# Patient Record
Sex: Female | Born: 1970 | Race: White | Hispanic: No | Marital: Married | Smoking: Never smoker
Health system: Southern US, Community
[De-identification: ages and names within clinical notes are randomized; demographics above are authoritative.]

## PROBLEM LIST (undated history)

## (undated) DIAGNOSIS — K219 Gastro-esophageal reflux disease without esophagitis: Secondary | ICD-10-CM

## (undated) DIAGNOSIS — K5900 Constipation, unspecified: Secondary | ICD-10-CM

## (undated) DIAGNOSIS — J302 Other seasonal allergic rhinitis: Secondary | ICD-10-CM

## (undated) DIAGNOSIS — Z9889 Other specified postprocedural states: Secondary | ICD-10-CM

## (undated) DIAGNOSIS — F419 Anxiety disorder, unspecified: Secondary | ICD-10-CM

## (undated) DIAGNOSIS — R112 Nausea with vomiting, unspecified: Secondary | ICD-10-CM

## (undated) DIAGNOSIS — K589 Irritable bowel syndrome without diarrhea: Secondary | ICD-10-CM

## (undated) DIAGNOSIS — T7840XA Allergy, unspecified, initial encounter: Secondary | ICD-10-CM

## (undated) HISTORY — PX: ABDOMINAL HYSTERECTOMY: SHX81

## (undated) HISTORY — DX: Other specified postprocedural states: R11.2

## (undated) HISTORY — DX: Other specified postprocedural states: Z98.890

## (undated) HISTORY — PX: COLONOSCOPY: SHX174

## (undated) HISTORY — PX: WISDOM TOOTH EXTRACTION: SHX21

## (undated) HISTORY — DX: Constipation, unspecified: K59.00

## (undated) HISTORY — DX: Allergy, unspecified, initial encounter: T78.40XA

## (undated) HISTORY — DX: Irritable bowel syndrome, unspecified: K58.9

## (undated) HISTORY — DX: Gastro-esophageal reflux disease without esophagitis: K21.9

## (undated) HISTORY — PX: TOTAL ABDOMINAL HYSTERECTOMY: SHX209

---

## 2001-07-13 ENCOUNTER — Emergency Department (HOSPITAL_COMMUNITY): Admission: EM | Admit: 2001-07-13 | Discharge: 2001-07-14 | Payer: Self-pay | Admitting: Emergency Medicine

## 2001-07-14 ENCOUNTER — Encounter: Payer: Self-pay | Admitting: Emergency Medicine

## 2002-06-01 ENCOUNTER — Emergency Department (HOSPITAL_COMMUNITY): Admission: EM | Admit: 2002-06-01 | Discharge: 2002-06-01 | Payer: Self-pay | Admitting: Emergency Medicine

## 2002-06-01 ENCOUNTER — Encounter: Payer: Self-pay | Admitting: Emergency Medicine

## 2015-02-28 ENCOUNTER — Encounter: Payer: Self-pay | Admitting: Physician Assistant

## 2015-02-28 ENCOUNTER — Ambulatory Visit (INDEPENDENT_AMBULATORY_CARE_PROVIDER_SITE_OTHER): Payer: Managed Care, Other (non HMO) | Admitting: Physician Assistant

## 2015-02-28 VITALS — BP 122/68 | HR 67 | Ht 65.5 in | Wt 175.0 lb

## 2015-02-28 DIAGNOSIS — Z131 Encounter for screening for diabetes mellitus: Secondary | ICD-10-CM | POA: Diagnosis not present

## 2015-02-28 DIAGNOSIS — Z23 Encounter for immunization: Secondary | ICD-10-CM | POA: Diagnosis not present

## 2015-02-28 DIAGNOSIS — Z0000 Encounter for general adult medical examination without abnormal findings: Secondary | ICD-10-CM | POA: Diagnosis not present

## 2015-02-28 DIAGNOSIS — Z13220 Encounter for screening for lipoid disorders: Secondary | ICD-10-CM | POA: Diagnosis not present

## 2015-02-28 DIAGNOSIS — R252 Cramp and spasm: Secondary | ICD-10-CM

## 2015-02-28 DIAGNOSIS — R10813 Right lower quadrant abdominal tenderness: Secondary | ICD-10-CM

## 2015-02-28 DIAGNOSIS — Z1239 Encounter for other screening for malignant neoplasm of breast: Secondary | ICD-10-CM

## 2015-02-28 NOTE — Progress Notes (Signed)
  Subjective:     Anna Robinson is a 44 y.o. female and is here for a comprehensive physical exam. The patient reports problems - pt has some occasional muscle cramps of lower extremities. not tried anything. drinks at least 1 gallon of water a day. she does exercise a lot. [name].  History   Social History  . Marital Status: Legally Separated    Spouse Name: N/A  . Number of Children: N/A  . Years of Education: N/A   Occupational History  . Not on file.   Social History Main Topics  . Smoking status: Never Smoker   . Smokeless tobacco: Not on file  . Alcohol Use: Not on file  . Drug Use: No  . Sexual Activity:    Partners: Female   Other Topics Concern  . Not on file   Social History Narrative  . No narrative on file   Health Maintenance  Topic Date Due  . HIV Screening  11/06/1984  . MAMMOGRAM  11/07/1987  . PAP SMEAR  11/07/1987  . INFLUENZA VACCINE  08/31/2015 (Originally 07/15/2014)  . TETANUS/TDAP  02/27/2025    The following portions of the patient's history were reviewed and updated as appropriate: allergies, current medications, past family history, past medical history, past social history, past surgical history and problem list.  Review of Systems A comprehensive review of systems was negative.   Objective:    BP 122/68 mmHg  Pulse 67  Ht 5' 5.5" (1.664 m)  Wt 175 lb (79.379 kg)  BMI 28.67 kg/m2 General appearance: alert, cooperative and appears stated age Head: Normocephalic, without obvious abnormality, atraumatic Eyes: conjunctivae/corneas clear. PERRL, EOM's intact. Fundi benign. Ears: normal TM's and external ear canals both ears Nose: Nares normal. Septum midline. Mucosa normal. No drainage or sinus tenderness. Throat: lips, mucosa, and tongue normal; teeth and gums normal Neck: no adenopathy, no carotid bruit, no JVD, supple, symmetrical, trachea midline and thyroid not enlarged, symmetric, no tenderness/mass/nodules Back: symmetric, no curvature. ROM  normal. No CVA tenderness. Lungs: clear to auscultation bilaterally Heart: regular rate and rhythm, S1, S2 normal, no murmur, click, rub or gallop Abdomen: soft, non-tender; bowel sounds normal; no masses,  no organomegaly tenderness over RLQ. No rebound or guarding.  Extremities: extremities normal, atraumatic, no cyanosis or edema Pulses: 2+ and symmetric Skin: Skin color, texture, turgor normal. No rashes or lesions Lymph nodes: Cervical, supraclavicular, and axillary nodes normal. Neurologic: Grossly normal    Assessment:    Healthy female exam.      Plan:    CPE- mammogram ordered. Tdap given today. Declined flu shot. Other vaccines up to date. Fasting labs ordered. Encouraged vitamin D800 units and Calcium 1200mg . Encouraged exercise.   RLQ tenderness- per pt off and on for 1 month. Nothing tried. Still has ovaries. S/P hysterectomy. Will get pelvic ultrasound for evaluation.  Muscle cramps-  Magnesium 500mg  daily.  Alka seltzer acute cramps epson salt soalks.  Will get labs as well.   See After Visit Summary for Counseling Recommendations

## 2015-02-28 NOTE — Patient Instructions (Addendum)
Will get mammogram.  Keeping You Healthy  Get These Tests 1. Blood Pressure- Have your blood pressure checked once a year by your health care provider.  Normal blood pressure is 120/80. 2. Weight- Have your body mass index (BMI) calculated to screen for obesity.  BMI is measure of body fat based on height and weight.  You can also calculate your own BMI at https://www.west-esparza.com/www.nhlbisupport.com/bmi/. 3. Cholesterol- Have your cholesterol checked every 5 years starting at age 420 then yearly starting at age 46. 4. Chlamydia, HIV, and other sexually transmitted diseases- Get screened every year until age 46, then within three months of each new sexual provider. 5. Pap Smear- Every 1-3 years; discuss with your health care provider. 6. Mammogram- Every year starting at age 46  Take these medicines  Calcium with Vitamin D-Your body needs 1200 mg of Calcium each day and [id number] IU of Vitamin D daily.  Your body can only absorb 500 mg of Calcium at a time so Calcium must be taken in 2 or 3 divided doses throughout the day.  Multivitamin with folic acid- Once daily if it is possible for you to become pregnant.  Get these Immunizations  Gardasil-Series of three doses; prevents HPV related illness such as genital warts and cervical cancer.  Menactra-Single dose; prevents meningitis.  Tetanus shot- Every 10 years.  Flu shot-Every year.  Take these steps 1. Do not smoke-Your healthcare provider can help you quit.  For tips on how to quit go to www.smokefree.gov or call 1-800 QUITNOW. 2. Be physically active- Exercise 5 days a week for at least 30 minutes.  If you are not already physically active, start slow and gradually work up to 30 minutes of moderate physical activity.  Examples of moderate activity include walking briskly, dancing, swimming, bicycling, etc. 3. Breast Cancer- A self breast exam every month is important for early detection of breast cancer.  For more information and instruction on self breast  exams, ask your healthcare provider or [place].eswww.womenshealth.gov/faq/breast-self-exam.cfm. 4. Eat a healthy diet- Eat a variety of healthy foods such as fruits, vegetables, whole grains, low fat milk, low fat cheeses, yogurt, lean meats, poultry and fish, beans, nuts, tofu, etc.  For more information go to www. Thenutritionsource.org 5. Drink alcohol in moderation- Limit alcohol intake to one drink or less per day. Never drink and drive. 6. Depression- Your emotional health is as important as your physical health.  If you're feeling down or losing interest in things you normally enjoy please talk to your healthcare provider about being screened for depression. 7. Dental visit- Brush and floss your teeth twice daily; visit your dentist twice a year. 8. Eye doctor- Get an eye exam at least every 2 years. 9. Helmet use- Always wear a helmet when riding a bicycle, motorcycle, rollerblading or skateboarding. 10. Safe sex- If you may be exposed to sexually transmitted infections, use a condom. 11. Seat belts- Seat belts can save your live; always wear one. 12. Smoke/Carbon Monoxide detectors- These detectors need to be installed on the appropriate level of your home. Replace batteries at least once a year. 13. Skin cancer- When out in the sun please cover up and use sunscreen 15 SPF or higher. 14. Violence- If anyone is threatening or hurting you, please tell your healthcare provider.      Magnesium 500mg  daily.  Alka seltzer acute cramps epson salt.

## 2015-03-01 LAB — CBC WITH DIFFERENTIAL/PLATELET
Basophils Absolute: 0.1 10*3/uL (ref 0.0–0.1)
Basophils Relative: 1 % (ref 0–1)
Eosinophils Absolute: 0.1 10*3/uL (ref 0.0–0.7)
Eosinophils Relative: 1 % (ref 0–5)
HCT: 43.5 % (ref 36.0–46.0)
Hemoglobin: 14.8 g/dL (ref 12.0–15.0)
Lymphocytes Relative: 40 % (ref 12–46)
Lymphs Abs: 3.2 10*3/uL (ref 0.7–4.0)
MCH: 29.8 pg (ref 26.0–34.0)
MCHC: 34 g/dL (ref 30.0–36.0)
MCV: 87.5 fL (ref 78.0–100.0)
MPV: 9.9 fL (ref 8.6–12.4)
Monocytes Absolute: 0.6 10*3/uL (ref 0.1–1.0)
Monocytes Relative: 7 % (ref 3–12)
Neutro Abs: 4.1 10*3/uL (ref 1.7–7.7)
Neutrophils Relative %: 51 % (ref 43–77)
Platelets: 256 10*3/uL (ref 150–400)
RBC: 4.97 MIL/uL (ref 3.87–5.11)
RDW: 13.9 % (ref 11.5–15.5)
WBC: 8 10*3/uL (ref 4.0–10.5)

## 2015-03-01 LAB — COMPLETE METABOLIC PANEL WITH GFR
ALT: 18 U/L (ref 0–35)
AST: 16 U/L (ref 0–37)
Albumin: 4.7 g/dL (ref 3.5–5.2)
Alkaline Phosphatase: 59 U/L (ref 39–117)
BUN: 10 mg/dL (ref 6–23)
CO2: 22 mEq/L (ref 19–32)
Calcium: 9 mg/dL (ref 8.4–10.5)
Chloride: 105 mEq/L (ref 96–112)
Creat: 0.78 mg/dL (ref 0.50–1.10)
GFR, Est African American: >89 mL/min
GFR, Est Non African American: >89 mL/min
Glucose, Bld: 95 mg/dL (ref 70–99)
Potassium: 3.7 mEq/L (ref 3.5–5.3)
Sodium: 137 mEq/L (ref 135–145)
Total Bilirubin: 1.7 mg/dL — ABNORMAL HIGH (ref 0.2–1.2)
Total Protein: 7.2 g/dL (ref 6.0–8.3)

## 2015-03-01 LAB — LIPID PANEL
Cholesterol: 172 mg/dL (ref 0–200)
HDL: 33 mg/dL — ABNORMAL LOW (ref >=46)
LDL Cholesterol: 116 mg/dL — ABNORMAL HIGH (ref 0–99)
Total CHOL/HDL Ratio: 5.2 Ratio
Triglycerides: 115 mg/dL (ref <150)
VLDL: 23 mg/dL (ref 0–40)

## 2015-03-01 LAB — TSH: TSH: 1.434 u[IU]/mL (ref 0.350–4.500)

## 2015-03-02 ENCOUNTER — Telehealth: Payer: Self-pay | Admitting: *Deleted

## 2015-03-02 DIAGNOSIS — R10813 Right lower quadrant abdominal tenderness: Secondary | ICD-10-CM | POA: Insufficient documentation

## 2015-03-02 DIAGNOSIS — R252 Cramp and spasm: Secondary | ICD-10-CM | POA: Insufficient documentation

## 2015-03-02 NOTE — Telephone Encounter (Signed)
-----   Message from Jomarie LongsJade L Breeback, [place]-C sent at 03/01/2015  8:11 PM EDT ----- Call pt: thyroid normal. Kidney, glucose, liver normal. HDL on low side increase with exercise. LDL goal under 100 but no risk factors look great. No anemia.

## 2015-03-02 NOTE — Telephone Encounter (Signed)
Called patient & gave lab results .[name].Direce

## 2015-03-20 ENCOUNTER — Other Ambulatory Visit: Payer: Self-pay | Admitting: Physician Assistant

## 2015-03-20 DIAGNOSIS — R102 Pelvic and perineal pain: Secondary | ICD-10-CM

## 2015-03-22 ENCOUNTER — Ambulatory Visit (INDEPENDENT_AMBULATORY_CARE_PROVIDER_SITE_OTHER): Payer: Managed Care, Other (non HMO)

## 2015-03-22 DIAGNOSIS — R102 Pelvic and perineal pain: Secondary | ICD-10-CM

## 2015-03-22 DIAGNOSIS — R10813 Right lower quadrant abdominal tenderness: Secondary | ICD-10-CM | POA: Diagnosis not present

## 2015-03-22 DIAGNOSIS — Z1231 Encounter for screening mammogram for malignant neoplasm of breast: Secondary | ICD-10-CM | POA: Diagnosis not present

## 2015-03-22 DIAGNOSIS — Z90710 Acquired absence of both cervix and uterus: Secondary | ICD-10-CM | POA: Diagnosis not present

## 2015-03-26 ENCOUNTER — Telehealth: Payer: Self-pay | Admitting: *Deleted

## 2015-03-26 NOTE — Telephone Encounter (Signed)
4/11 Anna Robinson called and left msg to return call. Note is under ulstrasound transvaginal.

## 2015-03-26 NOTE — Telephone Encounter (Signed)
Patient called looking for results on Radiology results & Pap.  Amber  & I didn't see any notes available to report.

## 2015-03-27 ENCOUNTER — Telehealth: Payer: Self-pay | Admitting: *Deleted

## 2015-03-27 NOTE — Telephone Encounter (Signed)
Spoke with  pt: ovaries look great. Nothing suspicious on pelvic ultrasound.

## 2015-07-09 ENCOUNTER — Encounter: Payer: Self-pay | Admitting: Emergency Medicine

## 2015-07-09 ENCOUNTER — Emergency Department (INDEPENDENT_AMBULATORY_CARE_PROVIDER_SITE_OTHER)
Admission: EM | Admit: 2015-07-09 | Discharge: 2015-07-09 | Disposition: A | Discharge status: Home / Self Care | Payer: Managed Care, Other (non HMO) | Source: Home / Self Care | Attending: Family Medicine | Admitting: Family Medicine

## 2015-07-09 DIAGNOSIS — R51 Headache: Secondary | ICD-10-CM

## 2015-07-09 DIAGNOSIS — R42 Dizziness and giddiness: Secondary | ICD-10-CM | POA: Diagnosis not present

## 2015-07-09 DIAGNOSIS — R519 Headache, unspecified: Secondary | ICD-10-CM

## 2015-07-09 DIAGNOSIS — F419 Anxiety disorder, unspecified: Secondary | ICD-10-CM | POA: Diagnosis not present

## 2015-07-09 HISTORY — DX: Other seasonal allergic rhinitis: J30.2

## 2015-07-09 HISTORY — DX: Anxiety disorder, unspecified: F41.9

## 2015-07-09 LAB — POCT FASTING CBG KUC MANUAL ENTRY: POCT Glucose (KUC): 124 mg/dL — AB (ref 70–99)

## 2015-07-09 MED ORDER — DEXAMETHASONE SODIUM PHOSPHATE 4 MG/ML IJ SOLN
8.0000 mg | Freq: Once | INTRAMUSCULAR | Status: AC
  Administered 2015-07-09: 8 mg via INTRAMUSCULAR

## 2015-07-09 MED ORDER — METOCLOPRAMIDE HCL 5 MG/ML IJ SOLN
10.0000 mg | Freq: Once | INTRAMUSCULAR | Status: AC
  Administered 2015-07-09: 10 mg via INTRAMUSCULAR

## 2015-07-09 NOTE — ED Provider Notes (Signed)
CSN: [id number]     Arrival date & time 07/09/15  1236 History   First MD Initiated Contact with Patient 07/09/15 1318     Chief Complaint  Patient presents with  . Headache  . Dizziness   (Consider location/radiation/quality/duration/timing/severity/associated sxs/prior Treatment) HPI Patient is a 44 year old female with history of seasonal allergies and anxiety presenting to urgent care with complaint of headache that started this morning when she woke up.  Patient states headache is diffuse and achy, moderate in severity with associated nausea, photophobia, and mild dizziness.  Headache feels similar to prior headaches, however is more persistent.  Patient has tried 2 tablets of Excedrin as well as 25 mg of Benadryl. Patient states she knows she has history of panic attacks and has become more anxious since going to her work where people were asking how she was doing.  Patient does report a fall 2 weeks ago due to leg cramps, however denies hitting her head.  States she was evaluated at that time started on Alka-Seltzer which did help with her leg cramps and headache had resolved at that time.  Patient states she does get frequent, almost daily headaches, but has not seen a neurologist for these "migraines."  She is not on any preventative migraine medication.  Denies weakness or numbness in arms or legs.  Denies difficulty walking.  Neck pain or stiffness.  Denies rashes.  Past Medical History  Diagnosis Date  . Seasonal allergies   . Anxiety    Past Surgical History  Procedure Laterality Date  . Abdominal hysterectomy     Family History  Problem Relation Age of Onset  . Heart disease Mother   . Diabetes Mother   . Hyperlipidemia Mother   . Hypertension Mother   . Stroke Mother   . Cancer Father   . Depression Maternal Aunt   . Heart disease Maternal Grandmother    History  Substance Use Topics  . Smoking status: Never Smoker   . Smokeless tobacco: Not on file  . Alcohol Use:  Not on file   OB History    No data available     Review of Systems  Constitutional: Negative for fever and chills.  HENT: Positive for congestion, ear pain ("congested" ), rhinorrhea, sinus pressure and sneezing. Negative for sore throat, trouble swallowing and voice change.   Respiratory: Positive for chest tightness. Negative for cough and shortness of breath.   Cardiovascular: Negative for chest pain and palpitations.  Gastrointestinal: Positive for nausea. Negative for vomiting, abdominal pain and diarrhea.  Genitourinary: Negative for dysuria, urgency, hematuria and flank pain.  Musculoskeletal: Positive for myalgias and arthralgias. Negative for back pain, joint swelling, gait problem, neck pain and neck stiffness.       Body aches  Neurological: Positive for dizziness and headaches. Negative for tremors, seizures, syncope, facial asymmetry, speech difficulty, weakness, light-headedness and numbness.  All other systems reviewed and are negative.   Allergies  Penicillins  Home Medications   Prior to Admission medications   Medication Sig Start Date End Date Taking? Authorizing Provider  diphenhydrAMINE (BENADRYL) 25 mg capsule Take 25 mg by mouth every 6 (six) hours as needed.   Yes Historical Provider, MD   BP 108/71 mmHg  Pulse 82  Temp(Src) 98.3 F (36.8 C) (Oral)  Resp 18  Ht  (1.676 m)  Wt 172 lb (78.019 kg)  BMI 27.77 kg/m2  SpO2 98% Physical Exam  Constitutional: She is oriented to person, place, and time. She appears  well-developed and well-nourished. No distress.  Mildly anxious appearing female in exam room, NAD  HENT:  Head: Normocephalic and atraumatic.  Eyes: Conjunctivae and EOM are normal. Pupils are equal, round, and reactive to light. No scleral icterus.  Neck: Normal range of motion. Neck supple.  No nuchal rigidity or meningeal signs.  Cardiovascular: Normal rate, regular rhythm and normal heart sounds.   Pulmonary/Chest: Effort normal and  breath sounds normal. No respiratory distress. She has no wheezes. She has no rales. She exhibits no tenderness.  Abdominal: Soft. Bowel sounds are normal. She exhibits no distension and no mass. There is no tenderness. There is no rebound and no guarding.  Musculoskeletal: Normal range of motion. She exhibits no edema or tenderness.  Neurological: She is alert and oriented to person, place, and time. She has normal strength. No cranial nerve deficit or sensory deficit. Gait normal. GCS eye subscore is 4. GCS verbal subscore is 5. GCS motor subscore is 6.  CN II-XII in tact. Speech is clear, alert to person, place and time. Able to follow 2 step commands. Normal finger to nose coordination. Normal gait.  Skin: Skin is warm and dry. No rash noted. She is not diaphoretic. No erythema.  Psychiatric: Her mood appears anxious.  Nursing note and vitals reviewed.   ED Course  Procedures (including critical care time) Labs Review Labs Reviewed  POCT FASTING CBG [facility] MANUAL ENTRY - Abnormal; Notable for the following:    POCT Glucose ([facility]) 124 (*)    All other components within normal limits    Imaging Review No results found.   MDM   1. Acute nonintractable headache, unspecified headache type   2. Dizziness   3. Anxiety   4. Recurrent headache    Patient is a 44 year old female presenting to urgent care with complaint of persistent headache that started this morning.  Patient has history of recurrent headaches.  Headache feels similar to prior.  No focal neuro deficits.  Patient did have fall 2 weeks ago but was evaluated since then and denies hitting head.  Patient is anxious, has history of panic attacks.  Doubt emergent process taking place at this time including SAH, meningitis, CVA, or other intracranial bleed or mass. No imaging indicated at this time. Tx in [facility]: Reglan 10mg  IM and Decadron 8mg  IM as pt had 2 tabs of Excedrin and 25mg  benadryl PTA.  2:20 PM Pt states her headache has  improved moderately. States it is still there but much improved.  Pt states dizziness as resolved. States she feels comfortable being discharged home. Advised to f/u with PCP next week if congestion and headaches do not improve. Advised she may need neurology consult if headaches become more frequent and severe.   Discussed symptoms that warrant emergent care in the ED. Patient verbalized understanding and agreement with treatment plan.    Junius Finner, [name] 07/09/15 1517

## 2015-07-09 NOTE — ED Notes (Signed)
Reports awakening this morning with headache: took excedrine migraine 2 tabs and benadryl 25 mg; now feels dizzy also. Very anxious.

## 2015-07-11 ENCOUNTER — Telehealth: Payer: Self-pay | Admitting: *Deleted

## 2015-07-13 ENCOUNTER — Telehealth: Payer: Self-pay | Admitting: Emergency Medicine

## 2015-09-20 ENCOUNTER — Emergency Department (INDEPENDENT_AMBULATORY_CARE_PROVIDER_SITE_OTHER)
Admission: EM | Admit: 2015-09-20 | Discharge: 2015-09-20 | Disposition: A | Discharge status: Home / Self Care | Payer: Managed Care, Other (non HMO) | Source: Home / Self Care | Attending: Emergency Medicine | Admitting: Emergency Medicine

## 2015-09-20 ENCOUNTER — Encounter: Payer: Self-pay | Admitting: Emergency Medicine

## 2015-09-20 DIAGNOSIS — F410 Panic disorder [episodic paroxysmal anxiety] without agoraphobia: Secondary | ICD-10-CM

## 2015-09-20 DIAGNOSIS — R5383 Other fatigue: Secondary | ICD-10-CM | POA: Diagnosis not present

## 2015-09-20 MED ORDER — FLUTICASONE PROPIONATE 50 MCG/ACT NA SUSP: NASAL | Status: DC

## 2015-09-20 NOTE — Discharge Instructions (Signed)
Panic Attacks °Panic attacks are sudden, short-lived surges of severe anxiety, fear, or discomfort. They may occur for no reason when you are relaxed, when you are anxious, or when you are sleeping. Panic attacks may occur for a number of reasons:  °· Healthy people occasionally have panic attacks in extreme, life-threatening situations, such as war or natural disasters. Normal anxiety is a protective mechanism of the body that helps us react to danger (fight or flight response). °· Panic attacks are often seen with anxiety disorders, such as panic disorder, social anxiety disorder, generalized anxiety disorder, and phobias. Anxiety disorders cause excessive or uncontrollable anxiety. They may interfere with your relationships or other life activities. °· Panic attacks are sometimes seen with other mental illnesses, such as depression and posttraumatic stress disorder. °· Certain medical conditions, prescription medicines, and drugs of abuse can cause panic attacks. °SYMPTOMS  °Panic attacks start suddenly, peak within 20 minutes, and are accompanied by four or more of the following symptoms: °· Pounding heart or fast heart rate (palpitations). °· Sweating. °· Trembling or shaking. °· Shortness of breath or feeling smothered. °· Feeling choked. °· Chest pain or discomfort. °· Nausea or strange feeling in your stomach. °· Dizziness, light-headedness, or feeling like you will faint. °· Chills or hot flushes. °· Numbness or tingling in your lips or hands and feet. °· Feeling that things are not real or feeling that you are not yourself. °· Fear of losing control or going crazy. °· Fear of dying. °Some of these symptoms can mimic serious medical conditions. For example, you may think you are having a heart attack. Although panic attacks can be very scary, they are not life threatening. °DIAGNOSIS  °Panic attacks are diagnosed through an assessment by your health care provider. Your health care provider will ask  questions about your symptoms, such as where and when they occurred. Your health care provider will also ask about your medical history and use of alcohol and drugs, including prescription medicines. Your health care provider may order blood tests or other studies to rule out a serious medical condition. Your health care provider may refer you to a mental health professional for further evaluation. °TREATMENT  °· Most healthy people who have one or two panic attacks in an extreme, life-threatening situation will not require treatment. °· The treatment for panic attacks associated with anxiety disorders or other mental illness typically involves counseling with a mental health professional, medicine, or a combination of both. Your health care provider will help determine what treatment is best for you. °· Panic attacks due to physical illness usually go away with treatment of the illness. If prescription medicine is causing panic attacks, talk with your health care provider about stopping the medicine, decreasing the dose, or substituting another medicine. °· Panic attacks due to alcohol or drug abuse go away with abstinence. Some adults need professional help in order to stop drinking or using drugs. °HOME CARE INSTRUCTIONS  °· Take all medicines as directed by your health care provider.   °· Schedule and attend follow-up visits as directed by your health care provider. It is important to keep all your appointments. °SEEK MEDICAL CARE IF: °· You are not able to take your medicines as prescribed. °· Your symptoms do not improve or get worse. °SEEK IMMEDIATE MEDICAL CARE IF:  °· You experience panic attack symptoms that are different than your usual symptoms. °· You have serious thoughts about hurting yourself or others. °· You are taking medicine for panic attacks and   have a serious side effect. MAKE SURE YOU:  Understand these instructions.  Will watch your condition.  Will get help right away if you are not  doing well or get worse.  For sinuses, try to wean off Afrin, instead use Flonase as prescribed

## 2015-09-20 NOTE — ED Notes (Signed)
Chest tightness after eating a sucker today about one hour ago. Said she went white as a ghost, felt slightly dizzy, SOB and chest felt tight. Used Afrin felt a little better.

## 2015-09-20 NOTE — ED Provider Notes (Addendum)
CSN: [id number]     Arrival date & time 09/20/15  1334 History   First MD Initiated Contact with Patient 09/20/15 1344     Chief Complaint  Patient presents with  . Chest Pain    HPI At work today at about 1 PM, she was eating a lollipop and felt acutely shortness of breath, dizzy, vague moderate anterior chest tightness without radiation or loss of consciousness. She felt anxious and she felt like it was a panic attack. She looked at herself in the mirror and she was "white as a ghost". After a few minutes, her color improved. No loss of consciousness. Denies vertigo. No blood loss symptoms. Has some associated shortness of breath but that resolved. Denies chance of pregnancy. Status post hysterectomy in the past. Denies nausea or vomiting or fever or chills. Has chronic nasal congestion and she admits to using Afrin chronically and may be using it more in the past week or so with sinus allergy flareup the past week. Denies discolored rhinorrhea or nasal bleeding. Currently denies any chest pain. Denies any palpitations. No arm pain. Past Medical History  Diagnosis Date  . Seasonal allergies   . Anxiety    Past Surgical History  Procedure Laterality Date  . Abdominal hysterectomy     Family History  Problem Relation Age of Onset  . Heart disease Mother   . Diabetes Mother   . Hyperlipidemia Mother   . Hypertension Mother   . Stroke Mother   . Cancer Father   . Depression Maternal Aunt   . Heart disease Maternal Grandmother    Social History  Substance Use Topics  . Smoking status: Never Smoker   . Smokeless tobacco: None  . Alcohol Use: None   OB History    No data available     Review of Systems  All other systems reviewed and are negative.   Allergies  Penicillins  Home Medications   Prior to Admission medications   Medication Sig Start Date End Date Taking? Authorizing Provider  oxymetazoline (AFRIN) 0.05 % nasal spray Place 1 spray into both nostrils 2  (two) times daily.   Yes Historical Provider, MD  potassium & sodium phosphates (PHOS-NAK) 280-160-250 MG PACK Take 1 packet by mouth 4 (four) times daily -  with meals and at bedtime.   Yes Historical Provider, MD  ranitidine (ZANTAC) 150 MG capsule Take 150 mg by mouth 2 (two) times daily.   Yes Historical Provider, MD  diphenhydrAMINE (BENADRYL) 25 mg capsule Take 25 mg by mouth every 6 (six) hours as needed.    Historical Provider, MD  fluticasone Aleda Grana) 50 MCG/ACT nasal spray 1 or 2 sprays each nostril twice a day 09/20/15   Lajean Manes, MD   Meds Ordered and Administered this Visit  Medications - No data to display  BP 117/83 mmHg  Pulse 77  Temp(Src) 97.9 F (36.6 C) (Oral)  Ht  (1.676 m)  Wt 179 lb (81.194 kg)  BMI 28.91 kg/m2  SpO2 100% No data found.   Physical Exam  Constitutional: She is oriented to person, place, and time. She appears well-developed and well-nourished. No distress.  HENT:  Head: Normocephalic and atraumatic.  Mouth/Throat: Oropharynx is clear and moist.  Nose: Mildly congested, pale boggy turbinates.  Eyes: Conjunctivae and EOM are normal. Pupils are equal, round, and reactive to light. No scleral icterus.  Neck: Normal range of motion. No JVD present. No thyromegaly present.  Cardiovascular: Normal rate and normal heart sounds.  Exam reveals no gallop and no friction rub.   No murmur heard. Pulmonary/Chest: Effort normal and breath sounds normal. No respiratory distress. She has no wheezes. She has no rales. She exhibits no tenderness.  Abdominal: She exhibits no distension. There is no tenderness.  Musculoskeletal: Normal range of motion.  Lymphadenopathy:    She has no cervical adenopathy.  Neurological: She is alert and oriented to person, place, and time. No cranial nerve deficit. She displays a negative Romberg sign. Gait normal.  Motor and sensory bilateral intact  Skin: Skin is warm. No rash noted.  Psychiatric: She has a normal mood  and affect.  Mildly anxious  Nursing note and vitals reviewed.   ED Course  Procedures (including critical care time)  Labs Review Labs Reviewed  TSH  COMPLETE METABOLIC PANEL WITH GFR  CBC    MDM   1. Other fatigue   2. Panic attack     Over 25 minutes spent, greater than 50% of the time spent for counseling and coordination of care.  Explained to her that physical exam within normal limits except for sinus congestion but no evidence of infection. Cardiorespiratory exam normal. She declined any EKG or chest x-ray. She requested labs, so we drew CMP and CBC and TSH advised her to make an appointment with her PCP within 3-4 days to review those labs and discuss possible treatment for panic attack symptoms. Advised her to wean off frequent Afrin usage which could be a factor in her symptoms. Advised her to instead use Flonase as prescribed which could help sinus allergy symptoms with much less chance of side effects. Red flags discussed. See detailed Instructions in AVS, which were given to patient. Verbal instructions also given. Risks, benefits, and alternatives of treatment options discussed. Questions invited and answered. Patient voiced understanding and agreement with plans.   Lajean Manes, MD 09/20/15 1430  Lajean Manes, MD 09/20/15 [phone]

## 2015-09-21 ENCOUNTER — Telehealth: Payer: Self-pay

## 2015-09-21 LAB — COMPLETE METABOLIC PANEL WITH GFR
ALT: 35 U/L — ABNORMAL HIGH (ref 6–29)
AST: 24 U/L (ref 10–35)
Albumin: 4.7 g/dL (ref 3.6–5.1)
Alkaline Phosphatase: 75 U/L (ref 33–115)
BUN: 14 mg/dL (ref 7–25)
CO2: 27 mmol/L (ref 20–31)
Calcium: 9.9 mg/dL (ref 8.6–10.2)
Chloride: 102 mmol/L (ref 98–110)
Creat: 0.85 mg/dL (ref 0.50–1.10)
GFR, Est African American: >89 mL/min (ref >=60)
GFR, Est Non African American: 83 mL/min (ref >=60)
Glucose, Bld: 90 mg/dL (ref 65–99)
Potassium: 3.9 mmol/L (ref 3.5–5.3)
Sodium: 138 mmol/L (ref 135–146)
Total Bilirubin: 1.1 mg/dL (ref 0.2–1.2)
Total Protein: 7.3 g/dL (ref 6.1–8.1)

## 2015-09-21 LAB — CBC
HCT: 42.5 % (ref 36.0–46.0)
Hemoglobin: 14.7 g/dL (ref 12.0–15.0)
MCH: 29.9 pg (ref 26.0–34.0)
MCHC: 34.6 g/dL (ref 30.0–36.0)
MCV: 86.6 fL (ref 78.0–100.0)
MPV: 10 fL (ref 8.6–12.4)
Platelets: 242 10*3/uL (ref 150–400)
RBC: 4.91 MIL/uL (ref 3.87–5.11)
RDW: 13.5 % (ref 11.5–15.5)
WBC: 6.7 10*3/uL (ref 4.0–10.5)

## 2015-09-21 LAB — TSH: TSH: 2.127 u[IU]/mL (ref 0.350–4.500)

## 2016-12-05 ENCOUNTER — Ambulatory Visit: Payer: Managed Care, Other (non HMO) | Admitting: Physician Assistant

## 2016-12-23 ENCOUNTER — Encounter: Payer: Managed Care, Other (non HMO) | Admitting: Physician Assistant

## 2017-01-19 ENCOUNTER — Encounter: Payer: Self-pay | Admitting: Physician Assistant

## 2017-01-19 ENCOUNTER — Ambulatory Visit (INDEPENDENT_AMBULATORY_CARE_PROVIDER_SITE_OTHER): Payer: Managed Care, Other (non HMO) | Admitting: Physician Assistant

## 2017-01-19 VITALS — BP 104/68 | HR 56 | Ht 66.0 in | Wt 168.0 lb

## 2017-01-19 DIAGNOSIS — Z0000 Encounter for general adult medical examination without abnormal findings: Secondary | ICD-10-CM

## 2017-01-19 DIAGNOSIS — R1012 Left upper quadrant pain: Secondary | ICD-10-CM | POA: Diagnosis not present

## 2017-01-19 DIAGNOSIS — Z13220 Encounter for screening for lipoid disorders: Secondary | ICD-10-CM

## 2017-01-19 DIAGNOSIS — R140 Abdominal distension (gaseous): Secondary | ICD-10-CM

## 2017-01-19 DIAGNOSIS — Z131 Encounter for screening for diabetes mellitus: Secondary | ICD-10-CM | POA: Diagnosis not present

## 2017-01-19 MED ORDER — MELOXICAM 15 MG PO TABS: 15.0000 mg | ORAL_TABLET | Freq: Every day | ORAL | 1 refills | Status: DC

## 2017-01-19 MED ORDER — CYCLOBENZAPRINE HCL 10 MG PO TABS: 10.0000 mg | ORAL_TABLET | Freq: Three times a day (TID) | ORAL | 0 refills | Status: DC | PRN

## 2017-01-19 MED ORDER — LINACLOTIDE 290 MCG PO CAPS: 290.0000 ug | ORAL_CAPSULE | Freq: Every day | ORAL | 0 refills | Status: DC

## 2017-01-19 NOTE — Patient Instructions (Signed)

## 2017-01-20 LAB — COMPLETE METABOLIC PANEL WITH GFR
ALT: 49 U/L — AB (ref 6–29)
AST: 59 U/L — ABNORMAL HIGH (ref 10–35)
Albumin: 4.3 g/dL (ref 3.6–5.1)
Alkaline Phosphatase: 66 U/L (ref 33–115)
BUN: 9 mg/dL (ref 7–25)
CHLORIDE: 105 mmol/L (ref 98–110)
CO2: 25 mmol/L (ref 20–31)
CREATININE: 0.85 mg/dL (ref 0.50–1.10)
Calcium: 8.9 mg/dL (ref 8.6–10.2)
GFR, EST NON AFRICAN AMERICAN: 82 mL/min (ref >=60)
GLUCOSE: 88 mg/dL (ref 65–99)
Potassium: 3.9 mmol/L (ref 3.5–5.3)
SODIUM: 140 mmol/L (ref 135–146)
Total Bilirubin: 0.8 mg/dL (ref 0.2–1.2)
Total Protein: 6.5 g/dL (ref 6.1–8.1)

## 2017-01-20 LAB — LIPID PANEL
Cholesterol: 152 mg/dL (ref <200)
HDL: 39 mg/dL — ABNORMAL LOW (ref >50)
LDL CALC: 94 mg/dL (ref <100)
Total CHOL/HDL Ratio: 3.9 Ratio (ref <5.0)
Triglycerides: 97 mg/dL (ref <150)
VLDL: 19 mg/dL (ref <30)

## 2017-01-20 LAB — H. PYLORI BREATH TEST: H. pylori Breath Test: NOT DETECTED

## 2017-01-20 LAB — VITAMIN B12: VITAMIN B 12: 658 pg/mL (ref 200–1100)

## 2017-01-20 LAB — TSH: TSH: 1.83 mIU/L

## 2017-01-20 NOTE — Progress Notes (Signed)
Call pt: h.pylori is negative.

## 2017-01-21 LAB — VITAMIN D 25 HYDROXY (VIT D DEFICIENCY, FRACTURES): VIT D 25 HYDROXY: 65 ng/mL (ref 30–100)

## 2017-01-21 NOTE — Progress Notes (Signed)
Call pt: vitamin D is great. B12 wonderful.  Cholesterol great and improved some.  Kidney and glucose look great.  Liver enzymes up a little. Consider holding any tylenol products and alcohol for 2 weeks and recheck.

## 2017-01-22 ENCOUNTER — Encounter: Payer: Self-pay | Admitting: Physician Assistant

## 2017-01-22 NOTE — Progress Notes (Signed)
Subjective:     Anna Robinson is a 46 y.o. female and is here for a comprehensive physical exam. The patient reports problems - pt has hx of GERD. she has some persisent LUQ discomfort and bloating. she is concerned about h.pylori. her friend tested positive with the same symptoms. .  Social History   Social History  . Marital status: Single    Spouse name: N/A  . Number of children: N/A  . Years of education: N/A   Occupational History  . Not on file.   Social History Main Topics  . Smoking status: Never Smoker  . Smokeless tobacco: Never Used  . Alcohol use Not on file  . Drug use: No  . Sexual activity: Yes    Partners: Female   Other Topics Concern  . Not on file   Social History Narrative  . No narrative on file   Health Maintenance  Topic Date Due  . HIV Screening  11/06/1984  . MAMMOGRAM  03/21/2016  . PAP SMEAR  03/01/2020 (Originally 11/06/1990)  . INFLUENZA VACCINE  01/19/2022 (Originally 07/15/2016)  . TETANUS/TDAP  02/27/2025    The following portions of the patient's history were reviewed and updated as appropriate: allergies, current medications, past family history, past medical history, past social history, past surgical history and problem list.  Review of Systems Pertinent items noted in HPI and remainder of comprehensive ROS otherwise negative.   Objective:    BP 104/68   Pulse (!) 56   Ht 5\' 6"  (1.676 m)   Wt 168 lb (76.2 kg)   BMI 27.12 kg/m  General appearance: alert, cooperative and appears stated age Head: Normocephalic, without obvious abnormality, atraumatic Eyes: conjunctivae/corneas clear. PERRL, EOM's intact. Fundi benign. Ears: normal TM's and external ear canals both ears Nose: Nares normal. Septum midline. Mucosa normal. No drainage or sinus tenderness. Throat: lips, mucosa, and tongue normal; teeth and gums normal Neck: no adenopathy, no carotid bruit, no JVD, supple, symmetrical, trachea midline and thyroid not enlarged,  symmetric, no tenderness/mass/nodules Back: symmetric, no curvature. ROM normal. No CVA tenderness. Lungs: clear to auscultation bilaterally Heart: regular rate and rhythm, S1, S2 normal, no murmur, click, rub or gallop Abdomen: LUQ tenderness. no guarding or rebound.  Extremities: extremities normal, atraumatic, no cyanosis or edema Pulses: 2+ and symmetric Skin: Skin color, texture, turgor normal. No rashes or lesions Lymph nodes: Cervical, supraclavicular, and axillary nodes normal. Neurologic: Alert and oriented X 3, normal strength and tone. Normal symmetric reflexes. Normal coordination and gait    Assessment:    Healthy female exam.      Plan:    Marland KitchenMarland KitchenApril was seen today for annual exam.  Diagnoses and all orders for this visit:  Routine physical examination -     Lipid panel -     COMPLETE METABOLIC PANEL WITH GFR -     TSH -     VITAMIN D 25 Hydroxy (Vit-D Deficiency, Fractures) -     B12  Screening for lipid disorders -     Lipid panel  Screening for diabetes mellitus -     COMPLETE METABOLIC PANEL WITH GFR  LUQ discomfort -     H. pylori breath test  Bloating -     H. pylori breath test  Other orders -     meloxicam (MOBIC) 15 MG tablet; Take 1 tablet (15 mg total) by mouth daily. -     cyclobenzaprine (FLEXERIL) 10 MG tablet; Take 1 tablet (10 mg total) by mouth  3 (three) times daily as needed for muscle spasms. -     linaclotide (LINZESS) 290 MCG CAPS capsule; Take 1 capsule (290 mcg total) by mouth daily before breakfast.   Discussed constipation can cause bloating. linzess sent.  Continue PPI.  Discussed mobic can make GI symptoms worse. Only use as needed.  Follow up separate visit to discuss See After Visit Summary for Counseling Recommendations

## 2017-08-06 ENCOUNTER — Emergency Department
Admission: EM | Admit: 2017-08-06 | Discharge: 2017-08-06 | Disposition: A | Discharge status: Home / Self Care | Payer: Managed Care, Other (non HMO) | Source: Home / Self Care | Attending: Family Medicine | Admitting: Family Medicine

## 2017-08-06 ENCOUNTER — Encounter: Payer: Self-pay | Admitting: *Deleted

## 2017-08-06 DIAGNOSIS — J069 Acute upper respiratory infection, unspecified: Secondary | ICD-10-CM

## 2017-08-06 DIAGNOSIS — B9789 Other viral agents as the cause of diseases classified elsewhere: Secondary | ICD-10-CM | POA: Diagnosis not present

## 2017-08-06 MED ORDER — BENZONATATE 200 MG PO CAPS: ORAL_CAPSULE | ORAL | 0 refills | Status: DC

## 2017-08-06 MED ORDER — AZITHROMYCIN 250 MG PO TABS: ORAL_TABLET | ORAL | 0 refills | Status: DC

## 2017-08-06 MED ORDER — GUAIFENESIN-CODEINE 100-10 MG/5ML PO SOLN: ORAL | 0 refills | Status: DC

## 2017-08-06 NOTE — ED Triage Notes (Signed)
Patient c/o productive cough x 1 week. Sputums is now yellow and green. Sore throat x yesterday. Afebrile. Used Mucinex and Aleve itc. Son and grandchild with sinus and ear infections.

## 2017-08-06 NOTE — ED Provider Notes (Signed)
[name]    CSN: [id number] Arrival date & time: 08/06/17  1721     History   Chief Complaint Chief Complaint  Patient presents with  . Cough  . Sore Throat    HPI Anna Robinson is a 46 y.o. female.   Patient complains of five day history of typical cold-like symptoms developing over several days, including sore throat, sinus congestion, headache, fatigue, and cough.  She has had chills but no fever.  Her cough is worse at night and awakens her.   The history is provided by the patient.    Past Medical History:  Diagnosis Date  . Anxiety   . Seasonal allergies     Patient Active Problem List   Diagnosis Date Noted  . Muscle cramps 03/02/2015  . Right lower quadrant abdominal tenderness 03/02/2015    Past Surgical History:  Procedure Laterality Date  . ABDOMINAL HYSTERECTOMY      OB History    No data available       Home Medications    Prior to Admission medications   Medication Sig Start Date End Date Taking? Authorizing Provider  azithromycin (ZITHROMAX Z-PAK) 250 MG tablet Take 2 tabs today; then begin one tab once daily for 4 more days. (Rx void after 08/14/17) 08/06/17   Lattie Haw, MD  benzonatate (TESSALON) 200 MG capsule Take one cap by mouth each morning as needed for cough. 08/06/17   Lattie Haw, MD  cyclobenzaprine (FLEXERIL) 10 MG tablet Take 1 tablet (10 mg total) by mouth 3 (three) times daily as needed for muscle spasms. 01/19/17   Jomarie Longs, [name]  diphenhydrAMINE (BENADRYL) 25 mg capsule Take 25 mg by mouth every 6 (six) hours as needed.    [provider]  fluticasone Aleda Grana) 50 MCG/ACT nasal spray 1 or 2 sprays each nostril twice a day 09/20/15   Lajean Manes, MD  guaiFENesin-codeine 100-10 MG/5ML syrup Take 10mL by mouth at bedtime as needed for cough 08/06/17   Lattie Haw, MD  linaclotide [facility]) 290 MCG CAPS capsule Take 1 capsule (290 mcg total) by mouth daily before breakfast. 01/19/17    Tandy Gaw L, [name]  meloxicam (MOBIC) 15 MG tablet Take 1 tablet (15 mg total) by mouth daily. 01/19/17   Tandy Gaw L, [name]  potassium & sodium phosphates (PHOS-NAK) 280-160-250 MG PACK Take 1 packet by mouth 4 (four) times daily -  with meals and at bedtime.    [provider]  ranitidine (ZANTAC) 150 MG capsule Take 150 mg by mouth 2 (two) times daily.    [provider]    Family History Family History  Problem Relation Age of Onset  . Heart disease Mother   . Diabetes Mother   . Hyperlipidemia Mother   . Hypertension Mother   . Stroke Mother   . Cancer Father   . Depression Maternal Aunt   . Heart disease Maternal Grandmother     Social History Social History  Substance Use Topics  . Smoking status: Never Smoker  . Smokeless tobacco: Never Used  . Alcohol use Not on file     Allergies   Penicillins   Review of Systems Review of Systems + sore throat + cough No pleuritic pain, but feels tightness in anterior chest. No wheezing + nasal congestion + post-nasal drainage No sinus pain/pressure No itchy/red eyes No earache No hemoptysis No SOB No fever, + chills No nausea No vomiting No abdominal pain No diarrhea No  urinary symptoms No skin rash + fatigue + myalgias + headache Used OTC meds without relief   Physical Exam Triage Vital Signs ED Triage Vitals [08/06/17 1745]  Enc Vitals Group     BP 122/76     Pulse Rate (!) 52     Resp      Temp 98 F (36.7 C)     Temp Source Oral     SpO2 98 %     Weight 179 lb (81.2 kg)     Height      Head Circumference      Peak Flow      Pain Score 8     Pain Loc      Pain Edu?      Excl. in GC?    No data found.   Updated Vital Signs BP 122/76 (BP Location: Left Arm)   Pulse (!) 52   Temp 98 F (36.7 C) (Oral)   Wt 179 lb (81.2 kg)   SpO2 98%   BMI 28.89 kg/m   Visual Acuity Right Eye Distance:   Left Eye Distance:   Bilateral Distance:    Right Eye Near:   Left  Eye Near:    Bilateral Near:     Physical Exam Nursing notes and Vital Signs reviewed. Appearance:  Patient appears stated age, and in no acute distress Eyes:  Pupils are equal, round, and reactive to light and accomodation.  Extraocular movement is intact.  Conjunctivae are not inflamed  Ears:  Canals normal.  Tympanic membranes normal.  Nose:  Mildly congested turbinates.  No sinus tenderness.   Pharynx:  Normal Neck:  Supple.  Enlarged posterior/lateral nodes are palpated bilaterally, tender to palpation on the left.   Lungs:  Clear to auscultation.  Breath sounds are equal.  Moving air well. Heart:  Regular rate and rhythm without murmurs, rubs, or gallops.  Abdomen:  Nontender without masses or hepatosplenomegaly.  Bowel sounds are present.  No CVA or flank tenderness.  Extremities:  No edema.  Skin:  No rash present.    UC Treatments / Results  Labs (all labs ordered are listed, but only abnormal results are displayed) Labs Reviewed - No data to display  EKG  EKG Interpretation None       Radiology No results found.  Procedures Procedures (including critical care time)  Medications Ordered in UC Medications - No data to display   Initial Impression / Assessment and Plan / UC Course  I have reviewed the triage vital signs and the nursing notes.  Pertinent labs & imaging results that were available during my care of the patient were reviewed by me and considered in my medical decision making (see chart for details).    There is no evidence of bacterial infection today.   Rx for Robitussin AC for night time cough.  Controlled Substance Prescriptions I have consulted the [place] Controlled Substances Registry for this patient, and feel the risk/benefit ratio today is favorable for proceeding with this prescription for a controlled substance.  Rx for Tessalon for occasional morning use. Take plain guaifenesin (1200mg  extended release tabs such as Mucinex) twice daily,  with plenty of water, for cough and congestion.  May add Pseudoephedrine (30mg , one or two every 4 to 6 hours) for sinus congestion.  Get adequate rest.   May use Afrin nasal spray (or generic oxymetazoline) each morning for about 5 days and then discontinue.  Also recommend using saline nasal spray several times daily and saline  nasal irrigation (AYR is a common brand).   Try warm salt water gargles for sore throat.  Stop all antihistamines for now, and other non-prescription cough/cold preparations. May take Ibuprofen 200mg , 4 tabs every 8 hours with food for sore throat and chest/sternum discomfort. Begin Azithromycin if not improving about one week or if persistent fever develops (Given a prescription to hold, with an expiration date)  Follow-up with family doctor if not improving about10 days.     Final Clinical Impressions(s) / UC Diagnoses   Final diagnoses:  Viral URI with cough    New Prescriptions New Prescriptions   AZITHROMYCIN (ZITHROMAX Z-PAK) 250 MG TABLET    Take 2 tabs today; then begin one tab once daily for 4 more days. (Rx void after 08/14/17)   BENZONATATE (TESSALON) 200 MG CAPSULE    Take one cap by mouth each morning as needed for cough.   GUAIFENESIN-CODEINE 100-10 MG/5ML SYRUP    Take 10mL by mouth at bedtime as needed for cough        Lattie Haw, MD 08/06/17 [phone]

## 2017-08-06 NOTE — Discharge Instructions (Signed)
Take plain guaifenesin (1200mg  extended release tabs such as Mucinex) twice daily, with plenty of water, for cough and congestion.  May add Pseudoephedrine (30mg , one or two every 4 to 6 hours) for sinus congestion.  Get adequate rest.   May use Afrin nasal spray (or generic oxymetazoline) each morning for about 5 days and then discontinue.  Also recommend using saline nasal spray several times daily and saline nasal irrigation (AYR is a common brand).   Try warm salt water gargles for sore throat.  Stop all antihistamines for now, and other non-prescription cough/cold preparations. May take Ibuprofen 200mg , 4 tabs every 8 hours with food for sore throat and chest/sternum discomfort. Begin Azithromycin if not improving about one week or if persistent fever develops   Follow-up with family doctor if not improving about10 days.

## 2018-01-12 ENCOUNTER — Encounter: Payer: Self-pay | Admitting: Osteopathic Medicine

## 2018-01-12 ENCOUNTER — Ambulatory Visit: Payer: Managed Care, Other (non HMO) | Admitting: Osteopathic Medicine

## 2018-01-12 VITALS — BP 116/71 | HR 66 | Temp 97.9°F | Wt 186.0 lb

## 2018-01-12 DIAGNOSIS — J018 Other acute sinusitis: Secondary | ICD-10-CM | POA: Diagnosis not present

## 2018-01-12 DIAGNOSIS — H6981 Other specified disorders of Eustachian tube, right ear: Secondary | ICD-10-CM

## 2018-01-12 MED ORDER — CEFDINIR 300 MG PO CAPS: 300.0000 mg | ORAL_CAPSULE | Freq: Two times a day (BID) | ORAL | 0 refills | Status: DC

## 2018-01-12 MED ORDER — IPRATROPIUM BROMIDE 0.06 % NA SOLN: 2.0000 | Freq: Four times a day (QID) | NASAL | 1 refills | Status: DC

## 2018-01-12 MED ORDER — IBUPROFEN 800 MG PO TABS
800.0000 mg | ORAL_TABLET | Freq: Four times a day (QID) | ORAL | 1 refills | Status: DC | PRN
Start: 2018-01-12 — End: 2019-06-02

## 2018-01-12 NOTE — Patient Instructions (Signed)
Over-the-Counter Medications & Home Remedies for Upper Respiratory Illness  Note: the following list assumes no pregnancy, normal liver & kidney function and no other drug interactions. Dr. Niamya Vittitow has highlighted medications which are safe for you to use, but these may not be appropriate for everyone. Always ask a pharmacist or qualified medical provider if you have any questions!   Aches/Pains, Fever, Headache Acetaminophen (Tylenol) 500 mg tablets - take max 2 tablets (1000 mg) every 6 hours (4 times per day)  Ibuprofen (Motrin) 200 mg tablets - take max 4 tablets (800 mg) every 6 hours*  Sinus Congestion Prescription Atrovent as directed Nasal Saline if desired Phenylephrine (Sudafed) 10 mg tablets every 4 hours (or the 12-hour formulation)* Diphenhydramine (Benadryl) 25 mg tablets - take max 2 tablets every 4 hours  Cough & Sore Throat Dextromethorphan (Robitussin, others) - cough suppressant Guaifenesin (Robitussin, Mucinex, others) - expectorant (helps cough up mucus) (Dextromethorphan and Guaifenesin also come in a combination tablet) Lozenges w/ Benzocaine + Menthol (Cepacol) Honey - as much as you want! Teas which "coat the throat" - look for ingredients Elm Bark, Licorice Root, Marshmallow Root  Other Antibiotics if these are prescribed - take ALL, even if you're feeling better  Zinc Lozenges within 24 hours of symptoms onset - mixed evidence this shortens the duration of the common cold Don't waste your money on Vitamin C or Echinacea  *Caution in patients with high blood pressure    

## 2018-01-12 NOTE — Progress Notes (Signed)
HPI: Anna Robinson is a 47 y.o. female who  has a past medical history of Anxiety and Seasonal allergies.  she presents to [facility] today, 01/12/18,  for chief complaint of: Ear Pain  Throbbing R ear pain <24 hours, started last night. Nasal congestion for amost a week. Sudafed, Alka-Seltzer helping a little bit. Assoc w/ some dizziness, hx vertigo    Past medical, surgical, social and family history reviewed:  Patient Active Problem List   Diagnosis Date Noted  . Muscle cramps 03/02/2015  . Right lower quadrant abdominal tenderness 03/02/2015    Past Surgical History:  Procedure Laterality Date  . ABDOMINAL HYSTERECTOMY      Social History   Tobacco Use  . Smoking status: Never Smoker  . Smokeless tobacco: Never Used  Substance Use Topics  . Alcohol use: Not on file    Family History  Problem Relation Age of Onset  . Heart disease Mother   . Diabetes Mother   . Hyperlipidemia Mother   . Hypertension Mother   . Stroke Mother   . Cancer Father   . Depression Maternal Aunt   . Heart disease Maternal Grandmother      Current medication list and allergy/intolerance information reviewed:    Current Outpatient Medications  Medication Sig Dispense Refill  . diphenhydrAMINE (BENADRYL) 25 mg capsule Take 25 mg by mouth every 6 (six) hours as needed.    . fluticasone (FLONASE) 50 MCG/ACT nasal spray 1 or 2 sprays each nostril twice a day 16 g 0  . azithromycin (ZITHROMAX Z-PAK) 250 MG tablet Take 2 tabs today; then begin one tab once daily for 4 more days. (Rx void after 08/14/17) (Patient not taking: Reported on 01/12/2018) 6 tablet 0  . benzonatate (TESSALON) 200 MG capsule Take one cap by mouth each morning as needed for cough. (Patient not taking: Reported on 01/12/2018) 12 capsule 0  . cyclobenzaprine (FLEXERIL) 10 MG tablet Take 1 tablet (10 mg total) by mouth 3 (three) times daily as needed for muscle spasms. (Patient not taking:  Reported on 01/12/2018) 30 tablet 0  . guaiFENesin-codeine 100-10 MG/5ML syrup Take 10mL by mouth at bedtime as needed for cough (Patient not taking: Reported on 01/12/2018) 120 mL 0  . linaclotide (LINZESS) 290 MCG CAPS capsule Take 1 capsule (290 mcg total) by mouth daily before breakfast. (Patient not taking: Reported on 01/12/2018) 90 capsule 0  . meloxicam (MOBIC) 15 MG tablet Take 1 tablet (15 mg total) by mouth daily. (Patient not taking: Reported on 01/12/2018) 30 tablet 1  . potassium & sodium phosphates (PHOS-NAK) 280-160-250 MG PACK Take 1 packet by mouth 4 (four) times daily -  with meals and at bedtime.    . ranitidine (ZANTAC) 150 MG capsule Take 150 mg by mouth 2 (two) times daily.     No current facility-administered medications for this visit.     Allergies  Allergen Reactions  . Penicillins Other (See Comments) and Rash    Chest tightness      Review of Systems:  Constitutional:  No  fever, no chills, +recent illness, No unintentional weight changes. No significant fatigue.   HEENT: No  headache, no vision change, +ear pain as per HPI, no hearing change, No sore throat, +sinus pressure  Cardiac: No  chest pain, No  pressure, No palpitations, No  Orthopnea  Respiratory:  No  shortness of breath. +Cough  Gastrointestinal: No  abdominal pain, No  nausea  Musculoskeletal: No new myalgia/arthralgia  Skin: No  Rash  Neurologic: No  weakness, No  Dizziness at thist ime  Psychiatric: No  concerns with depression, No  concerns with anxiety, No sleep problems, No mood problems  Exam:  BP 116/71   Pulse 66   Temp 97.9 F (36.6 C) (Oral)   Wt 186 lb (84.4 kg)   BMI 30.02 kg/m   Constitutional: VS see above. General Appearance: alert, well-developed, well-nourished, NAD  Eyes: Normal lids and conjunctive, non-icteric sclera  Ears, Nose, Mouth, Throat: MMM, Normal external inspection ears/nares/mouth/lips/gums. TM normal bilaterally. Pharynx/tonsils no erythema, no  exudate. Nasal mucosa normal.   Neck: No masses, trachea midline. No thyroid enlargement. No tenderness/mass appreciated. No lymphadenopathy  Respiratory: Normal respiratory effort. no wheeze, no rhonchi, no rales  Cardiovascular: S1/S2 normal, no murmur, no rub/gallop auscultated. RRR. No lower extremity edema. Pedal pulse II/IV bilaterally DP and PT. No carotid bruit or JVD. No abdominal aortic bruit.  Gastrointestinal: Nontender, no masses. No hepatomegaly, no splenomegaly. No hernia appreciated. Bowel sounds normal. Rectal exam deferred.   Musculoskeletal: Gait normal. No clubbing/cyanosis of digits.   Neurological: Normal balance/coordination. No tremor. No cranial nerve deficit on limited exam. Motor and sensation intact and symmetric. Cerebellar reflexes intact.   Skin: warm, dry, intact. No rash/ulcer. No concerning nevi or subq nodules on limited exam.    Psychiatric: Normal judgment/insight. Normal mood and affect. Oriented x3.      ASSESSMENT/PLAN:   Acute non-recurrent sinusitis of other sinus - sinusitis w/ eustachian tube dysfunction, meds as below. Confirmed PCN reaction rash on chest only, no widespread hives or anaphylaxis   Eustachian tube dysfunction, right   Meds ordered this encounter  Medications  . cefdinir (OMNICEF) 300 MG capsule    Sig: Take 1 capsule (300 mg total) by mouth 2 (two) times daily. For one week    Dispense:  14 capsule    Refill:  0  . ipratropium (ATROVENT) 0.06 % nasal spray    Sig: Place 2 sprays into both nostrils 4 (four) times daily.    Dispense:  15 mL    Refill:  1  . ibuprofen (ADVIL,MOTRIN) 800 MG tablet    Sig: Take 1 tablet (800 mg total) by mouth every 6 (six) hours as needed for moderate pain.    Dispense:  30 tablet    Refill:  1     Patient Instructions  Over-the-Counter Medications & Home Remedies for Upper Respiratory Illness  Note: the following list assumes no pregnancy, normal liver & kidney function and no  other drug interactions. Dr. Lyn Hollingshead has highlighted medications which are safe for you to use, but these may not be appropriate for everyone. Always ask a pharmacist or qualified medical provider if you have any questions!   Aches/Pains, Fever, Headache Acetaminophen (Tylenol) 500 mg tablets - take max 2 tablets (1000 mg) every 6 hours (4 times per day)  Ibuprofen (Motrin) 200 mg tablets - take max 4 tablets (800 mg) every 6 hours*  Sinus Congestion Prescription Atrovent as directed Nasal Saline if desired Phenylephrine (Sudafed) 10 mg tablets every 4 hours (or the 12-hour formulation)* Diphenhydramine (Benadryl) 25 mg tablets - take max 2 tablets every 4 hours  Cough & Sore Throat Dextromethorphan (Robitussin, others) - cough suppressant Guaifenesin (Robitussin, Mucinex, others) - expectorant (helps cough up mucus) (Dextromethorphan and Guaifenesin also come in a combination tablet) Lozenges w/ Benzocaine + Menthol (Cepacol) Honey - as much as you want! Teas which "coat the throat" - look for ingredients [facility]  Bark, Licorice Root, Marshmallow Root  Other Antibiotics if these are prescribed - take ALL, even if you're feeling better  Zinc Lozenges within 24 hours of symptoms onset - mixed evidence this shortens the duration of the common cold Don't waste your money on Vitamin C or Echinacea  *Caution in patients with high blood pressure       Visit summary with medication list and pertinent instructions was printed for patient to review. All questions at time of visit were answered - patient instructed to contact office with any additional concerns. ER/RTC precautions were reviewed with the patient.   Follow-up plan: Return if symptoms worsen or fail to improve.  Note: Total time spent 25 minutes, greater than 50% of the visit was spent face-to-face counseling and coordinating care for the following: The primary encounter diagnosis was Acute non-recurrent sinusitis of other sinus.  A diagnosis of Eustachian tube dysfunction, right was also pertinent to this visit.[name].  Please note: voice recognition software was used to produce this document, and typos may escape review. Please contact Dr. Lyn HollingsheadAlexander for any needed clarifications.

## 2018-01-15 ENCOUNTER — Encounter: Payer: Self-pay | Admitting: Physician Assistant

## 2018-01-15 ENCOUNTER — Ambulatory Visit (INDEPENDENT_AMBULATORY_CARE_PROVIDER_SITE_OTHER): Payer: Managed Care, Other (non HMO) | Admitting: Physician Assistant

## 2018-01-15 ENCOUNTER — Ambulatory Visit (INDEPENDENT_AMBULATORY_CARE_PROVIDER_SITE_OTHER): Payer: Managed Care, Other (non HMO)

## 2018-01-15 VITALS — BP 116/40 | HR 55 | Ht 66.0 in | Wt 185.0 lb

## 2018-01-15 DIAGNOSIS — Z1231 Encounter for screening mammogram for malignant neoplasm of breast: Secondary | ICD-10-CM | POA: Diagnosis not present

## 2018-01-15 DIAGNOSIS — Z0000 Encounter for general adult medical examination without abnormal findings: Secondary | ICD-10-CM

## 2018-01-15 DIAGNOSIS — K219 Gastro-esophageal reflux disease without esophagitis: Secondary | ICD-10-CM | POA: Diagnosis not present

## 2018-01-15 DIAGNOSIS — Z13220 Encounter for screening for lipoid disorders: Secondary | ICD-10-CM | POA: Diagnosis not present

## 2018-01-15 DIAGNOSIS — Z131 Encounter for screening for diabetes mellitus: Secondary | ICD-10-CM

## 2018-01-15 DIAGNOSIS — R5383 Other fatigue: Secondary | ICD-10-CM | POA: Diagnosis not present

## 2018-01-15 DIAGNOSIS — R252 Cramp and spasm: Secondary | ICD-10-CM | POA: Diagnosis not present

## 2018-01-15 MED ORDER — RANITIDINE HCL 150 MG PO CAPS: 150.0000 mg | ORAL_CAPSULE | Freq: Two times a day (BID) | ORAL | 11 refills | Status: DC

## 2018-01-15 NOTE — Patient Instructions (Signed)

## 2018-01-15 NOTE — Progress Notes (Signed)
Subjective:     Anna Robinson is a 47 y.o. female and is here for a comprehensive physical exam. The patient reports problems - her vision has changed. she is getting glasses this week. she would like something for acid reflux. she is exercising regularly but continues to struggle with low energy. she would like to make sure nothing is low in blood. she is sleeping great.     Social History   Socioeconomic History  . Marital status: Single    Spouse name: Not on file  . Number of children: Not on file  . Years of education: Not on file  . Highest education level: Not on file  Social Needs  . Financial resource strain: Not on file  . Food insecurity - worry: Not on file  . Food insecurity - inability: Not on file  . Transportation needs - medical: Not on file  . Transportation needs - non-medical: Not on file  Occupational History  . Not on file  Tobacco Use  . Smoking status: Never Smoker  . Smokeless tobacco: Never Used  Substance and Sexual Activity  . Alcohol use: Not on file  . Drug use: No  . Sexual activity: Yes    Partners: Female  Other Topics Concern  . Not on file  Social History Narrative  . Not on file   Health Maintenance  Topic Date Due  . HIV Screening  11/06/1984  . PAP SMEAR  03/01/2020 (Originally 11/06/1990)  . MAMMOGRAM  01/15/2019  . TETANUS/TDAP  02/27/2025  . INFLUENZA VACCINE  Completed    The following portions of the patient's history were reviewed and updated as appropriate: allergies, current medications, past family history, past medical history, past social history, past surgical history and problem list.  Review of Systems Pertinent items noted in HPI and remainder of comprehensive ROS otherwise negative.   Objective:    BP (!) 116/40   Pulse (!) 55   Ht 5\' 6"  (1.676 m)   Wt 185 lb (83.9 kg)   SpO2 99%   BMI 29.86 kg/m  General appearance: alert, cooperative and appears stated age Head: Normocephalic, without obvious abnormality,  atraumatic Eyes: conjunctivae/corneas clear. PERRL, EOM's intact. Fundi benign. Ears: normal TM's and external ear canals both ears Nose: Nares normal. Septum midline. Mucosa normal. No drainage or sinus tenderness. Throat: lips, mucosa, and tongue normal; teeth and gums normal Neck: no adenopathy, no carotid bruit, no JVD, supple, symmetrical, trachea midline and thyroid not enlarged, symmetric, no tenderness/mass/nodules Back: symmetric, no curvature. ROM normal. No CVA tenderness. Lungs: clear to auscultation bilaterally Heart: regular rate and rhythm, S1, S2 normal, no murmur, click, rub or gallop Abdomen: soft, non-tender; bowel sounds normal; no masses,  no organomegaly Extremities: extremities normal, atraumatic, no cyanosis or edema Pulses: 2+ and symmetric Skin: Skin color, texture, turgor normal. No rashes or lesions Lymph nodes: Cervical, supraclavicular, and axillary nodes normal. Neurologic: Alert and oriented X 3, normal strength and tone. Normal symmetric reflexes. Normal coordination and gait    Assessment:    Healthy female exam.      Plan:    [name].[name].[name] was seen today for annual exam.  Diagnoses and all orders for this visit:  Routine physical examination -     Lipid Panel w/reflex Direct LDL -     COMPLETE METABOLIC PANEL WITH GFR -     TSH -     B12 -     Vitamin D 1,25 dihydroxy  Muscle cramps  Visit for  screening mammogram -     MM SCREENING BREAST TOMO BILATERAL  No energy -     TSH -     B12 -     Vitamin D 1,25 dihydroxy  Screening for lipid disorders -     Lipid Panel w/reflex Direct LDL  Screening for diabetes mellitus -     COMPLETE METABOLIC PANEL WITH GFR  Gastroesophageal reflux disease without esophagitis -     ranitidine (ZANTAC) 150 MG capsule; Take 1 capsule (150 mg total) by mouth 2 (two) times daily.   .. Depression screen [facility] 2/9 01/15/2018  Decreased Interest 0  Down, Depressed, Hopeless 0  PHQ - 2 Score 0   .. Discussed  150 minutes of exercise a week.  Encouraged vitamin D 1000 units and Calcium 1300mg  or 4 servings of dairy a day.  PAP up to date.  Pt scheduled for mammogram.   Discussed PPI vs H2 blocker for acid reflux. Will try H2 blocker first.   See After Visit Summary for Counseling Recommendations

## 2018-01-19 ENCOUNTER — Telehealth: Payer: Self-pay

## 2018-01-19 ENCOUNTER — Other Ambulatory Visit: Payer: Self-pay | Admitting: Physician Assistant

## 2018-01-19 LAB — COMPLETE METABOLIC PANEL WITH GFR
AG Ratio: 2.1 (calc) (ref 1.0–2.5)
ALKALINE PHOSPHATASE (APISO): 76 U/L (ref 33–115)
ALT: 27 U/L (ref 6–29)
AST: 19 U/L (ref 10–35)
Albumin: 4.6 g/dL (ref 3.6–5.1)
BILIRUBIN TOTAL: 1 mg/dL (ref 0.2–1.2)
BUN: 9 mg/dL (ref 7–25)
CO2: 27 mmol/L (ref 20–32)
Calcium: 9.3 mg/dL (ref 8.6–10.2)
Chloride: 107 mmol/L (ref 98–110)
Creat: 0.94 mg/dL (ref 0.50–1.10)
GFR, Est African American: 83 mL/min/{1.73_m2} (ref >=60)
GFR, Est Non African American: 72 mL/min/{1.73_m2} (ref >=60)
Globulin: 2.2 g/dL (calc) (ref 1.9–3.7)
Glucose, Bld: 96 mg/dL (ref 65–99)
Potassium: 4.1 mmol/L (ref 3.5–5.3)
Sodium: 140 mmol/L (ref 135–146)
Total Protein: 6.8 g/dL (ref 6.1–8.1)

## 2018-01-19 LAB — VITAMIN D 1,25 DIHYDROXY
VITAMIN D 1, 25 (OH) TOTAL: 41 pg/mL (ref 18–72)
Vitamin D2 1, 25 (OH)2: <8 pg/mL
Vitamin D3 1, 25 (OH)2: 41 pg/mL

## 2018-01-19 LAB — LIPID PANEL W/REFLEX DIRECT LDL
Cholesterol: 166 mg/dL (ref <200)
HDL: 33 mg/dL — ABNORMAL LOW (ref >50)
LDL CHOLESTEROL (CALC): 113 mg/dL — AB
NON-HDL CHOLESTEROL (CALC): 133 mg/dL — AB (ref <130)
TRIGLYCERIDES: 101 mg/dL (ref <150)
Total CHOL/HDL Ratio: 5 (calc) — ABNORMAL HIGH (ref <5.0)

## 2018-01-19 LAB — TSH: TSH: 1.99 m[IU]/L

## 2018-01-19 LAB — VITAMIN B12: Vitamin B-12: 293 pg/mL (ref 200–1100)

## 2018-01-19 MED ORDER — FLUCONAZOLE 150 MG PO TABS: 150.0000 mg | ORAL_TABLET | Freq: Once | ORAL | 0 refills | Status: AC

## 2018-01-19 NOTE — Progress Notes (Signed)
Pt had recent abx and reports yeast infection symptoms.

## 2018-01-19 NOTE — Telephone Encounter (Signed)
Patient advised.

## 2018-01-19 NOTE — Telephone Encounter (Signed)
Anna Robinson has vaginal itching and would like diflucan. Please advise. No fever, chills, sweats or pelvic pain.

## 2018-01-19 NOTE — Telephone Encounter (Signed)
Ok I sent to pharmacy.  

## 2018-02-15 ENCOUNTER — Ambulatory Visit: Payer: Managed Care, Other (non HMO) | Admitting: Physician Assistant

## 2018-02-15 ENCOUNTER — Encounter: Payer: Self-pay | Admitting: Physician Assistant

## 2018-02-15 VITALS — BP 106/62 | HR 63 | Temp 97.8°F | Ht 66.0 in | Wt 184.0 lb

## 2018-02-15 DIAGNOSIS — R1013 Epigastric pain: Secondary | ICD-10-CM | POA: Insufficient documentation

## 2018-02-15 DIAGNOSIS — R197 Diarrhea, unspecified: Secondary | ICD-10-CM

## 2018-02-15 DIAGNOSIS — E860 Dehydration: Secondary | ICD-10-CM | POA: Insufficient documentation

## 2018-02-15 LAB — COMPLETE METABOLIC PANEL WITH GFR
AG RATIO: 1.8 (calc) (ref 1.0–2.5)
ALT: 35 U/L — ABNORMAL HIGH (ref 6–29)
AST: 23 U/L (ref 10–35)
Albumin: 4.2 g/dL (ref 3.6–5.1)
Alkaline phosphatase (APISO): 72 U/L (ref 33–115)
BUN: 9 mg/dL (ref 7–25)
CALCIUM: 8.4 mg/dL — AB (ref 8.6–10.2)
CO2: 26 mmol/L (ref 20–32)
Chloride: 107 mmol/L (ref 98–110)
Creat: 0.82 mg/dL (ref 0.50–1.10)
GFR, EST AFRICAN AMERICAN: 98 mL/min/{1.73_m2} (ref >=60)
GFR, EST NON AFRICAN AMERICAN: 85 mL/min/{1.73_m2} (ref >=60)
GLOBULIN: 2.3 g/dL (ref 1.9–3.7)
Glucose, Bld: 87 mg/dL (ref 65–99)
POTASSIUM: 3.7 mmol/L (ref 3.5–5.3)
SODIUM: 139 mmol/L (ref 135–146)
TOTAL PROTEIN: 6.5 g/dL (ref 6.1–8.1)
Total Bilirubin: 1.3 mg/dL — ABNORMAL HIGH (ref 0.2–1.2)

## 2018-02-15 LAB — CBC WITH DIFFERENTIAL/PLATELET
BASOS PCT: 0.2 %
Basophils Absolute: 11 cells/uL (ref 0–200)
Eosinophils Absolute: 90 cells/uL (ref 15–500)
Eosinophils Relative: 1.6 %
HEMATOCRIT: 39.4 % (ref 35.0–45.0)
Hemoglobin: 13.6 g/dL (ref 11.7–15.5)
LYMPHS ABS: 2335 {cells}/uL (ref 850–3900)
MCH: 29.8 pg (ref 27.0–33.0)
MCHC: 34.5 g/dL (ref 32.0–36.0)
MCV: 86.4 fL (ref 80.0–100.0)
MPV: 9.4 fL (ref 7.5–12.5)
Monocytes Relative: 9 %
Neutro Abs: 2660 cells/uL (ref 1500–7800)
Neutrophils Relative %: 47.5 %
Platelets: 186 10*3/uL (ref 140–400)
RBC: 4.56 10*6/uL (ref 3.80–5.10)
RDW: 14.1 % (ref 11.0–15.0)
Total Lymphocyte: 41.7 %
WBC: 5.6 10*3/uL (ref 3.8–10.8)
WBCMIX: 504 {cells}/uL (ref 200–950)

## 2018-02-15 MED ORDER — OMEPRAZOLE 40 MG PO CPDR: 40.0000 mg | DELAYED_RELEASE_CAPSULE | Freq: Every day | ORAL | 3 refills | Status: DC

## 2018-02-15 MED ORDER — DIPHENOXYLATE-ATROPINE 2.5-0.025 MG PO TABS: ORAL_TABLET | ORAL | 0 refills | Status: DC

## 2018-02-15 NOTE — Progress Notes (Signed)
Glucose normal. Calcium a little low. Kidney function normal.

## 2018-02-15 NOTE — Patient Instructions (Signed)

## 2018-02-15 NOTE — Progress Notes (Addendum)
Subjective:     Patient ID: Anna Robinson, female   DOB: February 18, 1971, 47 y.o.   MRN: [id number]  HPI   Ms. Notz is a 47 year old female with hx of GERD presented to the office with chief complain of abdominal cramping and runny diarrhea. Her symptoms stated after she ate some burritos and nacho. She felt sick and nauseated after her dinner. On Friday, she walk up with severe Left upper abdominal cramping. At work, she started having constant watery diarrhea and had to leave from work. She is having a bowel movement 1-2 times an hour. She tried to place heating pad, and took lost of ibuprofen to relieve cramping without any relief. Her symptoms improved some and then She tried to eat some food over the weekend including cheese and chicken. Eating any type of food makes her cramping and diarrhea worse. Patient states feeling dizzy, exhausted and dry mouth. Patient felt nauseated and burping a lot with acidic/metalic taste but denies vomiting. Patient describe her stool watery brown and denies noticing any blood in stool. Patient was seen in the beginning of last month with sinus infection and treated with antibiotic. Patient states having night sweats, mild headaches but denies fever or chills. Denies SOB, cough, hemoptysis. Patient mentioned having history of acid reflux and when it flares up, she gets constipated but patient never experienced these symptoms before. .. Active Ambulatory Problems    Diagnosis Date Noted  . Muscle cramps 03/02/2015  . No energy 01/15/2018  . Epigastric pain 02/15/2018  . Diarrhea 02/15/2018  . Dehydration, mild 02/15/2018   Resolved Ambulatory Problems    Diagnosis Date Noted  . Right lower quadrant abdominal tenderness 03/02/2015   Past Medical History:  Diagnosis Date  . Anxiety   . Seasonal allergies    Review of Systems  As mentioned in HPI    Objective:   Physical Exam  Constitutional: She is oriented to person, place, and time. She appears  well-developed and well-nourished. No distress.  HENT:  Head: Normocephalic and atraumatic.  Nose: Nose normal.  Eyes: Conjunctivae are normal. Pupils are equal, round, and reactive to light. Right eye exhibits no discharge. Left eye exhibits no discharge.  Neck: Neck supple. No tracheal deviation present. No thyromegaly present.  Cardiovascular: Normal rate, regular rhythm, normal heart sounds and intact distal pulses. Exam reveals no gallop and no friction rub.  No murmur heard. Pulmonary/Chest: Effort normal and breath sounds normal. No respiratory distress. She has no wheezes. She has no rales.  Abdominal: Soft. Bowel sounds are normal.  Diffused tenderness to deep palpation at each quadrant. Tenderness at McBurney's point. Negative Psoas sign.  Neurological: She is alert and oriented to person, place, and time.  Skin: No rash noted.  Psychiatric: She has a normal mood and affect. Her behavior is normal. Judgment and thought content normal.     Assessment:     Marland KitchenMarland KitchenApril was seen today for abdominal pain and diarrhea.  Diagnoses and all orders for this visit:  Diarrhea, unspecified type -     Stool culture -     CBC with Differential/Platelet -     Clostridium difficile culture-fecal -     COMPLETE METABOLIC PANEL WITH GFR -     diphenoxylate-atropine (LOMOTIL) 2.5-0.025 MG tablet; One to 2 tablets by mouth 4 times a day as needed for diarrhea.  Epigastric pain -     omeprazole (PRILOSEC) 40 MG capsule; Take 1 capsule (40 mg total) by mouth daily. -  COMPLETE METABOLIC PANEL WITH GFR  Dehydration, mild -     COMPLETE METABOLIC PANEL WITH GFR      Plan:     Patient's symptoms started after eating at [employer] on Thursday evening. Most likely etiology of her symptoms is food related. However, diffused abdominal tenderness noted during deep palpation. Tenderness noted at McBurney's point. Negative Psoas sign. Diverticulitis or acute appendicitis are less likely at this time  and CT of the abdomen is not indicated. Will further evaluate if elevated WBC on CBC. She does have hx of recent abx will test for c.diff.   Patient reports constantly using bathroom. Patient feels light headed and weak. Restore optimal fluid balance is vital at this time. An IV will be placed and and 0.9% NaCl will be administered. See note from Gena Fray, [name]. IV access and 2L of fluids started at 9:25am and IV assess was stopped at 10:32.   Patient was given GI cocktail for immediate symptom relief. Patient is to go to lab for stool sample collection for culture.   Will add Omeprazole for symptom relief or her GI reflux as patient states not relief in her symptoms with ranitidine.   Recommend patient BRAT diet until relief in her symptoms and slowly reintroduce nutrient rich food. Lomotil given to start pending no concern in labs.   Encourage patient to use probiotics.   Follow up as needed or in a few days if diarrhea does not improve.   [name].Spent 40 minutes with patient and greater than 50 percent of visit spent counseling patient regarding treatment plan.

## 2018-02-15 NOTE — Progress Notes (Signed)
WBC is normal. Ok to start lomotil for diarrhea. It has already been sent.

## 2018-02-15 NOTE — Progress Notes (Signed)
Consulted by Tandy GawJade Breeback [name] for IV access.  Inserted #20 gauge, 1-inch BD Insyte angiocatheter in the left cephalic vein on 1 attempt. Good blood return. Flushed with normal saline. IV fluid resuscitation per the primary care team.  Gena Frayharley Cummings [name]

## 2018-05-04 ENCOUNTER — Encounter: Payer: Self-pay | Admitting: Physician Assistant

## 2018-05-04 ENCOUNTER — Ambulatory Visit: Payer: Managed Care, Other (non HMO) | Admitting: Physician Assistant

## 2018-05-04 VITALS — BP 116/75 | HR 63 | Ht 66.0 in | Wt 179.0 lb

## 2018-05-04 DIAGNOSIS — L853 Xerosis cutis: Secondary | ICD-10-CM | POA: Diagnosis not present

## 2018-05-04 DIAGNOSIS — J0140 Acute pansinusitis, unspecified: Secondary | ICD-10-CM | POA: Diagnosis not present

## 2018-05-04 MED ORDER — KETOROLAC TROMETHAMINE 60 MG/2ML IM SOLN
60.0000 mg | Freq: Once | INTRAMUSCULAR | Status: AC
  Administered 2018-05-04: 60 mg via INTRAMUSCULAR

## 2018-05-04 MED ORDER — AZITHROMYCIN 250 MG PO TABS: ORAL_TABLET | ORAL | 0 refills | Status: DC

## 2018-05-04 MED ORDER — PREDNISONE 50 MG PO TABS: ORAL_TABLET | ORAL | 0 refills | Status: DC

## 2018-05-04 NOTE — Patient Instructions (Signed)

## 2018-05-04 NOTE — Progress Notes (Signed)
Subjective:    Patient ID: Anna Robinson, female    DOB: 10/25/1971, 47 y.o.   MRN: [id number]  HPI  47 y/o female patient presents for evaluation of sinus congestion and allergy symptoms that have been going on for 2 weeks now. She has been experiencing nasal congestion, ear pressure, itchy, watery eyes, cough, headache, and post-nasal drip. The she will occasionally cough up mucus. She has been taking Zyrtec-D twice a day, Benadryl three times per day, Afrin 2-3 times per day, and Rohto eye drops. This makes her feel somewhat better, however she says that the symptoms still persist. She has not seen an allergist or have allergy testing before. She denies fever.   She also complains about several small rough spots on her arms bilaterally that have been present for several weeks. She says that she has had one before that resolved after she put lotion on it for awhile. They have not gotten worse or larger in size.  .. Active Ambulatory Problems    Diagnosis Date Noted  . Muscle cramps 03/02/2015  . No energy 01/15/2018  . Epigastric pain 02/15/2018  . Diarrhea 02/15/2018  . Dehydration, mild 02/15/2018   Resolved Ambulatory Problems    Diagnosis Date Noted  . Right lower quadrant abdominal tenderness 03/02/2015   Past Medical History:  Diagnosis Date  . Anxiety   . Seasonal allergies      Review of Systems  All other systems reviewed and are negative.      Objective:   Physical Exam  Constitutional: She is oriented to person, place, and time. She appears well-developed and well-nourished.  HENT:  Head: Normocephalic and atraumatic.  Right Ear: External ear normal.  Left Ear: External ear normal.  Mouth/Throat: Oropharynx is clear and moist.  Eyes: Pupils are equal, round, and reactive to light. Conjunctivae and EOM are normal.  Cardiovascular: Normal rate, regular rhythm and normal heart sounds.  Pulmonary/Chest: Effort normal and breath sounds normal.  Neurological:  She is alert and oriented to person, place, and time.  Skin: Skin is warm and dry.  Slightly raised rough papules on bilateral upper arms.   Psychiatric: She has a normal mood and affect. Her behavior is normal. Thought content normal.  Vitals reviewed.     Assessment & Plan:  Marland KitchenMarland KitchenDiagnoses and all orders for this visit:  Acute non-recurrent pansinusitis -     predniSONE (DELTASONE) 50 MG tablet; Take one tablet daily for 5 days. -     azithromycin (ZITHROMAX) 250 MG tablet; Take 2 tablets now and then one tablet for 4 days. -     ketorolac (TORADOL) injection 60 mg  Dry skin dermatitis  Advised patient to continue taking an antihistamine but stop while on prednisone and abx and ibuprofen as needed for headache. Toradol given for headache relief today. Pt aware not to take any more NSAIDs today. Certainly could consider allergy testing in future if allergies worsening.   Appears more like dry skin dermitis. Could be the early start of SK or AK. Will continue to monitor. For now keep moisturized.

## 2018-08-10 ENCOUNTER — Ambulatory Visit (INDEPENDENT_AMBULATORY_CARE_PROVIDER_SITE_OTHER): Payer: Managed Care, Other (non HMO) | Admitting: Osteopathic Medicine

## 2018-08-10 ENCOUNTER — Encounter: Payer: Self-pay | Admitting: Osteopathic Medicine

## 2018-08-10 VITALS — BP 115/65 | HR 64 | Temp 97.7°F | Wt 179.5 lb

## 2018-08-10 DIAGNOSIS — J0100 Acute maxillary sinusitis, unspecified: Secondary | ICD-10-CM | POA: Diagnosis not present

## 2018-08-10 DIAGNOSIS — R232 Flushing: Secondary | ICD-10-CM

## 2018-08-10 DIAGNOSIS — H00012 Hordeolum externum right lower eyelid: Secondary | ICD-10-CM

## 2018-08-10 MED ORDER — FLUCONAZOLE 150 MG PO TABS: 150.0000 mg | ORAL_TABLET | Freq: Once | ORAL | 1 refills | Status: AC

## 2018-08-10 MED ORDER — AZELASTINE HCL 0.05 % OP SOLN: 1.0000 [drp] | Freq: Two times a day (BID) | OPHTHALMIC | 1 refills | Status: DC

## 2018-08-10 MED ORDER — IPRATROPIUM BROMIDE 0.06 % NA SOLN: 2.0000 | Freq: Four times a day (QID) | NASAL | 1 refills | Status: DC | PRN

## 2018-08-10 MED ORDER — CEFDINIR 300 MG PO CAPS: 300.0000 mg | ORAL_CAPSULE | Freq: Two times a day (BID) | ORAL | 0 refills | Status: DC

## 2018-08-10 NOTE — Patient Instructions (Addendum)
Plan:  Let's treat for sinus infection and allergy issues (antibiotics, nasal spray)   Will add eye drops for allergic eye symptoms   Will plan to follow up with eye doctor if no better (call us, or can make [place] with Lesly RubensteinJade)   Continue warm compresses  DO NOT wear eye makeup on lids/lashes until the eye issue resolves

## 2018-08-10 NOTE — Progress Notes (Signed)
HPI: Anna Robinson is a 47 y.o. female who  has a past medical history of Anxiety and Seasonal allergies.  [facility], 08/10/18,  for chief complaint of:  Illness  Few weeks, R eye infection, stye, tobramycin drops to hep with this. This hasn't completely resolved. (Urgent care notes reviewed form 07/08/18 about a month ago: stye x1 day, advised tobramycin, warm compresses).   Reports persistent redness but no pain in inner lower R eyelid. She's been doing the warm compresses but is wearing eye liner and mascara on lower lids.   She is worried about head cold, headaches, hot flashes.   Headaches feel like sinus pressure, alternating between allegra-D, claritin-D, zyrtec-D. Reports headaches have been going on since the eye infection.   Eyes are itching and occasionally crusty in AM.       Past medical history, surgical history, and family history reviewed.  Current medication list and allergy/intolerance information reviewed.   (See remainder of HPI, ROS, Phys Exam below)     ASSESSMENT/PLAN:   Subacute maxillary sinusitis - trial abx and nasal spray see if this treates headache   Hordeolum externum of right lower eyelid - healing but not resolved, see pt instructions   Hot flashes - likely unrelated perimenopause, no fevers    Meds ordered this encounter  Medications  . azelastine (OPTIVAR) 0.05 % ophthalmic solution    Sig: Place 1 drop into both eyes 2 (two) times daily.    Dispense:  6 mL    Refill:  1  . cefdinir (OMNICEF) 300 MG capsule    Sig: Take 1 capsule (300 mg total) by mouth 2 (two) times daily. For sinus infection    Dispense:  10 capsule    Refill:  0  . fluconazole (DIFLUCAN) 150 MG tablet    Sig: Take 1 tablet (150 mg total) by mouth once for 1 dose. As needed for yeast infection. Repeat dose in 48 hours if symptoms persist    Dispense:  2 tablet    Refill:  1  . ipratropium (ATROVENT) 0.06 %  nasal spray    Sig: Place 2 sprays into both nostrils 4 (four) times daily as needed for rhinitis.    Dispense:  15 mL    Refill:  1    Patient Instructions  Plan:  Let's treat for sinus infection and allergy issues (antibiotics, nasal spray)   Will add eye drops for allergic eye symptoms   Will plan to follow up with eye doctor if no better (call us, or can make appointment with Lesly RubensteinJade)   Continue warm compresses  DO NOT wear eye makeup on lids/lashes until the eye issue resolves    Follow-up plan: Return in about 2 weeks (around 08/24/2018) for recheck with Jade if symptoms aren't better, see us sooner if needed .                            ############################################ ############################################ ############################################ ############################################    Outpatient Encounter Medications as of 08/10/2018  Medication Sig  . diphenhydrAMINE (BENADRYL) 25 mg capsule Take 25 mg by mouth every 6 (six) hours as needed.  [name]. ibuprofen (ADVIL,MOTRIN) 800 MG tablet Take 1 tablet (800 mg total) by mouth every 6 (six) hours as needed for moderate pain.  . ranitidine (ZANTAC) 150 MG capsule Take 1 capsule (150 mg total) by mouth 2 (two) times daily.  [name]. tobramycin (TOBREX) 0.3 %  ophthalmic solution Place two drops into the right eye every 4 (four) hours for 7 days.  [name] azithromycin (ZITHROMAX) 250 MG tablet Take 2 tablets now and then one tablet for 4 days. (Patient not taking: Reported on 08/10/2018)  . ipratropium (ATROVENT) 0.06 % nasal spray Place 2 sprays into both nostrils 4 (four) times daily. (Patient not taking: Reported on 08/10/2018)  . omeprazole (PRILOSEC) 40 MG capsule Take 1 capsule (40 mg total) by mouth daily. (Patient not taking: Reported on 08/10/2018)  . predniSONE (DELTASONE) 50 MG tablet Take one tablet daily for 5 days. (Patient not taking: Reported on 08/10/2018)   No  facility-administered encounter medications on file as of 08/10/2018.    Allergies  Allergen Reactions  . Penicillins Other (See Comments) and Rash    Chest tightness      Review of Systems:  Constitutional: No recent illness  HEENT: +headache, no vision change, see HPI  Cardiac: No  chest pain, No  pressure, No palpitations  Respiratory:  No  shortness of breath. No  Cough  Musculoskeletal: No new myalgia/arthralgia  Skin: No  Rash  Neurologic: No  weakness, No  Dizziness   Exam:  BP 115/65 (BP Location: Left Arm, Patient Position: Sitting, Cuff Size: Normal)   Pulse 64   Temp 97.7 F (36.5 C) (Oral)   Wt 179 lb 8 oz (81.4 kg)   BMI 28.97 kg/m   Constitutional: VS see above. General Appearance: alert, well-developed, well-nourished, NAD  Eyes: Normal lids and conjunctive except small area of redness R lower lid but no active infection or drainage, non-icteric sclera, EOMI, PERRL.   Ears, Nose, Mouth, Throat: MMM, Normal external inspection ears/nares/mouth/lips/gums. TM WNL bilaterally w/ scant clear effusion behind R one. Normal pharynx.   Neck: No masses, trachea midline. No lymphadenopathy.   Respiratory: Normal respiratory effort. no wheeze, no rhonchi, no rales  Cardiovascular: S1/S2 normal, no murmur, no rub/gallop auscultated. RRR.   Musculoskeletal: Gait normal. Symmetric and independent movement of all extremities  Neurological: Normal balance/coordination. No tremor.  Skin: warm, dry, intact.   Psychiatric: Normal judgment/insight. Normal mood and affect. Oriented x3.   Visit summary with medication list and pertinent instructions was printed for patient to review, advised to alert [place] if any changes needed. All questions at time of visit were answered - patient instructed to contact office with any additional concerns. ER/RTC precautions were reviewed with the patient and understanding verbalized.   Follow-up plan: Return in about 2 weeks (around  08/24/2018) for recheck with Jade if symptoms aren't better, see [place] sooner if needed .    Please note: voice recognition software was used to produce this document, and typos may escape review. Please contact Dr. Lyn Hollingshead for any needed clarifications.

## 2019-01-24 ENCOUNTER — Ambulatory Visit: Payer: Managed Care, Other (non HMO) | Admitting: Physician Assistant

## 2019-01-24 ENCOUNTER — Encounter: Payer: Self-pay | Admitting: Physician Assistant

## 2019-01-24 VITALS — BP 116/65 | HR 64 | Temp 98.4°F | Ht 66.0 in | Wt 188.0 lb

## 2019-01-24 DIAGNOSIS — J302 Other seasonal allergic rhinitis: Secondary | ICD-10-CM

## 2019-01-24 DIAGNOSIS — Z131 Encounter for screening for diabetes mellitus: Secondary | ICD-10-CM | POA: Diagnosis not present

## 2019-01-24 DIAGNOSIS — H6983 Other specified disorders of Eustachian tube, bilateral: Secondary | ICD-10-CM

## 2019-01-24 DIAGNOSIS — R7301 Impaired fasting glucose: Secondary | ICD-10-CM

## 2019-01-24 DIAGNOSIS — R5383 Other fatigue: Secondary | ICD-10-CM

## 2019-01-24 DIAGNOSIS — E559 Vitamin D deficiency, unspecified: Secondary | ICD-10-CM

## 2019-01-24 DIAGNOSIS — Z13220 Encounter for screening for lipoid disorders: Secondary | ICD-10-CM

## 2019-01-24 DIAGNOSIS — Z0000 Encounter for general adult medical examination without abnormal findings: Secondary | ICD-10-CM

## 2019-01-24 DIAGNOSIS — Z1231 Encounter for screening mammogram for malignant neoplasm of breast: Secondary | ICD-10-CM | POA: Diagnosis not present

## 2019-01-24 DIAGNOSIS — E785 Hyperlipidemia, unspecified: Secondary | ICD-10-CM

## 2019-01-24 MED ORDER — MONTELUKAST SODIUM 10 MG PO TABS: 10.0000 mg | ORAL_TABLET | Freq: Every day | ORAL | 3 refills | Status: DC

## 2019-01-24 MED ORDER — LEVOCETIRIZINE DIHYDROCHLORIDE 5 MG PO TABS: 5.0000 mg | ORAL_TABLET | Freq: Every evening | ORAL | 3 refills | Status: DC

## 2019-01-24 MED ORDER — METHYLPREDNISOLONE 4 MG PO TBPK: ORAL_TABLET | ORAL | 0 refills | Status: DC

## 2019-01-24 MED ORDER — FLUTICASONE PROPIONATE 50 MCG/ACT NA SUSP: 2.0000 | Freq: Every day | NASAL | 6 refills | Status: DC

## 2019-01-24 NOTE — Patient Instructions (Addendum)

## 2019-01-26 ENCOUNTER — Encounter: Payer: Self-pay | Admitting: Physician Assistant

## 2019-01-26 ENCOUNTER — Other Ambulatory Visit: Payer: Self-pay | Admitting: Physician Assistant

## 2019-01-26 DIAGNOSIS — R7301 Impaired fasting glucose: Secondary | ICD-10-CM | POA: Insufficient documentation

## 2019-01-26 DIAGNOSIS — E559 Vitamin D deficiency, unspecified: Secondary | ICD-10-CM | POA: Insufficient documentation

## 2019-01-26 DIAGNOSIS — E785 Hyperlipidemia, unspecified: Secondary | ICD-10-CM | POA: Insufficient documentation

## 2019-01-26 DIAGNOSIS — D72829 Elevated white blood cell count, unspecified: Secondary | ICD-10-CM

## 2019-01-26 DIAGNOSIS — R748 Abnormal levels of other serum enzymes: Secondary | ICD-10-CM

## 2019-01-26 NOTE — Progress Notes (Signed)
Per labs:  "WBC and liver enzymes up some. Could be a virus/illness. Would like to repeat in 2 weeks. "  CBC and CMP ordered for 2 weeks. Faxed order to lab

## 2019-01-26 NOTE — Progress Notes (Signed)
Call pt: confirm she if she was fasting? Your glucose was elevated. If fasting needs A!C.  WBC and liver enzymes up some. Could be a virus/illness. Would like to repeat in 2 weeks.  Thyroid is fine.  b12 is too elevated.  Vitamin D just barely low. What is current dose?  Cholesterol is elevated. Start watching fried/fatty foods. Will continue to monitor.

## 2019-01-28 LAB — COMPREHENSIVE METABOLIC PANEL
AG Ratio: 1.8 (calc) (ref 1.0–2.5)
ALKALINE PHOSPHATASE (APISO): 151 U/L — AB (ref 31–125)
ALT: 85 U/L — ABNORMAL HIGH (ref 6–29)
AST: 33 U/L (ref 10–35)
Albumin: 4.8 g/dL (ref 3.6–5.1)
BILIRUBIN TOTAL: 1 mg/dL (ref 0.2–1.2)
BUN: 15 mg/dL (ref 7–25)
CALCIUM: 9.8 mg/dL (ref 8.6–10.2)
CO2: 24 mmol/L (ref 20–32)
Chloride: 105 mmol/L (ref 98–110)
Creat: 0.83 mg/dL (ref 0.50–1.10)
Globulin: 2.7 g/dL (calc) (ref 1.9–3.7)
Glucose, Bld: 131 mg/dL — ABNORMAL HIGH (ref 65–99)
POTASSIUM: 4.6 mmol/L (ref 3.5–5.3)
Sodium: 138 mmol/L (ref 135–146)
Total Protein: 7.5 g/dL (ref 6.1–8.1)

## 2019-01-28 LAB — CBC
HCT: 44.2 % (ref 35.0–45.0)
Hemoglobin: 15.2 g/dL (ref 11.7–15.5)
MCH: 29.3 pg (ref 27.0–33.0)
MCHC: 34.4 g/dL (ref 32.0–36.0)
MCV: 85.2 fL (ref 80.0–100.0)
MPV: 9.9 fL (ref 7.5–12.5)
PLATELETS: 296 10*3/uL (ref 140–400)
RBC: 5.19 10*6/uL — ABNORMAL HIGH (ref 3.80–5.10)
RDW: 13.1 % (ref 11.0–15.0)
WBC: 13.3 10*3/uL — AB (ref 3.8–10.8)

## 2019-01-28 LAB — LIPID PANEL W/REFLEX DIRECT LDL
CHOLESTEROL: 194 mg/dL (ref <200)
HDL: 39 mg/dL — AB (ref >=50)
LDL CHOLESTEROL (CALC): 139 mg/dL — AB
NON-HDL CHOLESTEROL (CALC): 155 mg/dL — AB (ref <130)
TRIGLYCERIDES: 69 mg/dL (ref <150)
Total CHOL/HDL Ratio: 5 (calc) — ABNORMAL HIGH (ref <5.0)

## 2019-01-28 LAB — SEDIMENTATION RATE: Sed Rate: 9 mm/h (ref 0–20)

## 2019-01-28 LAB — C-REACTIVE PROTEIN: CRP: 1.6 mg/L (ref <8.0)

## 2019-01-28 LAB — HEMOGLOBIN A1C
EAG (MMOL/L): 6.2 (calc)
Hgb A1c MFr Bld: 5.5 % of total Hgb (ref <5.7)
Mean Plasma Glucose: 111 (calc)

## 2019-01-28 LAB — TEST AUTHORIZATION

## 2019-01-28 LAB — VITAMIN B12: Vitamin B-12: >2000 pg/mL — ABNORMAL HIGH (ref 200–1100)

## 2019-01-28 LAB — FERRITIN: Ferritin: 117 ng/mL (ref 16–232)

## 2019-01-28 LAB — VITAMIN D 25 HYDROXY (VIT D DEFICIENCY, FRACTURES): VIT D 25 HYDROXY: 28 ng/mL — AB (ref 30–100)

## 2019-01-28 LAB — TSH: TSH: 0.6 mIU/L

## 2019-02-01 ENCOUNTER — Encounter: Payer: Self-pay | Admitting: Physician Assistant

## 2019-02-01 NOTE — Progress Notes (Signed)
Subjective:     Anna Robinson is a 48 y.o. female and is here for a comprehensive physical exam. The patient reports problems - pt is struggling with fatigue. she just has no energy and wonders if she is anemic.    She is also having some bilateral ear pain and popping. Hx of allergies. Treated with flonase, xyzal, singulair. No fever, chills, sinus pressure. She does feel congested.   Social History   Socioeconomic History  . Marital status: Single    Spouse name: Not on file  . Number of children: Not on file  . Years of education: Not on file  . Highest education level: Not on file  Occupational History  . Not on file  Social Needs  . Financial resource strain: Not on file  . Food insecurity:    Worry: Not on file    Inability: Not on file  . Transportation needs:    Medical: Not on file    Non-medical: Not on file  Tobacco Use  . Smoking status: Never Smoker  . Smokeless tobacco: Never Used  Substance and Sexual Activity  . Alcohol use: Not on file  . Drug use: No  . Sexual activity: Yes    Partners: Female  Lifestyle  . Physical activity:    Days per week: Not on file    Minutes per session: Not on file  . Stress: Not on file  Relationships  . Social connections:    Talks on phone: Not on file    Gets together: Not on file    Attends religious service: Not on file    Active member of club or organization: Not on file    Attends meetings of clubs or organizations: Not on file    Relationship status: Not on file  . Intimate partner violence:    Fear of current or ex partner: Not on file    Emotionally abused: Not on file    Physically abused: Not on file    Forced sexual activity: Not on file  Other Topics Concern  . Not on file  Social History Narrative  . Not on file   Health Maintenance  Topic Date Due  . MAMMOGRAM  01/15/2019  . HIV Screening  01/25/2020 (Originally 11/06/1986)  . TETANUS/TDAP  02/27/2025  . INFLUENZA VACCINE  Completed    The  following portions of the patient's history were reviewed and updated as appropriate: allergies, current medications, past family history, past medical history, past social history, past surgical history and problem list.  Review of Systems Pertinent items noted in HPI and remainder of comprehensive ROS otherwise negative.   Objective:    BP 116/65   Pulse 64   Temp 98.4 F (36.9 C) (Oral)   Ht 5\' 6"  (1.676 m)   Wt 188 lb (85.3 kg)   BMI 30.34 kg/m  General appearance: alert, cooperative and appears stated age Head: Normocephalic, without obvious abnormality, atraumatic Eyes: conjunctivae/corneas clear. PERRL, EOM's intact. Fundi benign. Ears: normal TM's and external ear canals both ears Nose: Nares normal. Septum midline. Mucosa normal. No drainage or sinus tenderness. Throat: lips, mucosa, and tongue normal; teeth and gums normal Neck: no adenopathy, no carotid bruit, no JVD, supple, symmetrical, trachea midline and thyroid not enlarged, symmetric, no tenderness/mass/nodules Back: symmetric, no curvature. ROM normal. No CVA tenderness. Lungs: clear to auscultation bilaterally Heart: regular rate and rhythm, S1, S2 normal, no murmur, click, rub or gallop Abdomen: soft, non-tender; bowel sounds normal; no masses,  no  organomegaly Extremities: extremities normal, atraumatic, no cyanosis or edema Pulses: 2+ and symmetric Skin: Skin color, texture, turgor normal. No rashes or lesions Lymph nodes: Cervical, supraclavicular, and axillary nodes normal. Neurologic: Alert and oriented X 3, normal strength and tone. Normal symmetric reflexes. Normal coordination and gait    Assessment:    Healthy female exam.      Plan:  Marland KitchenMarland KitchenApril was seen today for annual exam.  Diagnoses and all orders for this visit:  Routine physical examination -     MM 3D SCREEN BREAST BILATERAL -     Lipid Panel w/reflex Direct LDL -     CBC -     Comprehensive metabolic panel -     C-reactive protein -      Ferritin -     Sedimentation rate -     TSH -     Vitamin B12 -     Vit D  25 hydroxy (rtn osteoporosis monitoring) -     fluticasone (FLONASE) 50 MCG/ACT nasal spray; Place 2 sprays into both nostrils daily. -     levocetirizine (XYZAL ALLERGY 24HR) 5 MG tablet; Take 1 tablet (5 mg total) by mouth every evening. -     methylPREDNISolone (MEDROL DOSEPAK) 4 MG TBPK tablet; Take as directed by package insert -     montelukast (SINGULAIR) 10 MG tablet; Take 1 tablet (10 mg total) by mouth at bedtime.  Visit for screening mammogram -     MM 3D SCREEN BREAST BILATERAL  Screening for diabetes mellitus -     MM 3D SCREEN BREAST BILATERAL -     Comprehensive metabolic panel  Screening for lipid disorders -     Lipid Panel w/reflex Direct LDL  No energy -     CBC -     Comprehensive metabolic panel -     C-reactive protein -     Ferritin -     Sedimentation rate -     TSH -     Vitamin B12 -     Vit D  25 hydroxy (rtn osteoporosis monitoring)  Dyslipidemia -     Lipid Panel w/reflex Direct LDL  Vitamin D insufficiency  Seasonal allergies -     fluticasone (FLONASE) 50 MCG/ACT nasal spray; Place 2 sprays into both nostrils daily. -     levocetirizine (XYZAL ALLERGY 24HR) 5 MG tablet; Take 1 tablet (5 mg total) by mouth every evening. -     montelukast (SINGULAIR) 10 MG tablet; Take 1 tablet (10 mg total) by mouth at bedtime.  ETD (Eustachian tube dysfunction), bilateral -     methylPREDNISolone (MEDROL DOSEPAK) 4 MG TBPK tablet; Take as directed by package insert  Other orders -     Discontinue: fluticasone (FLONASE) 50 MCG/ACT nasal spray; Place 2 sprays into both nostrils daily. -     Discontinue: montelukast (SINGULAIR) 10 MG tablet; Take 1 tablet (10 mg total) by mouth at bedtime. -     Discontinue: levocetirizine (XYZAL ALLERGY 24HR) 5 MG tablet; Take 1 tablet (5 mg total) by mouth every evening. -     Discontinue: methylPREDNISolone (MEDROL DOSEPAK) 4 MG TBPK tablet; Take  as directed by package insert -     Hemoglobin A1c -     TEST AUTHORIZATION   .[name] Depression screen [facility] 2/9 01/24/2019 08/10/2018 01/15/2018  Decreased Interest 2 0 0  Down, Depressed, Hopeless 1 0 0  PHQ - 2 Score 3 0 0  Altered sleeping 2 3 -  Tired, decreased energy 3 0 -  Change in appetite 1 1 -  Feeling bad or failure about yourself  0 0 -  Trouble concentrating 0 0 -  Moving slowly or fidgety/restless 0 0 -  Suicidal thoughts 0 0 -  PHQ-9 Score 9 4 -  Difficult doing work/chores Somewhat difficult Not difficult at all -   .. Discussed 150 minutes of exercise a week.  Encouraged vitamin D 1000 units and Calcium 1300mg  or 4 servings of dairy a day.  Mammogram ordered.  Pap up to date. Declined STI screening.  Labs ordered for screening and to evaluate for fatigue.   No signs of infection. Medrol dose pak given for allergies and ETD.    See After Visit Summary for Counseling Recommendations

## 2019-02-09 ENCOUNTER — Ambulatory Visit (INDEPENDENT_AMBULATORY_CARE_PROVIDER_SITE_OTHER): Payer: Managed Care, Other (non HMO)

## 2019-02-09 DIAGNOSIS — Z1231 Encounter for screening mammogram for malignant neoplasm of breast: Secondary | ICD-10-CM

## 2019-02-09 LAB — CBC
HCT: 40.5 % (ref 35.0–45.0)
Hemoglobin: 14 g/dL (ref 11.7–15.5)
MCH: 29.7 pg (ref 27.0–33.0)
MCHC: 34.6 g/dL (ref 32.0–36.0)
MCV: 86 fL (ref 80.0–100.0)
MPV: 10.1 fL (ref 7.5–12.5)
Platelets: 195 10*3/uL (ref 140–400)
RBC: 4.71 10*6/uL (ref 3.80–5.10)
RDW: 13.1 % (ref 11.0–15.0)
WBC: 6.3 10*3/uL (ref 3.8–10.8)

## 2019-02-09 LAB — COMPLETE METABOLIC PANEL WITH GFR
AG Ratio: 1.8 (calc) (ref 1.0–2.5)
ALKALINE PHOSPHATASE (APISO): 124 U/L (ref 31–125)
ALT: 140 U/L — ABNORMAL HIGH (ref 6–29)
AST: 49 U/L — AB (ref 10–35)
Albumin: 4.2 g/dL (ref 3.6–5.1)
BUN: 10 mg/dL (ref 7–25)
CO2: 27 mmol/L (ref 20–32)
Calcium: 9.5 mg/dL (ref 8.6–10.2)
Chloride: 105 mmol/L (ref 98–110)
Creat: 0.95 mg/dL (ref 0.50–1.10)
GFR, Est African American: 82 mL/min/{1.73_m2} (ref >=60)
GFR, Est Non African American: 70 mL/min/{1.73_m2} (ref >=60)
Globulin: 2.4 g/dL (calc) (ref 1.9–3.7)
Glucose, Bld: 95 mg/dL (ref 65–99)
Potassium: 4.1 mmol/L (ref 3.5–5.3)
Sodium: 141 mmol/L (ref 135–146)
Total Bilirubin: 1 mg/dL (ref 0.2–1.2)
Total Protein: 6.6 g/dL (ref 6.1–8.1)

## 2019-02-11 NOTE — Progress Notes (Signed)
Call pt: normal mammogram in 1 year.

## 2019-02-14 ENCOUNTER — Encounter: Payer: Self-pay | Admitting: Physician Assistant

## 2019-02-14 DIAGNOSIS — R748 Abnormal levels of other serum enzymes: Secondary | ICD-10-CM | POA: Insufficient documentation

## 2019-02-14 NOTE — Progress Notes (Signed)
Call pt: kidney function looks great. Liver enzymes are elevated. You have had elevated in the past....have you had this completely worked up before liver u/s, labs etc?   Are you drinking more alcohol? Taking more tylenol? Recheck in 2 weeks to make sure not trending up.   CBC looks great.

## 2019-03-16 ENCOUNTER — Telehealth: Payer: Self-pay | Admitting: Physician Assistant

## 2019-03-16 DIAGNOSIS — R748 Abnormal levels of other serum enzymes: Secondary | ICD-10-CM

## 2019-03-16 NOTE — Telephone Encounter (Signed)
Order placed

## 2019-03-17 LAB — HEPATIC FUNCTION PANEL
AG Ratio: 2.4 (calc) (ref 1.0–2.5)
ALT: 47 U/L — ABNORMAL HIGH (ref 6–29)
AST: 27 U/L (ref 10–35)
Albumin: 4.5 g/dL (ref 3.6–5.1)
Alkaline phosphatase (APISO): 76 U/L (ref 31–125)
Bilirubin, Direct: 0.2 mg/dL (ref 0.0–0.2)
Globulin: 1.9 g/dL (calc) (ref 1.9–3.7)
Indirect Bilirubin: 0.5 mg/dL (calc) (ref 0.2–1.2)
Total Bilirubin: 0.7 mg/dL (ref 0.2–1.2)
Total Protein: 6.4 g/dL (ref 6.1–8.1)

## 2019-03-17 NOTE — Telephone Encounter (Signed)
Call pt: liver enzymes are much much better. Just a hair of elevation of ALT better than your hx. No concerns.

## 2019-06-02 ENCOUNTER — Encounter: Payer: Self-pay | Admitting: Physician Assistant

## 2019-06-02 ENCOUNTER — Ambulatory Visit: Payer: Managed Care, Other (non HMO) | Admitting: Physician Assistant

## 2019-06-02 ENCOUNTER — Telehealth: Payer: Self-pay | Admitting: Physician Assistant

## 2019-06-02 ENCOUNTER — Ambulatory Visit (HOSPITAL_BASED_OUTPATIENT_CLINIC_OR_DEPARTMENT_OTHER)
Admission: RE | Admit: 2019-06-02 | Discharge: 2019-06-02 | Disposition: A | Discharge status: Home / Self Care | Payer: Managed Care, Other (non HMO) | Source: Ambulatory Visit | Attending: Physician Assistant | Admitting: Physician Assistant

## 2019-06-02 ENCOUNTER — Other Ambulatory Visit: Payer: Self-pay

## 2019-06-02 ENCOUNTER — Encounter (HOSPITAL_BASED_OUTPATIENT_CLINIC_OR_DEPARTMENT_OTHER): Payer: Self-pay

## 2019-06-02 VITALS — BP 125/74 | HR 64 | Temp 98.6°F | Ht 66.0 in | Wt 185.0 lb

## 2019-06-02 DIAGNOSIS — N83201 Unspecified ovarian cyst, right side: Secondary | ICD-10-CM

## 2019-06-02 DIAGNOSIS — R1084 Generalized abdominal pain: Secondary | ICD-10-CM

## 2019-06-02 DIAGNOSIS — R110 Nausea: Secondary | ICD-10-CM

## 2019-06-02 DIAGNOSIS — R1013 Epigastric pain: Secondary | ICD-10-CM

## 2019-06-02 DIAGNOSIS — R198 Other specified symptoms and signs involving the digestive system and abdomen: Secondary | ICD-10-CM

## 2019-06-02 MED ORDER — OMEPRAZOLE 40 MG PO CPDR: 40.0000 mg | DELAYED_RELEASE_CAPSULE | Freq: Every day | ORAL | 3 refills | Status: DC

## 2019-06-02 MED ORDER — SUCRALFATE 1 G PO TABS: 1.0000 g | ORAL_TABLET | Freq: Three times a day (TID) | ORAL | 0 refills | Status: DC

## 2019-06-02 MED ORDER — IOHEXOL 300 MG/ML  SOLN
100.0000 mL | Freq: Once | INTRAMUSCULAR | Status: AC | PRN
  Administered 2019-06-02: 100 mL via INTRAVENOUS

## 2019-06-02 MED ORDER — HYOSCYAMINE SULFATE 0.125 MG SL SUBL
0.2500 mg | SUBLINGUAL_TABLET | Freq: Once | SUBLINGUAL | Status: AC
  Administered 2019-06-02: 0.25 mg via SUBLINGUAL

## 2019-06-02 MED ORDER — LIDOCAINE VISCOUS HCL 2 % MT SOLN
15.0000 mL | Freq: Once | OROMUCOSAL | Status: AC
  Administered 2019-06-02: 17:00:00 15 mL via ORAL

## 2019-06-02 MED ORDER — TRAMADOL HCL 50 MG PO TABS: 50.0000 mg | ORAL_TABLET | Freq: Four times a day (QID) | ORAL | 0 refills | Status: AC | PRN

## 2019-06-02 MED ORDER — IOHEXOL 300 MG/ML  SOLN: 100.0000 mL | Freq: Once | INTRAMUSCULAR | Status: DC | PRN

## 2019-06-02 MED ORDER — ALUM & MAG HYDROXIDE-SIMETH 200-200-20 MG/5ML PO SUSP
30.0000 mL | Freq: Once | ORAL | Status: AC
  Administered 2019-06-02: 17:00:00 30 mL via ORAL

## 2019-06-02 NOTE — Progress Notes (Signed)
l °

## 2019-06-02 NOTE — Addendum Note (Signed)
Addended by: Donella Stade on: 06/02/2019 09:11 PM   Modules accepted: Orders

## 2019-06-02 NOTE — Patient Instructions (Signed)
Gastritis, Adult    Gastritis is swelling (inflammation) of the stomach. Gastritis can develop quickly (acute). It can also develop slowly over time (chronic). It is important to get help for this condition. If you do not get help, your stomach can bleed, and you can get sores (ulcers) in your stomach.  What are the causes?  This condition may be caused by:   Germs that get to your stomach.   Drinking too much alcohol.   Medicines you are taking.   Too much acid in the stomach.   A disease of the intestines or stomach.   Stress.   An allergic reaction.   Crohn's disease.   Some cancer treatments (radiation).  Sometimes the cause of this condition is not known.  What are the signs or symptoms?  Symptoms of this condition include:   Pain in your stomach.   A burning feeling in your stomach.   Feeling sick to your stomach (nauseous).   Throwing up (vomiting).   Feeling too full after you eat.   Weight loss.   Bad breath.   Throwing up blood.   Blood in your poop (stool).  How is this diagnosed?  This condition may be diagnosed with:   Your medical history and symptoms.   A physical exam.   Tests. These can include:  ? Blood tests.  ? Stool tests.  ? A procedure to look inside your stomach (upper endoscopy).  ? A test in which a sample of tissue is taken for testing (biopsy).  How is this treated?  Treatment for this condition depends on what caused it. You may be given:   Antibiotic medicine, if your condition was caused by germs.   H2 blockers and similar medicines, if your condition was caused by too much acid.  Follow these instructions at home:  Medicines   Take over-the-counter and prescription medicines only as told by your doctor.   If you were prescribed an antibiotic medicine, take it as told by your doctor. Do not stop taking it even if you start to feel better.  Eating and drinking     Eat small meals often, instead of large meals.   Avoid foods and drinks that make your symptoms  worse.   Drink enough fluid to keep your pee (urine) pale yellow.  Alcohol use   Do not drink alcohol if:  ? Your doctor tells you not to drink.  ? You are pregnant, may be pregnant, or are planning to become pregnant.   If you drink alcohol:  ? Limit your use to:   0-1 drink a day for women.   0-2 drinks a day for men.  ? Be aware of how much alcohol is in your drink. In the [place] one drink equals one 12 oz bottle of beer (355 mL), one 5 oz glass of wine (148 mL), or one 1 oz glass of hard liquor (44 mL).  General instructions   Talk with your doctor about ways to manage stress. You can exercise or do deep breathing, meditation, or yoga.   Do not smoke or use products that have nicotine or tobacco. If you need help quitting, ask your doctor.   Keep all follow-up visits as told by your doctor. This is important.  Contact a doctor if:   Your symptoms get worse.   Your symptoms go away and then come back.  Get help right away if:   You throw up blood or something that looks like coffee   grounds.   You have black or dark red poop.   You throw up any time you try to drink fluids.   Your stomach pain gets worse.   You have a fever.   You do not feel better after one week.  Summary   Gastritis is swelling (inflammation) of the stomach.   You must get help for this condition. If you do not get help, your stomach can bleed, and you can get sores (ulcers).   This condition is diagnosed with medical history, physical exam, or tests.   You can be treated with medicines for germs or medicines to block too much acid in your stomach.  This information is not intended to replace advice given to you by your health care provider. Make sure you discuss any questions you have with your health care provider.  Document Released: 05/19/2008 Document Revised: 04/20/2018 Document Reviewed: 04/20/2018  Elsevier Interactive Patient Education  2019 Elsevier Inc.

## 2019-06-02 NOTE — Progress Notes (Signed)
Subjective:    Patient ID: Anna Robinson, female    DOB: [birth year], 48 y.o.   MRN: [id number]  HPI Pt is a 48 yo female with hx of elevated liver enzymes who presents to the clinic with abdominal pain and cramping for 2 weeks. Seems to keep getting worse. She is nauseated but not vomiting. Pain seems to be worse in epigastric area and then wraps around right upper quadrant and then radiates into right lower quadrant. She denies any diarrhea. She has hx of constipation. No fever, chills, sOB, cough. She admits to more GERD symptoms. She has been taking tums but not taken today. Food does not seem to effect it. She has been eating. No alcohol use. No NsAID use.   .. Active Ambulatory Problems    Diagnosis Date Noted  . Muscle cramps 03/02/2015  . No energy 01/15/2018  . Epigastric pain 02/15/2018  . Vitamin D insufficiency 01/26/2019  . Elevated fasting glucose 01/26/2019  . Dyslipidemia 01/26/2019  . Elevated liver enzymes 02/14/2019   Resolved Ambulatory Problems    Diagnosis Date Noted  . Right lower quadrant abdominal tenderness 03/02/2015  . Diarrhea 02/15/2018  . Dehydration, mild 02/15/2018   Past Medical History:  Diagnosis Date  . Anxiety   . Seasonal allergies      Review of Systems See HPI.     Objective:   Physical Exam Vitals signs reviewed.  Constitutional:      Appearance: She is well-developed.  HENT:     Head: Normocephalic.  Cardiovascular:     Rate and Rhythm: Normal rate and regular rhythm.  Pulmonary:     Effort: Pulmonary effort is normal.     Breath sounds: Normal breath sounds.  Abdominal:     General: Abdomen is flat. Bowel sounds are normal. There is no distension. There are no signs of injury.     Palpations: Abdomen is soft. There is no mass.     Tenderness: There is abdominal tenderness in the right upper quadrant, right lower quadrant and left lower quadrant. There is left CVA tenderness and guarding. There is no right CVA tenderness or  rebound. Negative signs include Murphy's sign and psoas sign.     Hernia: No hernia is present.  Neurological:     General: No focal deficit present.     Mental Status: She is alert.  Psychiatric:        Mood and Affect: Mood normal.           Assessment & Plan:  Marland KitchenMarland KitchenApril was seen today for abdominal pain.  Diagnoses and all orders for this visit:  Epigastric pain -     CBC with Differential/Platelet -     COMPLETE METABOLIC PANEL WITH GFR -     Lipase -     H. pylori breath test -     alum & mag hydroxide-simeth (MAALOX/MYLANTA) 200-200-20 MG/5ML suspension 30 mL -     lidocaine (XYLOCAINE) 2 % viscous mouth solution 15 mL -     hyoscyamine (LEVSIN SL) SL tablet 0.25 mg  Nausea -     CBC with Differential/Platelet -     COMPLETE METABOLIC PANEL WITH GFR -     Lipase -     H. pylori breath test -     alum & mag hydroxide-simeth (MAALOX/MYLANTA) 200-200-20 MG/5ML suspension 30 mL -     lidocaine (XYLOCAINE) 2 % viscous mouth solution 15 mL -     hyoscyamine (LEVSIN SL) SL tablet 0.25  mg  Abdominal guarding -     CBC with Differential/Platelet -     COMPLETE METABOLIC PANEL WITH GFR -     Lipase  Generalized abdominal pain -     CT Abdomen Pelvis W Contrast   Unclear etiology gastritis, PUD, pancreatitis, diverticulitis, cholecystitis, peritonitis. Will get cbc, cmp, lipase.  Breath test done in office today.  GI cocktail given in office.  Get CT of abd and pelvis, stat.   Will call with results and treatment plan.

## 2019-06-02 NOTE — Telephone Encounter (Signed)
Called and got prior authorization for CT abd/pelvis with contrast 74177. Authrization # [id number]. Called [facility] and informed them of authorization number. KG LPN

## 2019-06-02 NOTE — Progress Notes (Signed)
Called patient. Right ovarian cyst. Sent tramadol for pain. Reassurance given. Treated for gastritis with carafate and omeprazole. Follow up in 2 weeks.

## 2019-06-03 LAB — H. PYLORI BREATH TEST: H. pylori Breath Test: NOT DETECTED

## 2019-06-04 LAB — CBC WITH DIFFERENTIAL/PLATELET
Absolute Monocytes: 512 cells/uL (ref 200–950)
Basophils Absolute: 42 cells/uL (ref 0–200)
Basophils Relative: 0.5 %
Eosinophils Absolute: 50 cells/uL (ref 15–500)
Eosinophils Relative: 0.6 %
HCT: 44.8 % (ref 35.0–45.0)
Hemoglobin: 15.3 g/dL (ref 11.7–15.5)
Lymphs Abs: 2780 cells/uL (ref 850–3900)
MCH: 29.7 pg (ref 27.0–33.0)
MCHC: 34.2 g/dL (ref 32.0–36.0)
MCV: 86.8 fL (ref 80.0–100.0)
MPV: 10 fL (ref 7.5–12.5)
Monocytes Relative: 6.1 %
Neutro Abs: 5015 cells/uL (ref 1500–7800)
Neutrophils Relative %: 59.7 %
Platelets: 214 10*3/uL (ref 140–400)
RBC: 5.16 10*6/uL — ABNORMAL HIGH (ref 3.80–5.10)
RDW: 13.2 % (ref 11.0–15.0)
Total Lymphocyte: 33.1 %
WBC: 8.4 10*3/uL (ref 3.8–10.8)

## 2019-06-04 LAB — COMPLETE METABOLIC PANEL WITH GFR
AG Ratio: 1.9 (calc) (ref 1.0–2.5)
ALT: 27 U/L (ref 6–29)
AST: 18 U/L (ref 10–35)
Albumin: 4.6 g/dL (ref 3.6–5.1)
Alkaline phosphatase (APISO): 96 U/L (ref 31–125)
BUN: 9 mg/dL (ref 7–25)
CO2: 26 mmol/L (ref 20–32)
Calcium: 9.6 mg/dL (ref 8.6–10.2)
Chloride: 105 mmol/L (ref 98–110)
Creat: 0.84 mg/dL (ref 0.50–1.10)
GFR, Est African American: 95 mL/min/{1.73_m2} (ref >=60)
GFR, Est Non African American: 82 mL/min/{1.73_m2} (ref >=60)
Globulin: 2.4 g/dL (calc) (ref 1.9–3.7)
Glucose, Bld: 101 mg/dL — ABNORMAL HIGH (ref 65–99)
Potassium: 4.1 mmol/L (ref 3.5–5.3)
Sodium: 139 mmol/L (ref 135–146)
Total Bilirubin: 1.2 mg/dL (ref 0.2–1.2)
Total Protein: 7 g/dL (ref 6.1–8.1)

## 2019-06-04 LAB — LIPASE: Lipase: 9 U/L (ref 7–60)

## 2019-06-06 NOTE — Progress Notes (Signed)
Call pt: pancreatic enzymes look good. WBC looks good. Liver and kidney look great. No h.pylori. how are your abdominal symptoms today?

## 2019-06-06 NOTE — Addendum Note (Signed)
Addended byDonella Stade on: 06/06/2019 01:03 PM   Modules accepted: Orders

## 2019-06-06 NOTE — Progress Notes (Signed)
Made referral for right ovarian cyst to be evaluated by GYN.

## 2019-06-14 ENCOUNTER — Other Ambulatory Visit: Payer: Self-pay | Admitting: *Deleted

## 2019-06-14 DIAGNOSIS — R102 Pelvic and perineal pain: Secondary | ICD-10-CM

## 2019-06-14 NOTE — Progress Notes (Signed)
Pt scheduled for TVU to R/O ovarian cyst per Dr Gala Romney.

## 2019-06-15 ENCOUNTER — Ambulatory Visit: Payer: Managed Care, Other (non HMO) | Admitting: Physician Assistant

## 2019-06-20 ENCOUNTER — Ambulatory Visit (INDEPENDENT_AMBULATORY_CARE_PROVIDER_SITE_OTHER): Payer: Managed Care, Other (non HMO)

## 2019-06-20 ENCOUNTER — Other Ambulatory Visit: Payer: Self-pay

## 2019-06-20 DIAGNOSIS — R102 Pelvic and perineal pain: Secondary | ICD-10-CM | POA: Diagnosis not present

## 2019-06-26 ENCOUNTER — Other Ambulatory Visit: Payer: Self-pay | Admitting: Physician Assistant

## 2019-07-04 ENCOUNTER — Ambulatory Visit: Payer: Managed Care, Other (non HMO) | Admitting: Obstetrics & Gynecology

## 2019-07-04 ENCOUNTER — Other Ambulatory Visit: Payer: Self-pay

## 2019-07-04 ENCOUNTER — Encounter: Payer: Self-pay | Admitting: Obstetrics & Gynecology

## 2019-07-04 VITALS — BP 114/73 | HR 72 | Resp 16 | Ht 65.5 in | Wt 186.0 lb

## 2019-07-04 DIAGNOSIS — N83201 Unspecified ovarian cyst, right side: Secondary | ICD-10-CM | POA: Diagnosis not present

## 2019-07-04 MED ORDER — NORGESTREL-ETHINYL ESTRADIOL 0.3-30 MG-MCG PO TABS: 1.0000 | ORAL_TABLET | Freq: Every day | ORAL | 11 refills | Status: DC

## 2019-07-04 NOTE — Progress Notes (Signed)
Subjective:    Patient ID: Anna Robinson, female    DOB: 1971-06-29, 48 y.o.   MRN: [id number]  HPI 47yo married P2 (24 and 62 yo kids) here today for follow up after a CT and u/s showed a right ovarian cyst. She went to see [facility] for RLQ pain, now present for about 6 weeks. She has tried IBU and tylenol with some help. She was given tramadol by [facility] and that has helped.    Review of Systems She has had a hysterectomy.    Objective:   Physical Exam  Breathing, conversing, and ambulating normally Well nourished, well hydrated White female, no apparent distress Abd- benign Bimanual exam reveals tenderness in the right, no large masses      Assessment & Plan:  RLQ pain and 3.5 cm right ovarian simple cyst. I have offered watchwaiting, OCPs, and laparoscopy At this time, she opts for OCPs Lo ovral prescribed BP check if she is on this for more than a month F/U u/s ordered for about 8 weeks

## 2019-07-26 ENCOUNTER — Other Ambulatory Visit: Payer: Self-pay | Admitting: Physician Assistant

## 2019-10-31 ENCOUNTER — Telehealth: Payer: Self-pay | Admitting: Neurology

## 2019-10-31 NOTE — Telephone Encounter (Signed)
Patient left vm asking for network PCP number to sign up for benefits for her job. Left message on machine for patient to call back to get more information.

## 2020-01-31 ENCOUNTER — Encounter: Payer: Self-pay | Admitting: Physician Assistant

## 2020-01-31 ENCOUNTER — Ambulatory Visit (INDEPENDENT_AMBULATORY_CARE_PROVIDER_SITE_OTHER): Payer: Managed Care, Other (non HMO) | Admitting: Physician Assistant

## 2020-01-31 ENCOUNTER — Other Ambulatory Visit: Payer: Self-pay

## 2020-01-31 ENCOUNTER — Other Ambulatory Visit: Payer: Self-pay | Admitting: Physician Assistant

## 2020-01-31 VITALS — BP 123/63 | HR 74 | Ht 65.5 in | Wt 194.0 lb

## 2020-01-31 DIAGNOSIS — Z0000 Encounter for general adult medical examination without abnormal findings: Secondary | ICD-10-CM | POA: Diagnosis not present

## 2020-01-31 DIAGNOSIS — Z13220 Encounter for screening for lipoid disorders: Secondary | ICD-10-CM

## 2020-01-31 DIAGNOSIS — Z1211 Encounter for screening for malignant neoplasm of colon: Secondary | ICD-10-CM

## 2020-01-31 DIAGNOSIS — Z6830 Body mass index (BMI) 30.0-30.9, adult: Secondary | ICD-10-CM

## 2020-01-31 DIAGNOSIS — Z23 Encounter for immunization: Secondary | ICD-10-CM

## 2020-01-31 DIAGNOSIS — Z1231 Encounter for screening mammogram for malignant neoplasm of breast: Secondary | ICD-10-CM

## 2020-01-31 DIAGNOSIS — Z131 Encounter for screening for diabetes mellitus: Secondary | ICD-10-CM

## 2020-01-31 DIAGNOSIS — E6609 Other obesity due to excess calories: Secondary | ICD-10-CM | POA: Diagnosis not present

## 2020-01-31 MED ORDER — PHENTERMINE HCL 37.5 MG PO TABS: 37.5000 mg | ORAL_TABLET | Freq: Every day | ORAL | 0 refills | Status: DC

## 2020-01-31 NOTE — Patient Instructions (Signed)
Contrave qsymia saxenda   Health Maintenance, Female Adopting a healthy lifestyle and getting preventive care are important in promoting health and wellness. Ask your health care provider about:  The right schedule for you to have regular tests and exams.  Things you can do on your own to prevent diseases and keep yourself healthy. What should I know about diet, weight, and exercise? Eat a healthy diet   Eat a diet that includes plenty of vegetables, fruits, low-fat dairy products, and lean protein.  Do not eat a lot of foods that are high in solid fats, added sugars, or sodium. Maintain a healthy weight Body mass index (BMI) is used to identify weight problems. It estimates body fat based on height and weight. Your health care provider can help determine your BMI and help you achieve or maintain a healthy weight. Get regular exercise Get regular exercise. This is one of the most important things you can do for your health. Most adults should:  Exercise for at least 150 minutes each week. The exercise should increase your heart rate and make you sweat (moderate-intensity exercise).  Do strengthening exercises at least twice a week. This is in addition to the moderate-intensity exercise.  Spend less time sitting. Even light physical activity can be beneficial. Watch cholesterol and blood lipids Have your blood tested for lipids and cholesterol at 51 years of age, then have this test every 5 years. Have your cholesterol levels checked more often if:  Your lipid or cholesterol levels are high.  You are older than 51 years of age.  You are at high risk for heart disease. What should I know about cancer screening? Depending on your health history and family history, you may need to have cancer screening at various ages. This may include screening for:  Breast cancer.  Cervical cancer.  Colorectal cancer.  Skin cancer.  Lung cancer. What should I know about heart disease,  diabetes, and high blood pressure? Blood pressure and heart disease  High blood pressure causes heart disease and increases the risk of stroke. This is more likely to develop in people who have high blood pressure readings, are of African descent, or are overweight.  Have your blood pressure checked: ? Every 3-5 years if you are 50-1 years of age. ? Every year if you are 68 years old or older. Diabetes Have regular diabetes screenings. This checks your fasting blood sugar level. Have the screening done:  Once every three years after age 29 if you are at a normal weight and have a low risk for diabetes.  More often and at a younger age if you are overweight or have a high risk for diabetes. What should I know about preventing infection? Hepatitis B If you have a higher risk for hepatitis B, you should be screened for this virus. Talk with your health care provider to find out if you are at risk for hepatitis B infection. Hepatitis C Testing is recommended for:  Everyone born from [birth year] through 1967.  Anyone with known risk factors for hepatitis C. Sexually transmitted infections (STIs)  Get screened for STIs, including gonorrhea and chlamydia, if: ? You are sexually active and are younger than 51 years of age. ? You are older than 51 years of age and your health care provider tells you that you are at risk for this type of infection. ? Your sexual activity has changed since you were last screened, and you are at increased risk for chlamydia or gonorrhea. Ask  gonorrhea. Ask your health care provider if you are at risk.  Ask your health care provider about whether you are at high risk for HIV. Your health care provider may recommend a prescription medicine to help prevent HIV infection. If you choose to take medicine to prevent HIV, you should first get tested for HIV. You should then be tested every 3 months for as long as you are taking the medicine. Pregnancy  If you are about to stop having your  period (premenopausal) and you may become pregnant, seek counseling before you get pregnant.  Take 400 to 800 micrograms (mcg) of folic acid every day if you become pregnant.  Ask for birth control (contraception) if you want to prevent pregnancy. Osteoporosis and menopause Osteoporosis is a disease in which the bones lose minerals and strength with aging. This can result in bone fractures. If you are 65 years old or older, or if you are at risk for osteoporosis and fractures, ask your health care provider if you should:  Be screened for bone loss.  Take a calcium or vitamin D supplement to lower your risk of fractures.  Be given hormone replacement therapy (HRT) to treat symptoms of menopause. Follow these instructions at home: Lifestyle  Do not use any products that contain nicotine or tobacco, such as cigarettes, e-cigarettes, and chewing tobacco. If you need help quitting, ask your health care provider.  Do not use street drugs.  Do not share needles.  Ask your health care provider for help if you need support or information about quitting drugs. Alcohol use  Do not drink alcohol if: ? Your health care provider tells you not to drink. ? You are pregnant, may be pregnant, or are planning to become pregnant.  If you drink alcohol: ? Limit how much you use to 0-1 drink a day. ? Limit intake if you are breastfeeding.  Be aware of how much alcohol is in your drink. In the [place] one drink equals one 12 oz bottle of beer (355 mL), one 5 oz glass of wine (148 mL), or one 1 oz glass of hard liquor (44 mL). General instructions  Schedule regular health, dental, and eye exams.  Stay current with your vaccines.  Tell your health care provider if: ? You often feel depressed. ? You have ever been abused or do not feel safe at home. Summary  Adopting a healthy lifestyle and getting preventive care are important in promoting health and wellness.  Follow your health care provider's  instructions about healthy diet, exercising, and getting tested or screened for diseases.  Follow your health care provider's instructions on monitoring your cholesterol and blood pressure. This information is not intended to replace advice given to you by your health care provider. Make sure you discuss any questions you have with your health care provider. Document Revised: 11/24/2018 Document Reviewed: 11/24/2018 Elsevier Patient Education  2020 Elsevier Inc.  

## 2020-01-31 NOTE — Progress Notes (Signed)
Subjective:     Anna Robinson is a 49 y.o. female and is here for a comprehensive physical exam. The patient reports frustration over reported~30 pound weight gain in the past year and would like to start on a weight loss medication. She admits to previously being on phentermine with no side effects. She also admits to 30 minutes of exercise daily, but does state she does not consistently eat a healthy diet. Patient denies excessive fatigue, chest pain, abdominal pain, difficulty breathing. Otherwise, patient has no other complaints and is medication compliant.  Social History   Socioeconomic History  . Marital status: Single    Spouse name: Not on file  . Number of children: Not on file  . Years of education: Not on file  . Highest education level: Not on file  Occupational History  . Not on file  Tobacco Use  . Smoking status: Never Smoker  . Smokeless tobacco: Never Used  Substance and Sexual Activity  . Alcohol use: Not on file  . Drug use: No  . Sexual activity: Yes    Partners: Female  Other Topics Concern  . Not on file  Social History Narrative  . Not on file   Social Determinants of Health   Financial Resource Strain:   . Difficulty of Paying Living Expenses: Not on file  Food Insecurity:   . Worried About Charity fundraiser in the Last Year: Not on file  . Ran Out of Food in the Last Year: Not on file  Transportation Needs:   . Lack of Transportation (Medical): Not on file  . Lack of Transportation (Non-Medical): Not on file  Physical Activity:   . Days of Exercise per Week: Not on file  . Minutes of Exercise per Session: Not on file  Stress:   . Feeling of Stress : Not on file  Social Connections:   . Frequency of Communication with Friends and Family: Not on file  . Frequency of Social Gatherings with Friends and Family: Not on file  . Attends Religious Services: Not on file  . Active Member of Clubs or Organizations: Not on file  . Attends Theatre manager Meetings: Not on file  . Marital Status: Not on file  Intimate Partner Violence:   . Fear of Current or Ex-Partner: Not on file  . Emotionally Abused: Not on file  . Physically Abused: Not on file  . Sexually Abused: Not on file   Health Maintenance  Topic Date Due  . HIV Screening  11/06/1984  . COLONOSCOPY  11/07/2019  . MAMMOGRAM  02/10/2020  . TETANUS/TDAP  02/27/2025  . INFLUENZA VACCINE  Completed    The following portions of the patient's history were reviewed and updated as appropriate: allergies, current medications, past family history, past medical history, past social history, past surgical history and problem list.  Review of Systems Pertinent items noted in HPI and remainder of comprehensive ROS otherwise negative.   Objective:    BP 123/63   Pulse 74   Ht 5' 5.5" (1.664 m)   Wt 194 lb (88 kg)   SpO2 96%   BMI 31.79 kg/m  General appearance: alert and cooperative Head: Normocephalic, without obvious abnormality, atraumatic Eyes: conjunctivae/corneas clear. PERRL, EOM's intact. Fundi benign. Ears: normal TM's and external ear canals both ears Neck: no adenopathy, no carotid bruit, no JVD, supple, symmetrical, trachea midline and thyroid not enlarged, symmetric, no tenderness/mass/nodules Back: symmetric, no curvature. ROM normal. No CVA tenderness.  Assessment:    Healthy female exam.      Plan:    Marland KitchenMarland KitchenApril was seen today for annual exam.  Diagnoses and all orders for this visit:  Routine physical examination -     COMPLETE METABOLIC PANEL WITH GFR -     Lipid Panel w/reflex Direct LDL -     Hemoglobin A1c -     TSH  Need for shingles vaccine -     Varicella-zoster vaccine IM  Colon cancer screening -     Ambulatory referral to Gastroenterology  Class 1 obesity due to excess calories without serious comorbidity with body mass index (BMI) of 30.0 to 30.9 in adult -     phentermine (ADIPEX-P) 37.5 MG tablet; Take 1 tablet (37.5 mg  total) by mouth daily before breakfast. -     TSH  Screening for diabetes mellitus -     COMPLETE METABOLIC PANEL WITH GFR -     Hemoglobin A1c  Screening for lipid disorders -     Lipid Panel w/reflex Direct LDL   .[name] Depression screen [facility] 2/9 01/31/2020 01/24/2019 08/10/2018 01/15/2018  Decreased Interest 1 2 0 0  Down, Depressed, Hopeless 1 1 0 0  PHQ - 2 Score 2 3 0 0  Altered sleeping 3 2 3  -  Tired, decreased energy 1 3 0 -  Change in appetite 2 1 1  -  Feeling bad or failure about yourself  1 0 0 -  Trouble concentrating 0 0 0 -  Moving slowly or fidgety/restless 0 0 0 -  Suicidal thoughts 0 0 0 -  PHQ-9 Score 9 9 4  -  Difficult doing work/chores Not difficult at all Somewhat difficult Not difficult at all -   .. Discussed 150 minutes of exercise a week.  Encouraged vitamin D 1000 units and Calcium 1300mg  or 4 servings of dairy a day.  Fasting labs ordered.  Mammogram to be done at end of month.  No need for pap with hysterectomy. Declined STD testing.  Colonoscopy to but ordered.  Flu UTD.  shingrix started today.   .Discussed low carb diet with 1500 calories and 80g of protein.  Exercising at least 150 minutes a week.  My Fitness Pal could be a .  Discussed 16:8 fasting and gave HO.  Phentermine tried in past gave for jump start.  Follow up in 2 months.  Discussed other weight loss medications.   See After Visit Summary for Counseling Recommendations

## 2020-02-01 ENCOUNTER — Encounter: Payer: Self-pay | Admitting: Gastroenterology

## 2020-02-01 DIAGNOSIS — E6609 Other obesity due to excess calories: Secondary | ICD-10-CM | POA: Insufficient documentation

## 2020-02-09 ENCOUNTER — Other Ambulatory Visit: Payer: Self-pay

## 2020-02-09 ENCOUNTER — Ambulatory Visit (AMBULATORY_SURGERY_CENTER): Payer: Managed Care, Other (non HMO) | Admitting: *Deleted

## 2020-02-09 VITALS — Temp 98.0°F | Ht 65.5 in | Wt 190.0 lb

## 2020-02-09 DIAGNOSIS — Z1211 Encounter for screening for malignant neoplasm of colon: Secondary | ICD-10-CM

## 2020-02-09 DIAGNOSIS — Z01818 Encounter for other preprocedural examination: Secondary | ICD-10-CM

## 2020-02-09 DIAGNOSIS — Z1159 Encounter for screening for other viral diseases: Secondary | ICD-10-CM

## 2020-02-09 MED ORDER — SUPREP BOWEL PREP KIT 17.5-3.13-1.6 GM/177ML PO SOLN: 1.0000 | Freq: Once | ORAL | 0 refills | Status: AC

## 2020-02-09 NOTE — Progress Notes (Signed)
No egg or soy allergy known to patient  No with past sedation with any surgeries  or procedures of ponv , no intubation problems   diet pills per patient- pt is on phentermine  No home 02 use per patient  No blood thinners per patient  Pt states  issues with constipation- only has a bm once a week after taking magnesium  -will do a 2 day suprep for colon 3-5  No A fib or A flutter  EMMI video sent to pt's e mail   Due to the COVID-19 pandemic we are asking patients to follow these guidelines. Please only bring one care partner. Please be aware that your care partner may wait in the car in the parking lot or if they feel like they will be too hot to wait in the car, they may wait in the lobby on the 4th floor. All care partners are required to wear a mask the entire time (we do not have any that we can provide them), they need to practice social distancing, and we will do a Covid check for all patient's and care partners when you arrive. Also we will check their temperature and your temperature. If the care partner waits in their car they need to stay in the parking lot the entire time and we will call them on their cell phone when the patient is ready for discharge so they can bring the car to the front of the building. Also all patient's will need to wear a mask into building.

## 2020-02-10 LAB — COMPLETE METABOLIC PANEL WITH GFR
AG Ratio: 1.8 (calc) (ref 1.0–2.5)
ALT: 28 U/L (ref 6–29)
AST: 20 U/L (ref 10–35)
Albumin: 4.4 g/dL (ref 3.6–5.1)
Alkaline phosphatase (APISO): 82 U/L (ref 37–153)
BUN: 15 mg/dL (ref 7–25)
CO2: 24 mmol/L (ref 20–32)
Calcium: 9.2 mg/dL (ref 8.6–10.4)
Chloride: 105 mmol/L (ref 98–110)
Creat: 0.94 mg/dL (ref 0.50–1.05)
GFR, Est African American: 82 mL/min/{1.73_m2} (ref >=60)
GFR, Est Non African American: 71 mL/min/{1.73_m2} (ref >=60)
Globulin: 2.4 g/dL (calc) (ref 1.9–3.7)
Glucose, Bld: 103 mg/dL — ABNORMAL HIGH (ref 65–99)
Potassium: 3.9 mmol/L (ref 3.5–5.3)
Sodium: 138 mmol/L (ref 135–146)
Total Bilirubin: 0.9 mg/dL (ref 0.2–1.2)
Total Protein: 6.8 g/dL (ref 6.1–8.1)

## 2020-02-10 LAB — TSH: TSH: 1.39 mIU/L

## 2020-02-10 LAB — LIPID PANEL W/REFLEX DIRECT LDL
Cholesterol: 170 mg/dL (ref <200)
HDL: 32 mg/dL — ABNORMAL LOW (ref >=50)
LDL Cholesterol (Calc): 116 mg/dL (calc) — ABNORMAL HIGH
Non-HDL Cholesterol (Calc): 138 mg/dL (calc) — ABNORMAL HIGH (ref <130)
Total CHOL/HDL Ratio: 5.3 (calc) — ABNORMAL HIGH (ref <5.0)
Triglycerides: 110 mg/dL (ref <150)

## 2020-02-10 LAB — HEMOGLOBIN A1C
Hgb A1c MFr Bld: 5.5 % of total Hgb (ref <5.7)
Mean Plasma Glucose: 111 (calc)
eAG (mmol/L): 6.2 (calc)

## 2020-02-10 NOTE — Progress Notes (Signed)
Call pt:   Thyroid great. Kidney and liver look great.  Slightly elevated sugars but a1C normal range.  LDL much improved from last year.  Would love to see HDL higher.  Increasing exercise could help with this.

## 2020-02-14 ENCOUNTER — Ambulatory Visit (INDEPENDENT_AMBULATORY_CARE_PROVIDER_SITE_OTHER): Payer: Managed Care, Other (non HMO)

## 2020-02-14 ENCOUNTER — Other Ambulatory Visit: Payer: Self-pay

## 2020-02-14 DIAGNOSIS — Z1159 Encounter for screening for other viral diseases: Secondary | ICD-10-CM

## 2020-02-15 ENCOUNTER — Encounter: Payer: Self-pay | Admitting: Gastroenterology

## 2020-02-15 LAB — SARS CORONAVIRUS 2 (TAT 6-24 HRS): SARS Coronavirus 2: NEGATIVE

## 2020-02-17 ENCOUNTER — Other Ambulatory Visit: Payer: Self-pay

## 2020-02-17 ENCOUNTER — Encounter: Payer: Self-pay | Admitting: Gastroenterology

## 2020-02-17 ENCOUNTER — Ambulatory Visit (AMBULATORY_SURGERY_CENTER): Payer: Managed Care, Other (non HMO) | Admitting: Gastroenterology

## 2020-02-17 VITALS — BP 110/61 | HR 45 | Temp 96.2°F | Resp 15 | Ht 65.5 in | Wt 190.0 lb

## 2020-02-17 DIAGNOSIS — Z1211 Encounter for screening for malignant neoplasm of colon: Secondary | ICD-10-CM | POA: Diagnosis not present

## 2020-02-17 MED ORDER — SODIUM CHLORIDE 0.9 % IV SOLN: 500.0000 mL | Freq: Once | INTRAVENOUS | Status: DC

## 2020-02-17 NOTE — Patient Instructions (Signed)
YOU HAD AN ENDOSCOPIC PROCEDURE TODAY AT THE [facility]:   Refer to the procedure report that was given to you for any specific questions about what was found during the examination.  If the procedure report does not answer your questions, please call your gastroenterologist to clarify.  If you requested that your care partner not be given the details of your procedure findings, then the procedure report has been included in a sealed envelope for you to review at your convenience later.  YOU SHOULD EXPECT: Some feelings of bloating in the abdomen. Passage of more gas than usual.  Walking can help get rid of the air that was put into your GI tract during the procedure and reduce the bloating. If you had a lower endoscopy (such as a colonoscopy or flexible sigmoidoscopy) you may notice spotting of blood in your stool or on the toilet paper. If you underwent a bowel prep for your procedure, you may not have a normal bowel movement for a few days.  Please Note:  You might notice some irritation and congestion in your nose or some drainage.  This is from the oxygen used during your procedure.  There is no need for concern and it should clear up in a day or so.  SYMPTOMS TO REPORT IMMEDIATELY:   Following lower endoscopy (colonoscopy or flexible sigmoidoscopy):  Excessive amounts of blood in the stool  Significant tenderness or worsening of abdominal pains  Swelling of the abdomen that is new, acute  Fever of 100F or higher  For urgent or emergent issues, a gastroenterologist can be reached at any hour by calling [phone]. Do not use MyChart messaging for urgent concerns.    DIET:  We do recommend a small meal at first, but then you may proceed to your regular diet.  Drink plenty of fluids but you should avoid alcoholic beverages for 24 hours.  ACTIVITY:  You should plan to take it easy for the rest of today and you should NOT DRIVE or use heavy machinery until tomorrow (because  of the sedation medicines used during the test).    FOLLOW UP: Our staff will call the number listed on your records 48-72 hours following your procedure to check on you and address any questions or concerns that you may have regarding the information given to you following your procedure. If we do not reach you, we will leave a message.  We will attempt to reach you two times.  During this call, we will ask if you have developed any symptoms of COVID 19. If you develop any symptoms (ie: fever, flu-like symptoms, shortness of breath, cough etc.) before then, please call [phone].  If you test positive for Covid 19 in the 2 weeks post procedure, please call and report this information to us.    If any biopsies were taken you will be contacted by phone or by letter within the next 1-3 weeks.  Please call us at [phone] if you have not heard about the biopsies in 3 weeks.    SIGNATURES/CONFIDENTIALITY: You and/or your care partner have signed paperwork which will be entered into your electronic medical record.  These signatures attest to the fact that that the information above on your After Visit Summary has been reviewed and is understood.  Full responsibility of the confidentiality of this discharge information lies with you and/or your care-partner. 

## 2020-02-17 NOTE — Progress Notes (Signed)
A and O x3. Report to RN. Tolerated MAC anesthesia well.

## 2020-02-17 NOTE — Progress Notes (Signed)
Pt's states no medical or surgical changes since previsit or office visit. 

## 2020-02-17 NOTE — Op Note (Signed)
[facility] Patient Name: Anna Robinson Procedure Date: 02/17/2020 8:34 AM MRN: [id number] Endoscopist: Sherilyn Cooter L. Myrtie Neither , MD Age: 49 Referring MD:  Date of Birth: [birth year] Gender: Female Account #: [id number] Procedure:                Colonoscopy Indications:              Screening for colorectal malignant neoplasm, This                            is the patient's first screening colonoscopy (had a                            colonoscopy about 10 years prior for constipation) Medicines:                Monitored Anesthesia Care Procedure:                Pre-Anesthesia Assessment:                           - Prior to the procedure, a History and Physical                            was performed, and patient medications and                            allergies were reviewed. The patient's tolerance of                            previous anesthesia was also reviewed. The risks                            and benefits of the procedure and the sedation                            options and risks were discussed with the patient.                            All questions were answered, and informed consent                            was obtained. Prior Anticoagulants: The patient has                            taken no previous anticoagulant or antiplatelet                            agents. ASA Grade Assessment: II - A patient with                            mild systemic disease. After reviewing the risks                            and benefits, the patient was deemed in  satisfactory condition to undergo the procedure.                           After obtaining informed consent, the colonoscope                            was passed under direct vision. Throughout the                            procedure, the patient's blood pressure, pulse, and                            oxygen saturations were monitored continuously. The   Colonoscope was introduced through the anus and                            advanced to the the cecum, identified by                            appendiceal orifice and ileocecal valve. The                            colonoscopy was performed without difficulty. The                            patient tolerated the procedure well. The quality                            of the bowel preparation was excellent. The                            ileocecal valve, appendiceal orifice, and rectum                            were photographed. The bowel preparation used was                            SUPREP. Scope In: 8:43:21 AM Scope Out: 9:01:07 AM Scope Withdrawal Time: 0 hours 12 minutes 59 seconds  Total Procedure Duration: 0 hours 17 minutes 46 seconds  Findings:                 The perianal and digital rectal examinations were                            normal.                           The entire examined colon appeared normal on direct                            and retroflexion views. Complications:            No immediate complications. Estimated Blood Loss:     Estimated blood loss: none. Impression:               - The entire examined  colon is normal on direct and                            retroflexion views.                           - No specimens collected. Recommendation:           - Patient has a contact number available for                            emergencies. The signs and symptoms of potential                            delayed complications were discussed with the                            patient. Return to normal activities tomorrow.                            Written discharge instructions were provided to the                            patient.                           - Resume previous diet.                           - Continue present medications.                           - Repeat colonoscopy in 10 years for screening                            purposes. Krysten Veronica L.  Myrtie Neither, MD 02/17/2020 9:04:00 AM This report has been signed electronically.

## 2020-02-17 NOTE — Progress Notes (Signed)
JB- Temp CW- Vitals 

## 2020-02-21 ENCOUNTER — Telehealth: Payer: Self-pay | Admitting: *Deleted

## 2020-02-21 NOTE — Telephone Encounter (Signed)
Left message on f/u call 

## 2020-02-21 NOTE — Telephone Encounter (Signed)
  Follow up Call-  Call back number 02/17/2020  Post procedure Call Back phone  # [phone]  Permission to leave phone message Yes  Some recent data might be hidden     Patient questions:  Do you have a fever, pain , or abdominal swelling? No. Pain Score  0 *  Have you tolerated food without any problems? Yes.    Have you been able to return to your normal activities? Yes.    Do you have any questions about your discharge instructions: Diet   No. Medications  No. Follow up visit  No.  Do you have questions or concerns about your Care? No.  Actions: * If pain score is 4 or above: No action needed, pain <4  1. Have you developed a fever since your procedure? NO  2.   Have you had an respiratory symptoms (SOB or cough) since your procedure? NO  3.   Have you tested positive for COVID 19 since your procedure NO  4.   Have you had any family members/close contacts diagnosed with the COVID 19 since your procedure?  NO   If yes to any of these questions please route to Laverna Peace, RN and Jennye Boroughs, RN.

## 2020-03-21 ENCOUNTER — Other Ambulatory Visit: Payer: Self-pay

## 2020-03-21 ENCOUNTER — Ambulatory Visit (INDEPENDENT_AMBULATORY_CARE_PROVIDER_SITE_OTHER): Payer: Managed Care, Other (non HMO)

## 2020-03-21 DIAGNOSIS — Z1231 Encounter for screening mammogram for malignant neoplasm of breast: Secondary | ICD-10-CM | POA: Diagnosis not present

## 2020-03-23 ENCOUNTER — Other Ambulatory Visit: Payer: Self-pay | Admitting: Physician Assistant

## 2020-03-23 DIAGNOSIS — Z0000 Encounter for general adult medical examination without abnormal findings: Secondary | ICD-10-CM

## 2020-03-23 DIAGNOSIS — R928 Other abnormal and inconclusive findings on diagnostic imaging of breast: Secondary | ICD-10-CM

## 2020-03-23 DIAGNOSIS — J302 Other seasonal allergic rhinitis: Secondary | ICD-10-CM

## 2020-03-23 NOTE — Progress Notes (Signed)
Call patient- mammogram showed a possible mass in left breat. Imaging should be contacting you soon about more imaging.

## 2020-04-09 ENCOUNTER — Ambulatory Visit: Payer: Managed Care, Other (non HMO)

## 2020-04-11 ENCOUNTER — Other Ambulatory Visit: Payer: Self-pay

## 2020-04-11 ENCOUNTER — Ambulatory Visit (INDEPENDENT_AMBULATORY_CARE_PROVIDER_SITE_OTHER): Payer: Managed Care, Other (non HMO) | Admitting: Family Medicine

## 2020-04-11 VITALS — Temp 98.0°F

## 2020-04-11 DIAGNOSIS — Z23 Encounter for immunization: Secondary | ICD-10-CM

## 2020-04-11 NOTE — Progress Notes (Signed)
Patient presents to office for second Shingrix vaccine. Confirmed with patient no reactions to first injection. Patient denies any other vaccines in the last 2 weeks.   Vaccine administered in left deltoid. No immediate complications.   HM verified, patient states she does not wish to receive COVID vaccine at this time. I advised patient if she decides to get this, she needs to wait at least 2 weeks before and after any other immunizations

## 2020-04-12 ENCOUNTER — Encounter: Payer: Self-pay | Admitting: Family Medicine

## 2020-04-12 NOTE — Progress Notes (Signed)
Agree with documentation as above.   Turhan Chill, MD  

## 2020-04-20 ENCOUNTER — Other Ambulatory Visit: Payer: Managed Care, Other (non HMO)

## 2020-05-11 ENCOUNTER — Ambulatory Visit
Admission: RE | Admit: 2020-05-11 | Discharge: 2020-05-11 | Disposition: A | Discharge status: Home / Self Care | Payer: Managed Care, Other (non HMO) | Source: Ambulatory Visit | Attending: Physician Assistant | Admitting: Physician Assistant

## 2020-05-11 ENCOUNTER — Other Ambulatory Visit: Payer: Self-pay

## 2020-05-11 DIAGNOSIS — R928 Other abnormal and inconclusive findings on diagnostic imaging of breast: Secondary | ICD-10-CM

## 2020-05-15 ENCOUNTER — Encounter: Payer: Self-pay | Admitting: Physician Assistant

## 2020-05-15 DIAGNOSIS — N6012 Diffuse cystic mastopathy of left breast: Secondary | ICD-10-CM | POA: Insufficient documentation

## 2020-05-15 NOTE — Progress Notes (Signed)
GREAT news. Breast clinic should have discussed but no malignancy fibrocystic changes of left breast.

## 2021-01-07 ENCOUNTER — Other Ambulatory Visit: Payer: Self-pay

## 2021-01-07 ENCOUNTER — Emergency Department (INDEPENDENT_AMBULATORY_CARE_PROVIDER_SITE_OTHER): Payer: Managed Care, Other (non HMO)

## 2021-01-07 ENCOUNTER — Emergency Department
Admission: EM | Admit: 2021-01-07 | Discharge: 2021-01-07 | Disposition: A | Discharge status: Home / Self Care | Payer: Managed Care, Other (non HMO) | Source: Home / Self Care | Attending: Family Medicine | Admitting: Family Medicine

## 2021-01-07 ENCOUNTER — Encounter: Payer: Self-pay | Admitting: Emergency Medicine

## 2021-01-07 DIAGNOSIS — R053 Chronic cough: Secondary | ICD-10-CM

## 2021-01-07 DIAGNOSIS — R059 Cough, unspecified: Secondary | ICD-10-CM | POA: Diagnosis not present

## 2021-01-07 MED ORDER — METHYLPREDNISOLONE SODIUM SUCC 125 MG IJ SOLR
80.0000 mg | Freq: Once | INTRAMUSCULAR | Status: AC
  Administered 2021-01-07: 80 mg via INTRAMUSCULAR

## 2021-01-07 MED ORDER — PREDNISONE 20 MG PO TABS: ORAL_TABLET | ORAL | 0 refills | Status: DC

## 2021-01-07 NOTE — ED Triage Notes (Signed)
Patient here for cough congestion and fatigue about 4 weeks; no covid vaccinations nor influenza; husband currently does not feel well and he has not had vaccinations. Taking mucinex; took aleve 0700.

## 2021-01-07 NOTE — ED Provider Notes (Signed)
[name]    CSN: [id number] Arrival date & time: 01/07/21  1100      History   Chief Complaint Chief Complaint  Patient presents with  . Cough       . Nasal Congestion  . Fatigue    HPI Anna Robinson is a 50 y.o. female.   Patient developed URI symptoms about a month ago including cough, sinus congestion, sore throat, and fatigue.  She denies fevers, chills, and sweats.  She complains of persistent fatigue and cough, without pleuritic pain or shortness of breath.  Her husband has also been ill. Patient has not had COVID19 vaccinations.  The history is provided by the patient.    Past Medical History:  Diagnosis Date  . Allergy   . Anxiety   . Constipation   . GERD (gastroesophageal reflux disease)   . IBS (irritable bowel syndrome)   . PONV (postoperative nausea and vomiting)   . Seasonal allergies     Patient Active Problem List   Diagnosis Date Noted  . Fibrocystic changes of left breast 05/15/2020  . Class 1 obesity due to excess calories without serious comorbidity with body mass index (BMI) of 30.0 to 30.9 in adult 02/01/2020  . Right ovarian cyst 06/02/2019  . Elevated liver enzymes 02/14/2019  . Vitamin D insufficiency 01/26/2019  . Elevated fasting glucose 01/26/2019  . Dyslipidemia 01/26/2019  . Epigastric pain 02/15/2018  . No energy 01/15/2018  . Muscle cramps 03/02/2015    Past Surgical History:  Procedure Laterality Date  . ABDOMINAL HYSTERECTOMY    . COLONOSCOPY     ~10 yrs- normal per pt   . WISDOM TOOTH EXTRACTION     age 28-17    OB History    Gravida  3   Para  2   Term  2   Preterm      AB  1   Living        SAB      IAB  1   Ectopic      Multiple      Live Births               Home Medications    Prior to Admission medications   Medication Sig Start Date End Date Taking? Authorizing Provider  predniSONE (DELTASONE) 20 MG tablet Take one tab by mouth twice daily for 5 days, then one  daily for 4 days. Take with food. 01/07/21  Yes Lattie Haw, MD  diphenhydrAMINE (BENADRYL) 25 mg capsule Take 25 mg by mouth every 6 (six) hours as needed.    [provider]  MAGNESIUM PO Take by mouth. Takes once a week    [provider]  omeprazole (PRILOSEC) 40 MG capsule Take 1 capsule (40 mg total) by mouth daily. 06/02/19   Jomarie Longs, [name]  phentermine (ADIPEX-P) 37.5 MG tablet Take 1 tablet (37.5 mg total) by mouth daily before breakfast. 01/31/20   Jomarie Longs, [name]    Family History Family History  Problem Relation Age of Onset  . Heart disease Mother   . Diabetes Mother   . Hyperlipidemia Mother   . Hypertension Mother   . Stroke Mother   . Breast cancer Mother   . Cancer Father   . Brain cancer Father   . Lung cancer Father   . Anuerysm Father   . Depression Maternal Aunt   . Heart disease Maternal Grandmother   . Esophageal cancer Sister   .  Colon cancer Neg Hx   . Colon polyps Neg Hx   . Rectal cancer Neg Hx   . Stomach cancer Neg Hx     Social History Social History   Tobacco Use  . Smoking status: Never Smoker  . Smokeless tobacco: Never Used  Substance Use Topics  . Alcohol use: Yes    Alcohol/week: 0.0 standard drinks    Comment: rare - not often per pt   . Drug use: No     Allergies   Penicillins   Review of Systems Review of Systems  + sore throat + cough No pleuritic pain No wheezing + nasal congestion + post-nasal drainage No sinus pain/pressure No itchy/red eyes No earache No hemoptysis No SOB No fever/chills No nausea No vomiting No abdominal pain No diarrhea No urinary symptoms No skin rash + fatigue No myalgias No headache Used OTC meds (Benadryl) without relief    Physical Exam Triage Vital Signs ED Triage Vitals  Enc Vitals Group     BP 01/07/21 1219 108/74     Pulse Rate 01/07/21 1219 (!) 16     Resp 01/07/21 1219 16     Temp 01/07/21 1219 98 F (36.7 C)     Temp Source  01/07/21 1219 Oral     SpO2 01/07/21 1219 97 %     Weight 01/07/21 1220 195 lb (88.5 kg)     Height 01/07/21 1220 5\' 6"  (1.676 m)     Head Circumference --      Peak Flow --      Pain Score 01/07/21 1219 1     Pain Loc --      Pain Edu? --      Excl. in GC? --    No data found.  Updated Vital Signs BP 108/74 (BP Location: Right Arm)   Pulse (!) 16   Temp 98 F (36.7 C) (Oral)   Resp 16   Ht 5\' 6"  (1.676 m)   Wt 88.5 kg   SpO2 97%   BMI 31.47 kg/m   Visual Acuity Right Eye Distance:   Left Eye Distance:   Bilateral Distance:    Right Eye Near:   Left Eye Near:    Bilateral Near:     Physical Exam Nursing notes and Vital Signs reviewed. Appearance:  Patient appears stated age, and in no acute distress Eyes:  Pupils are equal, round, and reactive to light and accomodation.  Extraocular movement is intact.  Conjunctivae are not inflamed  Ears:  Canals normal.  Tympanic membranes normal.  Nose:  Mildly congested turbinates.  No sinus tenderness.  Pharynx:  Normal Neck:  Supple. No adenopathy. Lungs:  Clear to auscultation.  Breath sounds are equal.  Moving air well. Heart:  Regular rate and rhythm without murmurs, rubs, or gallops.  Abdomen:  Nontender without masses or hepatosplenomegaly.  Bowel sounds are present.  No CVA or flank tenderness.  Extremities:  No edema.  Skin:  No rash present.   UC Treatments / Results  Labs (all labs ordered are listed, but only abnormal results are displayed) Labs Reviewed  SARS-COV-2 RNA,(COVID-19) QUALITATIVE NAAT    EKG   Radiology DG Chest 2 View  Result Date: 01/07/2021 CLINICAL DATA:  Productive cough for 4 weeks. EXAM: CHEST - 2 VIEW COMPARISON:  None. FINDINGS: The cardiomediastinal silhouette is within normal limits. The lungs are well inflated and clear. There is no evidence of pleural effusion or pneumothorax. No acute osseous abnormality is identified. IMPRESSION: No  active cardiopulmonary disease. Electronically  Signed   By: Sebastian Ache M.D.   On: 01/07/2021 12:41    Procedures Procedures (including critical care time)  Medications Ordered in UC Medications  methylPREDNISolone sodium succinate (SOLU-MEDROL) 125 mg/2 mL injection 80 mg (has no administration in time range)    Initial Impression / Assessment and Plan / UC Course  I have reviewed the triage vital signs and the nursing notes.  Pertinent labs & imaging results that were available during my care of the patient were reviewed by me and considered in my medical decision making (see chart for details).    Suspect viral URI as the initial illness, now with post-infectious cough.  COVID19 PCR pending.  Administered Solumedrol 80mg  IM, then begin prednisone burst/taper. Followup with Family Doctor if not improved in one week.    Final Clinical Impressions(s) / UC Diagnoses   Final diagnoses:  Cough  Persistent cough for 3 weeks or longer     Discharge Instructions     Begin prednisone Tuesday 01/08/21. Take plain guaifenesin (1200mg  extended release tabs such as Mucinex) twice daily, with plenty of water, for cough and congestion.  May add Pseudoephedrine (30mg , one or two every 4 to 6 hours) for sinus congestion.  Get adequate rest.     ED Prescriptions    Medication Sig Dispense Auth. Provider   predniSONE (DELTASONE) 20 MG tablet Take one tab by mouth twice daily for 5 days, then one daily for 4 days. Take with food. 14 tablet 01/10/21, MD        , MD 01/09/21 1101

## 2021-01-07 NOTE — Discharge Instructions (Addendum)
Begin prednisone Tuesday 01/08/21. Take plain guaifenesin (1200mg  extended release tabs such as Mucinex) twice daily, with plenty of water, for cough and congestion.  May add Pseudoephedrine (30mg , one or two every 4 to 6 hours) for sinus congestion.  Get adequate rest.

## 2021-01-09 LAB — SARS-COV-2 RNA,(COVID-19) QUALITATIVE NAAT: SARS CoV2 RNA: DETECTED — CR

## 2021-02-19 ENCOUNTER — Encounter: Payer: Self-pay | Admitting: Physician Assistant

## 2021-02-19 ENCOUNTER — Other Ambulatory Visit: Payer: Self-pay | Admitting: Physician Assistant

## 2021-02-19 ENCOUNTER — Ambulatory Visit: Payer: Managed Care, Other (non HMO) | Admitting: Physician Assistant

## 2021-02-19 ENCOUNTER — Other Ambulatory Visit: Payer: Self-pay

## 2021-02-19 VITALS — BP 110/67 | HR 61 | Ht 66.0 in | Wt 198.0 lb

## 2021-02-19 DIAGNOSIS — Z6831 Body mass index (BMI) 31.0-31.9, adult: Secondary | ICD-10-CM

## 2021-02-19 DIAGNOSIS — E559 Vitamin D deficiency, unspecified: Secondary | ICD-10-CM | POA: Diagnosis not present

## 2021-02-19 DIAGNOSIS — K219 Gastro-esophageal reflux disease without esophagitis: Secondary | ICD-10-CM

## 2021-02-19 DIAGNOSIS — Z13220 Encounter for screening for lipoid disorders: Secondary | ICD-10-CM

## 2021-02-19 DIAGNOSIS — Z0000 Encounter for general adult medical examination without abnormal findings: Secondary | ICD-10-CM | POA: Diagnosis not present

## 2021-02-19 DIAGNOSIS — Z114 Encounter for screening for human immunodeficiency virus [HIV]: Secondary | ICD-10-CM

## 2021-02-19 DIAGNOSIS — Z1159 Encounter for screening for other viral diseases: Secondary | ICD-10-CM

## 2021-02-19 DIAGNOSIS — E6609 Other obesity due to excess calories: Secondary | ICD-10-CM

## 2021-02-19 DIAGNOSIS — Z1231 Encounter for screening mammogram for malignant neoplasm of breast: Secondary | ICD-10-CM

## 2021-02-19 DIAGNOSIS — R5383 Other fatigue: Secondary | ICD-10-CM

## 2021-02-19 DIAGNOSIS — Z131 Encounter for screening for diabetes mellitus: Secondary | ICD-10-CM | POA: Diagnosis not present

## 2021-02-19 DIAGNOSIS — Z1329 Encounter for screening for other suspected endocrine disorder: Secondary | ICD-10-CM

## 2021-02-19 MED ORDER — WEGOVY 0.25 MG/0.5ML ~~LOC~~ SOAJ: 0.2500 mg | SUBCUTANEOUS | 0 refills | Status: DC

## 2021-02-19 MED ORDER — OMEPRAZOLE 40 MG PO CPDR
40.0000 mg | DELAYED_RELEASE_CAPSULE | Freq: Every day | ORAL | 3 refills | Status: DC
Start: 2021-02-19 — End: 2022-02-24

## 2021-02-19 NOTE — Progress Notes (Signed)
Subjective:     Anna Robinson is a 50 y.o. female and is here for a comprehensive physical exam. The patient reports problems - continues to struggle with weight.  she gained it after covid and so tired all the time. She is active but not working out. She is making a lot of bad food choices.   Social History   Socioeconomic History  . Marital status: Married    Spouse name: Not on file  . Number of children: Not on file  . Years of education: Not on file  . Highest education level: Not on file  Occupational History  . Not on file  Tobacco Use  . Smoking status: Never Smoker  . Smokeless tobacco: Never Used  Substance and Sexual Activity  . Alcohol use: Yes    Alcohol/week: 0.0 standard drinks    Comment: rare - not often per pt   . Drug use: No  . Sexual activity: Yes    Partners: Female  Other Topics Concern  . Not on file  Social History Narrative  . Not on file   Social Determinants of Health   Financial Resource Strain: Not on file  Food Insecurity: Not on file  Transportation Needs: Not on file  Physical Activity: Not on file  Stress: Not on file  Social Connections: Not on file  Intimate Partner Violence: Not on file   Health Maintenance  Topic Date Due  . Hepatitis C Screening  Never done  . COVID-19 Vaccine (1) 03/07/2021 (Originally 11/06/1976)  . INFLUENZA VACCINE  03/14/2021 (Originally 07/15/2020)  . MAMMOGRAM  03/21/2021  . TETANUS/TDAP  02/27/2025  . COLONOSCOPY (Pts 45-48yrs Insurance coverage will need to be confirmed)  02/16/2030  . HIV Screening  Completed  . HPV VACCINES  Aged Out    The following portions of the patient's history were reviewed and updated as appropriate: allergies, current medications, past family history, past medical history, past social history, past surgical history and problem list.  Review of Systems A comprehensive review of systems was negative.   Objective:    BP 110/67   Pulse 61   Ht 5\' 6"  (1.676 m)   Wt  198 lb (89.8 kg)   SpO2 98%   BMI 31.96 kg/m  General appearance: alert, cooperative, appears stated age and mildly obese Head: Normocephalic, without obvious abnormality, atraumatic Eyes: conjunctivae/corneas clear. PERRL, EOM's intact. Fundi benign. Ears: normal TM's and external ear canals both ears Nose: Nares normal. Septum midline. Mucosa normal. No drainage or sinus tenderness. Throat: lips, mucosa, and tongue normal; teeth and gums normal Neck: no adenopathy, no carotid bruit, no JVD, supple, symmetrical, trachea midline and thyroid not enlarged, symmetric, no tenderness/mass/nodules Back: symmetric, no curvature. ROM normal. No CVA tenderness. Lungs: clear to auscultation bilaterally Heart: regular rate and rhythm, S1, S2 normal, no murmur, click, rub or gallop Abdomen: soft, non-tender; bowel sounds normal; no masses,  no organomegaly Extremities: extremities normal, atraumatic, no cyanosis or edema Pulses: 2+ and symmetric Skin: Skin color, texture, turgor normal. No rashes or lesions Lymph nodes: Cervical, supraclavicular, and axillary nodes normal. Neurologic: Alert and oriented X 3, normal strength and tone. Normal symmetric reflexes. Normal coordination and gait    Assessment:    Healthy female exam.      Plan:      Marland KitchenApril was seen today for annual exam.  Diagnoses and all orders for this visit:  Routine physical examination -     CBC with Differential/Platelet -  COMPLETE METABOLIC PANEL WITH GFR -     Lipid Panel w/reflex Direct LDL -     TSH -     Fe+TIBC+Fer -     Hepatitis C Antibody -     HIV antibody (with reflex)  Screening for diabetes mellitus -     COMPLETE METABOLIC PANEL WITH GFR  Screening for lipid disorders -     Lipid Panel w/reflex Direct LDL  Vitamin D insufficiency -     Vitamin D (25 hydroxy)  Thyroid disorder screen -     TSH  Fatigue, unspecified type -     Fe+TIBC+Fer  Encounter for hepatitis C screening test for low  risk patient -     Hepatitis C Antibody  Screening for HIV (human immunodeficiency virus) -     HIV antibody (with reflex)  Class 1 obesity due to excess calories without serious comorbidity with body mass index (BMI) of 31.0 to 31.9 in adult -     Semaglutide-Weight Management (WEGOVY) 0.25 MG/0.5ML SOAJ; Inject 0.25 mg into the skin once a week.  Gastroesophageal reflux disease without esophagitis -     omeprazole (PRILOSEC) 40 MG capsule; Take 1 capsule (40 mg total) by mouth daily.   .. Discussed 150 minutes of exercise a week.  Encouraged vitamin D 1000 units and Calcium 1300mg  or 4 servings of dairy a day.  Fasting labs ordered.  Hysterectomy.  Mammogram ordered.  Colonoscopy UTD.  Hep C/HIV screening ordered.  Shingles vaccine UTD.  Declined covid/flu shot.   Refilled omeprazole for GERD.   .Discussed low carb diet with 1500 calories and 80g of protein.  Exercising at least 150 minutes a week.  My Fitness Pal could be a [name].  Discussed wegovy. Discussed titration and side effects.  Follow up in 3 months.  See After Visit Summary for Counseling Recommendations

## 2021-02-19 NOTE — Patient Instructions (Addendum)
Wegovy injection  Semaglutide injection solution What is this medicine? SEMAGLUTIDE (Sem a GLOO tide) is used to improve blood sugar control in adults with type 2 diabetes. This medicine may be used with other diabetes medicines. This drug may also reduce the risk of heart attack or stroke if you have type 2 diabetes and risk factors for heart disease. This medicine may be used for other purposes; ask your health care provider or pharmacist if you have questions. COMMON BRAND NAME(S): OZEMPIC What should I tell my health care provider before I take this medicine? They need to know if you have any of these conditions:  endocrine tumors (MEN 2) or if someone in your family had these tumors  eye disease, vision problems  history of pancreatitis  kidney disease  stomach problems  thyroid cancer or if someone in your family had thyroid cancer  an unusual or allergic reaction to semaglutide, other medicines, foods, dyes, or preservatives  pregnant or trying to get pregnant  breast-feeding How should I use this medicine? This medicine is for injection under the skin of your upper leg (thigh), stomach area, or upper arm. It is given once every week (every 7 days). You will be taught how to prepare and give this medicine. Use exactly as directed. Take your medicine at regular intervals. Do not take it more often than directed. If you use this medicine with insulin, you should inject this medicine and the insulin separately. Do not mix them together. Do not give the injections right next to each other. Change (rotate) injection sites with each injection. It is important that you put your used needles and syringes in a special sharps container. Do not put them in a trash can. If you do not have a sharps container, call your pharmacist or healthcare provider to get one. A special MedGuide will be given to you by the pharmacist with each prescription and refill. Be sure to read this information  carefully each time. This drug comes with INSTRUCTIONS FOR USE. Ask your pharmacist for directions on how to use this drug. Read the information carefully. Talk to your pharmacist or health care provider if you have questions. Talk to your pediatrician regarding the use of this medicine in children. Special care may be needed. Overdosage: If you think you have taken too much of this medicine contact a poison control center or emergency room at once. NOTE: This medicine is only for you. Do not share this medicine with others. What if I miss a dose? If you miss a dose, take it as soon as you can within 5 days after the missed dose. Then take your next dose at your regular weekly time. If it has been longer than 5 days after the missed dose, do not take the missed dose. Take the next dose at your regular time. Do not take double or extra doses. If you have questions about a missed dose, contact your health care provider for advice. What may interact with this medicine?  other medicines for diabetes Many medications may cause changes in blood sugar, these include:  alcohol containing beverages  antiviral medicines for HIV or AIDS  aspirin and aspirin-like drugs  certain medicines for blood pressure, heart disease, irregular heart beat  chromium  diuretics  female hormones, such as estrogens or progestins, birth control pills  fenofibrate  gemfibrozil  isoniazid  lanreotide  female hormones or anabolic steroids  MAOIs like Carbex, Eldepryl, Marplan, Nardil, and Parnate  medicines for weight loss  medicines for allergies, asthma, cold, or cough  medicines for depression, anxiety, or psychotic disturbances  niacin  nicotine  NSAIDs, medicines for pain and inflammation, like ibuprofen or naproxen  octreotide  pasireotide  pentamidine  phenytoin  probenecid  quinolone antibiotics such as ciprofloxacin, levofloxacin, ofloxacin  some herbal dietary  supplements  steroid medicines such as prednisone or cortisone  sulfamethoxazole; trimethoprim  thyroid hormones Some medications can hide the warning symptoms of low blood sugar (hypoglycemia). You may need to monitor your blood sugar more closely if you are taking one of these medications. These include:  beta-blockers, often used for high blood pressure or heart problems (examples include atenolol, metoprolol, propranolol)  clonidine  guanethidine  reserpine This list may not describe all possible interactions. Give your health care provider a list of all the medicines, herbs, non-prescription drugs, or dietary supplements you use. Also tell them if you smoke, drink alcohol, or use illegal drugs. Some items may interact with your medicine. What should I watch for while using this medicine? Visit your doctor or health care professional for regular checks on your progress. Drink plenty of fluids while taking this medicine. Check with your doctor or health care professional if you get an attack of severe diarrhea, nausea, and vomiting. The loss of too much body fluid can make it dangerous for you to take this medicine. A test called the HbA1C (A1C) will be monitored. This is a simple blood test. It measures your blood sugar control over the last 2 to 3 months. You will receive this test every 3 to 6 months. Learn how to check your blood sugar. Learn the symptoms of low and high blood sugar and how to manage them. Always carry a quick-source of sugar with you in case you have symptoms of low blood sugar. Examples include hard sugar candy or glucose tablets. Make sure others know that you can choke if you eat or drink when you develop serious symptoms of low blood sugar, such as seizures or unconsciousness. They must get medical help at once. Tell your doctor or health care professional if you have high blood sugar. You might need to change the dose of your medicine. If you are sick or  exercising more than usual, you might need to change the dose of your medicine. Do not skip meals. Ask your doctor or health care professional if you should avoid alcohol. Many nonprescription cough and cold products contain sugar or alcohol. These can affect blood sugar. Pens should never be shared. Even if the needle is changed, sharing may result in passing of viruses like hepatitis or HIV. Wear a medical ID bracelet or chain, and carry a card that describes your disease and details of your medicine and dosage times. Do not become pregnant while taking this medicine. Women should inform their doctor if they wish to become pregnant or think they might be pregnant. There is a potential for serious side effects to an unborn child. Talk to your health care professional or pharmacist for more information. What side effects may I notice from receiving this medicine? Side effects that you should report to your doctor or health care professional as soon as possible:  allergic reactions like skin rash, itching or hives, swelling of the face, lips, or tongue  breathing problems  changes in vision  diarrhea that continues or is severe  lump or swelling on the neck  severe nausea  signs and symptoms of infection like fever or chills; cough; sore throat; pain  or trouble passing urine  signs and symptoms of low blood sugar such as feeling anxious, confusion, dizziness, increased hunger, unusually weak or tired, sweating, shakiness, cold, irritable, headache, blurred vision, fast heartbeat, loss of consciousness  signs and symptoms of kidney injury like trouble passing urine or change in the amount of urine  trouble swallowing  unusual stomach upset or pain  vomiting Side effects that usually do not require medical attention (report to your doctor or health care professional if they continue or are bothersome):  constipation  diarrhea  nausea  pain, redness, or irritation at site where  injected  stomach upset This list may not describe all possible side effects. Call your doctor for medical advice about side effects. You may report side effects to FDA at 1-800-FDA-1088. Where should I keep my medicine? Keep out of the reach of children. Store unopened pens in a refrigerator between 2 and 8 degrees C (36 and 46 degrees F). Do not freeze. Protect from light and heat. After you first use the pen, it can be stored for 56 days at room temperature between 15 and 30 degrees C (59 and 86 degrees F) or in a refrigerator. Throw away your used pen after 56 days or after the expiration date, whichever comes first. Do not store your pen with the needle attached. If the needle is left on, medicine may leak from the pen. NOTE: This sheet is a summary. It may not cover all possible information. If you have questions about this medicine, talk to your doctor, pharmacist, or health care provider.  2021 Elsevier/Gold Standard (2019-08-16 09:41:51)   Health Maintenance, Female Adopting a healthy lifestyle and getting preventive care are important in promoting health and wellness. Ask your health care provider about:  The right schedule for you to have regular tests and exams.  Things you can do on your own to prevent diseases and keep yourself healthy. What should I know about diet, weight, and exercise? Eat a healthy diet  Eat a diet that includes plenty of vegetables, fruits, low-fat dairy products, and lean protein.  Do not eat a lot of foods that are high in solid fats, added sugars, or sodium.   Maintain a healthy weight Body mass index (BMI) is used to identify weight problems. It estimates body fat based on height and weight. Your health care provider can help determine your BMI and help you achieve or maintain a healthy weight. Get regular exercise Get regular exercise. This is one of the most important things you can do for your health. Most adults should:  Exercise for at least  150 minutes each week. The exercise should increase your heart rate and make you sweat (moderate-intensity exercise).  Do strengthening exercises at least twice a week. This is in addition to the moderate-intensity exercise.  Spend less time sitting. Even light physical activity can be beneficial. Watch cholesterol and blood lipids Have your blood tested for lipids and cholesterol at 52 years of age, then have this test every 5 years. Have your cholesterol levels checked more often if:  Your lipid or cholesterol levels are high.  You are older than 52 years of age.  You are at high risk for heart disease. What should I know about cancer screening? Depending on your health history and family history, you may need to have cancer screening at various ages. This may include screening for:  Breast cancer.  Cervical cancer.  Colorectal cancer.  Skin cancer.  Lung cancer. What should I  know about heart disease, diabetes, and high blood pressure? Blood pressure and heart disease  High blood pressure causes heart disease and increases the risk of stroke. This is more likely to develop in people who have high blood pressure readings, are of African descent, or are overweight.  Have your blood pressure checked: ? Every 3-5 years if you are 7-22 years of age. ? Every year if you are 28 years old or older. Diabetes Have regular diabetes screenings. This checks your fasting blood sugar level. Have the screening done:  Once every three years after age 68 if you are at a normal weight and have a low risk for diabetes.  More often and at a younger age if you are overweight or have a high risk for diabetes. What should I know about preventing infection? Hepatitis B If you have a higher risk for hepatitis B, you should be screened for this virus. Talk with your health care provider to find out if you are at risk for hepatitis B infection. Hepatitis C Testing is recommended for:  Everyone  born from [birth year] through 1967.  Anyone with known risk factors for hepatitis C. Sexually transmitted infections (STIs)  Get screened for STIs, including gonorrhea and chlamydia, if: ? You are sexually active and are younger than 52 years of age. ? You are older than 52 years of age and your health care provider tells you that you are at risk for this type of infection. ? Your sexual activity has changed since you were last screened, and you are at increased risk for chlamydia or gonorrhea. Ask your health care provider if you are at risk.  Ask your health care provider about whether you are at high risk for HIV. Your health care provider may recommend a prescription medicine to help prevent HIV infection. If you choose to take medicine to prevent HIV, you should first get tested for HIV. You should then be tested every 3 months for as long as you are taking the medicine. Pregnancy  If you are about to stop having your period (premenopausal) and you may become pregnant, seek counseling before you get pregnant.  Take 400 to 800 micrograms (mcg) of folic acid every day if you become pregnant.  Ask for birth control (contraception) if you want to prevent pregnancy. Osteoporosis and menopause Osteoporosis is a disease in which the bones lose minerals and strength with aging. This can result in bone fractures. If you are 23 years old or older, or if you are at risk for osteoporosis and fractures, ask your health care provider if you should:  Be screened for bone loss.  Take a calcium or vitamin D supplement to lower your risk of fractures.  Be given hormone replacement therapy (HRT) to treat symptoms of menopause. Follow these instructions at home: Lifestyle  Do not use any products that contain nicotine or tobacco, such as cigarettes, e-cigarettes, and chewing tobacco. If you need help quitting, ask your health care provider.  Do not use street drugs.  Do not share needles.  Ask your  health care provider for help if you need support or information about quitting drugs. Alcohol use  Do not drink alcohol if: ? Your health care provider tells you not to drink. ? You are pregnant, may be pregnant, or are planning to become pregnant.  If you drink alcohol: ? Limit how much you use to 0-1 drink a day. ? Limit intake if you are breastfeeding.  Be aware of how much  alcohol is in your drink. In the [place] one drink equals one 12 oz bottle of beer (355 mL), one 5 oz glass of wine (148 mL), or one 1 oz glass of hard liquor (44 mL). General instructions  Schedule regular health, dental, and eye exams.  Stay current with your vaccines.  Tell your health care provider if: ? You often feel depressed. ? You have ever been abused or do not feel safe at home. Summary  Adopting a healthy lifestyle and getting preventive care are important in promoting health and wellness.  Follow your health care provider's instructions about healthy diet, exercising, and getting tested or screened for diseases.  Follow your health care provider's instructions on monitoring your cholesterol and blood pressure. This information is not intended to replace advice given to you by your health care provider. Make sure you discuss any questions you have with your health care provider. Document Revised: 11/24/2018 Document Reviewed: 11/24/2018 Elsevier Patient Education  2021 [employer].

## 2021-02-20 LAB — CBC WITH DIFFERENTIAL/PLATELET
Absolute Monocytes: 462 cells/uL (ref 200–950)
Basophils Absolute: 21 cells/uL (ref 0–200)
Basophils Relative: 0.3 %
Eosinophils Absolute: 138 cells/uL (ref 15–500)
Eosinophils Relative: 2 %
HCT: 43.1 % (ref 35.0–45.0)
Hemoglobin: 14.5 g/dL (ref 11.7–15.5)
Lymphs Abs: 2215 cells/uL (ref 850–3900)
MCH: 29.8 pg (ref 27.0–33.0)
MCHC: 33.6 g/dL (ref 32.0–36.0)
MCV: 88.5 fL (ref 80.0–100.0)
MPV: 10 fL (ref 7.5–12.5)
Monocytes Relative: 6.7 %
Neutro Abs: 4064 cells/uL (ref 1500–7800)
Neutrophils Relative %: 58.9 %
Platelets: 204 10*3/uL (ref 140–400)
RBC: 4.87 10*6/uL (ref 3.80–5.10)
RDW: 13.9 % (ref 11.0–15.0)
Total Lymphocyte: 32.1 %
WBC: 6.9 10*3/uL (ref 3.8–10.8)

## 2021-02-20 LAB — LIPID PANEL W/REFLEX DIRECT LDL
Cholesterol: 161 mg/dL (ref <200)
HDL: 36 mg/dL — ABNORMAL LOW (ref >=50)
LDL Cholesterol (Calc): 98 mg/dL (calc)
Non-HDL Cholesterol (Calc): 125 mg/dL (calc) (ref <130)
Total CHOL/HDL Ratio: 4.5 (calc) (ref <5.0)
Triglycerides: 177 mg/dL — ABNORMAL HIGH (ref <150)

## 2021-02-20 LAB — COMPLETE METABOLIC PANEL WITH GFR
AG Ratio: 2.6 (calc) — ABNORMAL HIGH (ref 1.0–2.5)
ALT: 31 U/L — ABNORMAL HIGH (ref 6–29)
AST: 23 U/L (ref 10–35)
Albumin: 4.1 g/dL (ref 3.6–5.1)
Alkaline phosphatase (APISO): 60 U/L (ref 37–153)
BUN: 10 mg/dL (ref 7–25)
CO2: 27 mmol/L (ref 20–32)
Calcium: 8.9 mg/dL (ref 8.6–10.4)
Chloride: 107 mmol/L (ref 98–110)
Creat: 0.79 mg/dL (ref 0.50–1.05)
GFR, Est African American: 100 mL/min/{1.73_m2} (ref >=60)
GFR, Est Non African American: 87 mL/min/{1.73_m2} (ref >=60)
Globulin: 1.6 g/dL (calc) — ABNORMAL LOW (ref 1.9–3.7)
Glucose, Bld: 93 mg/dL (ref 65–99)
Potassium: 4.5 mmol/L (ref 3.5–5.3)
Sodium: 141 mmol/L (ref 135–146)
Total Bilirubin: 1.2 mg/dL (ref 0.2–1.2)
Total Protein: 5.7 g/dL — ABNORMAL LOW (ref 6.1–8.1)

## 2021-02-20 LAB — TSH: TSH: 2.15 mIU/L

## 2021-02-20 LAB — HEPATITIS C ANTIBODY
Hepatitis C Ab: NONREACTIVE
SIGNAL TO CUT-OFF: 0 (ref <1.00)

## 2021-02-20 LAB — IRON,TIBC AND FERRITIN PANEL
%SAT: 27 % (calc) (ref 16–45)
Ferritin: 37 ng/mL (ref 16–232)
Iron: 84 ug/dL (ref 45–160)
TIBC: 310 mcg/dL (calc) (ref 250–450)

## 2021-02-20 LAB — VITAMIN D 25 HYDROXY (VIT D DEFICIENCY, FRACTURES): Vit D, 25-Hydroxy: 27 ng/mL — ABNORMAL LOW (ref 30–100)

## 2021-02-20 LAB — HIV ANTIBODY (ROUTINE TESTING W REFLEX): HIV 1&2 Ab, 4th Generation: NONREACTIVE

## 2021-02-20 NOTE — Progress Notes (Signed)
[place],  No anemia.  Vitamin D low. Start D3 2000 units daily.  Thyroid normal range.  HDL, good cholesterol, low. Increase exercise and good fats.  TG just a little elevated. Decrease sugars/processed food.  Kidney function great.  Your protein levels are low. Make sure you are eating enough and foods higher in protein.  ALT just a bit elevated.   Avoid tylenol/alcohol and increase protein in diet. Recheck cmp in 1-2 months.

## 2021-05-15 ENCOUNTER — Ambulatory Visit: Payer: Managed Care, Other (non HMO)

## 2021-05-30 ENCOUNTER — Telehealth: Payer: Self-pay

## 2021-05-30 NOTE — Telephone Encounter (Signed)
Transition Care Management Unsuccessful Follow-up Telephone Call  Date of discharge and from where:  05/29/2021 from [facility]  Attempts:  1st Attempt  Reason for unsuccessful TCM follow-up call:  Left voice message

## 2021-05-31 NOTE — Telephone Encounter (Signed)
Transition Care Management Unsuccessful Follow-up Telephone Call  Date of discharge and from where:  05/29/2021 - [facility]  Attempts:  2nd Attempt  Reason for unsuccessful TCM follow-up call:  Left voice message

## 2021-06-03 NOTE — Telephone Encounter (Signed)
Transition Care Management Unsuccessful Follow-up Telephone Call  Date of discharge and from where: 05/29/21 from [facility]  Attempts:  3rd Attempt  Reason for unsuccessful TCM follow-up call:  Unable to reach patient

## 2021-06-12 ENCOUNTER — Ambulatory Visit: Payer: Managed Care, Other (non HMO)

## 2021-07-10 ENCOUNTER — Ambulatory Visit: Payer: Managed Care, Other (non HMO)

## 2022-01-02 ENCOUNTER — Other Ambulatory Visit: Payer: Self-pay

## 2022-01-02 ENCOUNTER — Emergency Department
Admission: EM | Admit: 2022-01-02 | Discharge: 2022-01-02 | Disposition: A | Discharge status: Home / Self Care | Payer: Managed Care, Other (non HMO) | Source: Home / Self Care

## 2022-01-02 DIAGNOSIS — J0100 Acute maxillary sinusitis, unspecified: Secondary | ICD-10-CM | POA: Diagnosis not present

## 2022-01-02 DIAGNOSIS — J309 Allergic rhinitis, unspecified: Secondary | ICD-10-CM

## 2022-01-02 MED ORDER — PREDNISONE 20 MG PO TABS: ORAL_TABLET | ORAL | 0 refills | Status: DC

## 2022-01-02 MED ORDER — SULFAMETHOXAZOLE-TRIMETHOPRIM 800-160 MG PO TABS: 1.0000 | ORAL_TABLET | Freq: Two times a day (BID) | ORAL | 0 refills | Status: AC

## 2022-01-02 MED ORDER — FEXOFENADINE HCL 180 MG PO TABS: 180.0000 mg | ORAL_TABLET | Freq: Every day | ORAL | 0 refills | Status: DC

## 2022-01-02 NOTE — Discharge Instructions (Addendum)
Advised patient to take medication as directed with food to completion.  Advised patient to take prednisone and Allegra with first dose of Bactrim for the next 5 of 10 days.  Advised may use Allegra afterwards as needed for concurrent postnasal drainage/drip.  Encouraged patient to increase daily water intake while taking these medications.

## 2022-01-02 NOTE — ED Triage Notes (Signed)
Pt c/o sinus issues x 2 weeks. Ear pain starting in last couple days. Benedryl , sudafed and mucinex prn.

## 2022-01-02 NOTE — ED Provider Notes (Signed)
[facility]    CSN: [id number] Arrival date & time: 01/02/22  1355      History   Chief Complaint Chief Complaint  Patient presents with   Sinus issues    X 2 wks    HPI Anna Robinson is a 51 y.o. female.   HPI 51-year-old female presents with sinus nasal congestion for 2 weeks with bilateral ear pain beginning over the past few days.  PMH significant for fatigue and obesity.  Past Medical History:  Diagnosis Date   Allergy    Anxiety    Constipation    GERD (gastroesophageal reflux disease)    IBS (irritable bowel syndrome)    PONV (postoperative nausea and vomiting)    Seasonal allergies     Patient Active Problem List   Diagnosis Date Noted   Class 1 obesity due to excess calories without serious comorbidity with body mass index (BMI) of 31.0 to 31.9 in adult 02/19/2021   Fibrocystic changes of left breast 05/15/2020   Class 1 obesity due to excess calories without serious comorbidity with body mass index (BMI) of 30.0 to 30.9 in adult 02/01/2020   Right ovarian cyst 06/02/2019   Elevated liver enzymes 02/14/2019   Vitamin D insufficiency 01/26/2019   Elevated fasting glucose 01/26/2019   Dyslipidemia 01/26/2019   Epigastric pain 02/15/2018   Fatigue 01/15/2018   Muscle cramps 03/02/2015    Past Surgical History:  Procedure Laterality Date   ABDOMINAL HYSTERECTOMY     COLONOSCOPY     ~10 yrs- normal per pt    WISDOM TOOTH EXTRACTION     age [age]    OB History     Gravida  3   Para  2   Term  2   Preterm      AB  1   Living         SAB      IAB  1   Ectopic      Multiple      Live Births               Home Medications    Prior to Admission medications   Medication Sig Start Date End Date Taking? Authorizing Provider  fexofenadine (ALLEGRA ALLERGY) 180 MG tablet Take 1 tablet (180 mg total) by mouth daily for 15 days. 01/02/22 01/17/22 Yes Eliezer Lofts, FNP  predniSONE (DELTASONE) 20 MG tablet Take 3 tabs PO  daily x 5 days. 01/02/22  Yes Eliezer Lofts, FNP  sulfamethoxazole-trimethoprim (BACTRIM DS) 800-160 MG tablet Take 1 tablet by mouth 2 (two) times daily for 10 days. 01/02/22 01/12/22 Yes Eliezer Lofts, FNP  diphenhydrAMINE (BENADRYL) 25 mg capsule Take 25 mg by mouth every 6 (six) hours as needed.    [provider]  MAGNESIUM PO Take by mouth. Takes once a week    [provider]  omeprazole (PRILOSEC) 40 MG capsule Take 1 capsule (40 mg total) by mouth daily. 02/19/21   Breeback, Royetta Car, [name]  Semaglutide-Weight Management (WEGOVY) 0.25 MG/0.5ML SOAJ Inject 0.25 mg into the skin once a week. 02/19/21   Donella Stade, [name]    Family History Family History  Problem Relation Age of Onset   Heart disease Mother    Diabetes Mother    Hyperlipidemia Mother    Hypertension Mother    Stroke Mother    Breast cancer Mother    Cancer Father    Brain cancer Father    Lung cancer Father  Anuerysm Father    Depression Maternal Aunt    Heart disease Maternal Grandmother    Esophageal cancer Sister    Colon cancer Neg Hx    Colon polyps Neg Hx    Rectal cancer Neg Hx    Stomach cancer Neg Hx     Social History Social History   Tobacco Use   Smoking status: Never   Smokeless tobacco: Never  Substance Use Topics   Alcohol use: Yes    Alcohol/week: 0.0 standard drinks    Comment: rare - not often per pt    Drug use: No     Allergies   Penicillins   Review of Systems Review of Systems  HENT:  Positive for congestion and ear pain.     Physical Exam Triage Vital Signs ED Triage Vitals  Enc Vitals Group     BP 01/02/22 1456 112/77     Pulse Rate 01/02/22 1456 64     Resp 01/02/22 1456 18     Temp 01/02/22 1456 98.1 F (36.7 C)     Temp Source 01/02/22 1456 Oral     SpO2 01/02/22 1456 97 %     Weight --      Height --      Head Circumference --      Peak Flow --      Pain Score 01/02/22 1457 8     Pain Loc --      Pain Edu? --      Excl. in GC? --     No data found.  Updated Vital Signs BP 112/77 (BP Location: Right Arm)    Pulse 64    Temp 98.1 F (36.7 C) (Oral)    Resp 18    SpO2 97%   Physical Exam Vitals and nursing note reviewed.  Constitutional:      General: She is not in acute distress.    Appearance: Normal appearance. She is obese. She is diaphoretic. She is not ill-appearing.  HENT:     Head: Normocephalic and atraumatic.     Right Ear: Tympanic membrane and external ear normal.     Left Ear: Tympanic membrane and external ear normal.     Ears:     Comments: Moderate eustachian tube dysfunction noted bilaterally    Mouth/Throat:     Mouth: Mucous membranes are moist.     Pharynx: Oropharynx is clear.     Comments: Moderate amount of clear drainage of posterior oropharynx noted Eyes:     Extraocular Movements: Extraocular movements intact.     Conjunctiva/sclera: Conjunctivae normal.     Pupils: Pupils are equal, round, and reactive to light.  Cardiovascular:     Rate and Rhythm: Normal rate and regular rhythm.     Pulses: Normal pulses.     Heart sounds: Normal heart sounds.  Pulmonary:     Effort: Pulmonary effort is normal.     Breath sounds: Normal breath sounds.  Musculoskeletal:     Cervical back: Normal range of motion. Rigidity present.  Skin:    General: Skin is warm.  Neurological:     General: No focal deficit present.     Mental Status: She is alert and oriented to person, place, and time.     UC Treatments / Results  Labs (all labs ordered are listed, but only abnormal results are displayed) Labs Reviewed - No data to display  EKG   Radiology No results found.  Procedures Procedures (including critical care time)  Medications Ordered in UC Medications - No data to display  Initial Impression / Assessment and Plan / UC Course  I have reviewed the triage vital signs and the nursing notes.  Pertinent labs & imaging results that were available during my care of the patient were  reviewed by me and considered in my medical decision making (see chart for details).     MDM: 1.  Acute maxillary sinusitis, recurrence not specified-Rx'd Bactrim and Prednisone; 2.  Allergic rhinitis-Rx'd Allegra. Advised patient to take medication as directed with food to completion.  Advised patient to take prednisone and Allegra with first dose of Bactrim for the next 5 of 10 days.  Advised may use Allegra afterwards as needed for concurrent postnasal drainage/drip.  Encouraged patient to increase daily water intake while taking these medications.  Patient discharged home, hemodynamically stable. Final Clinical Impressions(s) / UC Diagnoses   Final diagnoses:  Acute maxillary sinusitis, recurrence not specified  Allergic rhinitis, unspecified seasonality, unspecified trigger     Discharge Instructions      Advised patient to take medication as directed with food to completion.  Advised patient to take prednisone and Allegra with first dose of Bactrim for the next 5 of 10 days.  Advised may use Allegra afterwards as needed for concurrent postnasal drainage/drip.  Encouraged patient to increase daily water intake while taking these medications.     ED Prescriptions     Medication Sig Dispense Auth. Provider   sulfamethoxazole-trimethoprim (BACTRIM DS) 800-160 MG tablet Take 1 tablet by mouth 2 (two) times daily for 10 days. 20 tablet Eliezer Lofts, FNP   predniSONE (DELTASONE) 20 MG tablet Take 3 tabs PO daily x 5 days. 15 tablet Eliezer Lofts, FNP   fexofenadine [facility] ALLERGY) 180 MG tablet Take 1 tablet (180 mg total) by mouth daily for 15 days. 15 tablet Eliezer Lofts, FNP      PDMP not reviewed this encounter.   Eliezer Lofts, [employer] 01/02/22 1609

## 2022-02-24 ENCOUNTER — Other Ambulatory Visit: Payer: Self-pay

## 2022-02-24 ENCOUNTER — Encounter: Payer: Self-pay | Admitting: Physician Assistant

## 2022-02-24 ENCOUNTER — Ambulatory Visit (INDEPENDENT_AMBULATORY_CARE_PROVIDER_SITE_OTHER): Payer: Managed Care, Other (non HMO) | Admitting: Physician Assistant

## 2022-02-24 VITALS — BP 114/67 | HR 60 | Ht 66.0 in | Wt 183.0 lb

## 2022-02-24 DIAGNOSIS — I498 Other specified cardiac arrhythmias: Secondary | ICD-10-CM

## 2022-02-24 DIAGNOSIS — Z0000 Encounter for general adult medical examination without abnormal findings: Secondary | ICD-10-CM

## 2022-02-24 DIAGNOSIS — E559 Vitamin D deficiency, unspecified: Secondary | ICD-10-CM

## 2022-02-24 DIAGNOSIS — E663 Overweight: Secondary | ICD-10-CM

## 2022-02-24 DIAGNOSIS — K219 Gastro-esophageal reflux disease without esophagitis: Secondary | ICD-10-CM

## 2022-02-24 DIAGNOSIS — R001 Bradycardia, unspecified: Secondary | ICD-10-CM

## 2022-02-24 DIAGNOSIS — R079 Chest pain, unspecified: Secondary | ICD-10-CM | POA: Insufficient documentation

## 2022-02-24 DIAGNOSIS — E785 Hyperlipidemia, unspecified: Secondary | ICD-10-CM

## 2022-02-24 DIAGNOSIS — Z1231 Encounter for screening mammogram for malignant neoplasm of breast: Secondary | ICD-10-CM

## 2022-02-24 DIAGNOSIS — Z1329 Encounter for screening for other suspected endocrine disorder: Secondary | ICD-10-CM

## 2022-02-24 DIAGNOSIS — Z13220 Encounter for screening for lipoid disorders: Secondary | ICD-10-CM

## 2022-02-24 DIAGNOSIS — Z131 Encounter for screening for diabetes mellitus: Secondary | ICD-10-CM

## 2022-02-24 MED ORDER — OMEPRAZOLE 40 MG PO CPDR: 40.0000 mg | DELAYED_RELEASE_CAPSULE | Freq: Every day | ORAL | 3 refills | Status: DC

## 2022-02-24 MED ORDER — BUPROPION HCL ER (SR) 150 MG PO TB12: 150.0000 mg | ORAL_TABLET | Freq: Two times a day (BID) | ORAL | 2 refills | Status: DC

## 2022-02-24 NOTE — Progress Notes (Signed)
Subjective:  ?  ? Anna Robinson is a 51 y.o. female and is here for a comprehensive physical exam. The patient reports problems - would like to lose 10lbs and needs help.wegovy was too expensive.  She has had some deaths in family and more anxious recently.  Her sister was drug overdose and niece was homicide.  ? ? ?Social History  ? ?Socioeconomic History  ? Marital status: Married  ?  Spouse name: Not on file  ? Number of children: Not on file  ? Years of education: Not on file  ? Highest education level: Not on file  ?Occupational History  ? Not on file  ?Tobacco Use  ? Smoking status: Never  ? Smokeless tobacco: Never  ?Substance and Sexual Activity  ? Alcohol use: Yes  ?  Alcohol/week: 0.0 standard drinks  ?  Comment: rare - not often per pt   ? Drug use: No  ? Sexual activity: Yes  ?  Partners: Female  ?Other Topics Concern  ? Not on file  ?Social History Narrative  ? Not on file  ? ?Social Determinants of Health  ? ?Financial Resource Strain: Not on file  ?Food Insecurity: Not on file  ?Transportation Needs: Not on file  ?Physical Activity: Not on file  ?Stress: Not on file  ?Social Connections: Not on file  ?Intimate Partner Violence: Not on file  ? ?Health Maintenance  ?Topic Date Due  ? MAMMOGRAM  03/21/2021  ? TETANUS/TDAP  02/27/2025  ? COLONOSCOPY (Pts 45-59yrs Insurance coverage will need to be confirmed)  02/16/2030  ? INFLUENZA VACCINE  Completed  ? Hepatitis C Screening  Completed  ? HIV Screening  Completed  ? Zoster Vaccines- Shingrix  Completed  ? HPV VACCINES  Aged Out  ? COVID-19 Vaccine  Discontinued  ? ? ?The following portions of the patient's history were reviewed and updated as appropriate: allergies, current medications, past family history, past medical history, past social history, past surgical history, and problem list. ? ?Review of Systems ?A comprehensive review of systems was negative.  ? ?Objective:  ? ? BP 114/67   Pulse 60   Ht 5\' 6"  (1.676 m)   Wt 183 lb (83 kg)   SpO2  98%   BMI 29.54 kg/m?  ?General appearance: alert, cooperative, and appears stated age ?Head: Normocephalic, without obvious abnormality, atraumatic ?Eyes: conjunctivae/corneas clear. PERRL, EOM's intact. Fundi benign. ?Ears: normal TM's and external ear canals both ears ?Nose: Nares normal. Septum midline. Mucosa normal. No drainage or sinus tenderness. ?Throat: lips, mucosa, and tongue normal; teeth and gums normal ?Neck: no adenopathy, no carotid bruit, no JVD, supple, symmetrical, trachea midline, and thyroid not enlarged, symmetric, no tenderness/mass/nodules ?Back: symmetric, no curvature. ROM normal. No CVA tenderness. ?Lungs: clear to auscultation bilaterally ?Heart: regular rate and rhythm, S1, S2 normal, no murmur, click, rub or gallop ?Abdomen: soft, non-tender; bowel sounds normal; no masses,  no organomegaly ?Extremities: extremities normal, atraumatic, no cyanosis or edema ?Pulses: 2+ and symmetric ?Skin: Skin color, texture, turgor normal. No rashes or lesions ?Lymph nodes: Cervical, supraclavicular, and axillary nodes normal. ?Neurologic: Alert and oriented X 3, normal strength and tone. Normal symmetric reflexes. Normal coordination and gait  ? ?Depression screen [facility] 2/9 02/24/2022 02/19/2021 01/31/2020 01/24/2019 08/10/2018  ?Decreased Interest 2 2 1 2  0  ?Down, Depressed, Hopeless 2 1 1 1  0  ?PHQ - 2 Score 4 3 2 3  0  ?Altered sleeping 2 1 3 2 3   ?Tired, decreased energy 2  1 1 3  0  ?Change in appetite 0 1 2 1 1   ?Feeling bad or failure about yourself  0 0 1 0 0  ?Trouble concentrating 0 0 0 0 0  ?Moving slowly or fidgety/restless 0 0 0 0 0  ?Suicidal thoughts 0 0 0 0 0  ?PHQ-9 Score 8 6 9 9 4   ?Difficult doing work/chores Not difficult at all Not difficult at all Not difficult at all Somewhat difficult Not difficult at all  ? ?.. ?GAD 7 : Generalized Anxiety Score 02/24/2022 02/19/2021 01/31/2020 01/24/2019  ?Nervous, Anxious, on Edge 2 0 1 2  ?Control/stop worrying 2 0 1 1  ?Worry too much - different  things 2 0 1 1  ?Trouble relaxing 2 1 1 1   ?Restless 2 1 1 2   ?Easily annoyed or irritable 2 1 1 1   ?Afraid - awful might happen 2 1 1  0  ?Total GAD 7 Score 14 4 7 8   ?Anxiety Difficulty Not difficult at all Not difficult at all Not difficult at all Somewhat difficult  ? ? ? ?Assessment:  ? ? Healthy female exam.   ?  ?Plan:  ? ? 5/8/20222/18/2021April was seen today for annual exam. ? ?Diagnoses and all orders for this visit: ? ?Routine physical examination ?-     TSH ?-     Lipid Panel w/reflex Direct LDL ?-     COMPLETE METABOLIC PANEL WITH GFR ?-     CBC with Differential/Platelet ?-     Vitamin D (25 hydroxy) ? ?Screening for diabetes mellitus ?-     COMPLETE METABOLIC PANEL WITH GFR ? ?Screening for lipid disorders ?-     Lipid Panel w/reflex Direct LDL ? ?Vitamin D insufficiency ?-     Vitamin D (25 hydroxy) ? ?Thyroid disorder screen ?-     TSH ? ?Dyslipidemia ?-     Lipid Panel w/reflex Direct LDL ? ?Encounter for screening mammogram for malignant neoplasm of breast ?-     MM 3D SCREEN BREAST BILATERAL ? ?Gastroesophageal reflux disease without esophagitis ?-     omeprazole (PRILOSEC) 40 MG capsule; Take 1 capsule (40 mg total) by mouth daily. ? ?Overweight (BMI 25.0-29.9) ?-     buPROPion (WELLBUTRIN SR) 150 MG 12 hr tablet; Take 1 tablet (150 mg total) by mouth 2 (two) times daily. ? ?Chest pain, unspecified type ? ? ?.. ?Discussed 150 minutes of exercise a week.  ?Encouraged vitamin D 1000 units and Calcium 1300mg  or 4 servings of dairy a day.  ?Fasting labs ordered.  ?PHQ/GAD elevated but a lot of home stress. ?Declined medication or counseling. ?No need for pap.  ?Mammogram ordered.  ?Colonoscopy UTD.  ?Declined covid vaccine.  ?Shingrix and flu UTD.  ? ?GERD- refilled omeprazole.  ? ?EKG showed sinus arrhymia with bradycardia. No ST elevation or depression. CP likely due to anxiety/stress. Will get stress test test to fully evaluate.  ? ? .Discussed low carb diet with 1500 calories and 80g of protein.  ?Exercising  at least 150 minutes a week.  ?My Fitness Pal could be a .  ?Discussed wellbutrin. Sent to pharmacy.  ?Follow up in 3 months.  ? ? ?See After Visit Summary for Counseling Recommendations  ? ?

## 2022-02-24 NOTE — Patient Instructions (Signed)

## 2022-02-25 NOTE — Addendum Note (Signed)
Addended by: Chalmers Cater on: 02/25/2022 02:10 PM ? ? Modules accepted: Orders ? ?

## 2022-02-25 NOTE — Progress Notes (Signed)
HDL a little low but LDL looks great.  ?Normal hemoglobin. ?Normal thyroid. ?Vitamin D low normal. Make sure taking at least 1000units a day.  ?Your protein is low. Make sure you are eating meals with protein in them. If not consider adding a protein shake daily to help with this.  ?Fasting glucose a little elevated. Nurse will add A1C.

## 2022-02-28 LAB — CBC WITH DIFFERENTIAL/PLATELET
Absolute Monocytes: 518 cells/uL (ref 200–950)
Basophils Absolute: 37 cells/uL (ref 0–200)
Basophils Relative: 0.5 %
Eosinophils Absolute: 348 cells/uL (ref 15–500)
Eosinophils Relative: 4.7 %
HCT: 45.7 % — ABNORMAL HIGH (ref 35.0–45.0)
Hemoglobin: 15.2 g/dL (ref 11.7–15.5)
Lymphs Abs: 3138 cells/uL (ref 850–3900)
MCH: 28.7 pg (ref 27.0–33.0)
MCHC: 33.3 g/dL (ref 32.0–36.0)
MCV: 86.4 fL (ref 80.0–100.0)
MPV: 9.9 fL (ref 7.5–12.5)
Monocytes Relative: 7 %
Neutro Abs: 3360 cells/uL (ref 1500–7800)
Neutrophils Relative %: 45.4 %
Platelets: 242 10*3/uL (ref 140–400)
RBC: 5.29 10*6/uL — ABNORMAL HIGH (ref 3.80–5.10)
RDW: 13.7 % (ref 11.0–15.0)
Total Lymphocyte: 42.4 %
WBC: 7.4 10*3/uL (ref 3.8–10.8)

## 2022-02-28 LAB — COMPLETE METABOLIC PANEL WITH GFR
AG Ratio: 2.1 (calc) (ref 1.0–2.5)
ALT: 25 U/L (ref 6–29)
AST: 20 U/L (ref 10–35)
Albumin: 3.7 g/dL (ref 3.6–5.1)
Alkaline phosphatase (APISO): 63 U/L (ref 37–153)
BUN: 13 mg/dL (ref 7–25)
CO2: 29 mmol/L (ref 20–32)
Calcium: 8.7 mg/dL (ref 8.6–10.4)
Chloride: 108 mmol/L (ref 98–110)
Creat: 1.03 mg/dL (ref 0.50–1.03)
Globulin: 1.8 g/dL (calc) — ABNORMAL LOW (ref 1.9–3.7)
Glucose, Bld: 107 mg/dL — ABNORMAL HIGH (ref 65–99)
Potassium: 4.2 mmol/L (ref 3.5–5.3)
Sodium: 142 mmol/L (ref 135–146)
Total Bilirubin: 0.9 mg/dL (ref 0.2–1.2)
Total Protein: 5.5 g/dL — ABNORMAL LOW (ref 6.1–8.1)
eGFR: 65 mL/min/{1.73_m2} (ref >=60)

## 2022-02-28 LAB — LIPID PANEL W/REFLEX DIRECT LDL
Cholesterol: 145 mg/dL (ref <200)
HDL: 32 mg/dL — ABNORMAL LOW (ref >=50)
LDL Cholesterol (Calc): 89 mg/dL (calc)
Non-HDL Cholesterol (Calc): 113 mg/dL (calc) (ref <130)
Total CHOL/HDL Ratio: 4.5 (calc) (ref <5.0)
Triglycerides: 143 mg/dL (ref <150)

## 2022-02-28 LAB — VITAMIN D 25 HYDROXY (VIT D DEFICIENCY, FRACTURES): Vit D, 25-Hydroxy: 37 ng/mL (ref 30–100)

## 2022-02-28 LAB — TSH: TSH: 1.97 mIU/L

## 2022-02-28 LAB — HEMOGLOBIN A1C W/OUT EAG: Hgb A1c MFr Bld: 5.6 % of total Hgb (ref <5.7)

## 2022-02-28 NOTE — Progress Notes (Signed)
A1C is 5.6 and trending up towards pre-diabetes but still in normal range.

## 2022-03-03 ENCOUNTER — Telehealth: Payer: Self-pay | Admitting: Neurology

## 2022-03-03 NOTE — Telephone Encounter (Signed)
Patient will try one more day and call back if happens again.  ?

## 2022-03-03 NOTE — Telephone Encounter (Signed)
Patient left vm stating the last few days - 2 hours after taking Wellbutrin she has had nose bleeds. She is concerned this is related to medication after looking up possible side effects. Please advise.  ?

## 2022-03-05 ENCOUNTER — Emergency Department
Admission: EM | Admit: 2022-03-05 | Discharge: 2022-03-05 | Disposition: A | Discharge status: Home / Self Care | Payer: Managed Care, Other (non HMO) | Source: Home / Self Care | Attending: Family Medicine | Admitting: Family Medicine

## 2022-03-05 ENCOUNTER — Emergency Department (INDEPENDENT_AMBULATORY_CARE_PROVIDER_SITE_OTHER): Payer: Managed Care, Other (non HMO)

## 2022-03-05 ENCOUNTER — Other Ambulatory Visit: Payer: Self-pay

## 2022-03-05 DIAGNOSIS — R042 Hemoptysis: Secondary | ICD-10-CM | POA: Diagnosis not present

## 2022-03-05 DIAGNOSIS — R040 Epistaxis: Secondary | ICD-10-CM

## 2022-03-05 DIAGNOSIS — R079 Chest pain, unspecified: Secondary | ICD-10-CM | POA: Diagnosis not present

## 2022-03-05 MED ORDER — METHYLPREDNISOLONE ACETATE 80 MG/ML IJ SUSP
80.0000 mg | Freq: Once | INTRAMUSCULAR | Status: AC
  Administered 2022-03-05: 80 mg via INTRAMUSCULAR

## 2022-03-05 NOTE — ED Triage Notes (Signed)
Pt presents with CP, SOB, epistaxis, and cough that began on Sunday. Pt endorses her hands have also felt tingly.  ?

## 2022-03-05 NOTE — Discharge Instructions (Signed)
Your blood count result will be available tomorrow.  You can check your results on MyChart.  I will call you if it is abnormal ?Follow-up with your usual primary care provider, tomorrow will call her with a report regarding Wellbutrin.  You may need alternative medicine ?Continue with your Allegra-D.  Drink lots of fluids. ?You are given a shot of prednisone that should last a few days and help with the coughing and symptoms ?

## 2022-03-05 NOTE — ED Provider Notes (Signed)
[name]    CSN: [id number] Arrival date & time: 03/05/22  1721      History   Chief Complaint Chief Complaint  Patient presents with   Cough   Epistaxis   Chest Pain    HPI Anna Robinson is a 51 y.o. female.   HPI  Patient states that she was started on Wellbutrin by her primary care doctor.  2 hours after her first dose she had a nosebleed.  She went ahead and took the second pill later in the day.  She had a second nosebleed that evening.  She has continued to try and take this medication.  She had a terrible coughing spell on Sunday and had uncontrollable coughing.  This caused her to have chest wall pain.  It hurts with cough and hurts with deep breath.  She states that she still is having some nosebleeds.  She has coughed up some blood.  She has chest pain that hurts with deep breath and coughing.  She feels short of breath. Patient questions whether she should continue her Wellbutrin Patient does have environmental allergies and is taking Allegra-D  Past Medical History:  Diagnosis Date   Allergy    Anxiety    Constipation    GERD (gastroesophageal reflux disease)    IBS (irritable bowel syndrome)    PONV (postoperative nausea and vomiting)    Seasonal allergies     Patient Active Problem List   Diagnosis Date Noted   Chest pain 02/24/2022   Overweight (BMI 25.0-29.9) 02/24/2022   Gastroesophageal reflux disease without esophagitis 02/24/2022   Sinus arrhythmia seen on electrocardiogram 02/24/2022   Sinus bradycardia 02/24/2022   Class 1 obesity due to excess calories without serious comorbidity with body mass index (BMI) of 31.0 to 31.9 in adult 02/19/2021   Fibrocystic changes of left breast 05/15/2020   Class 1 obesity due to excess calories without serious comorbidity with body mass index (BMI) of 30.0 to 30.9 in adult 02/01/2020   Right ovarian cyst 06/02/2019   Elevated liver enzymes 02/14/2019   Vitamin D insufficiency 01/26/2019    Elevated fasting glucose 01/26/2019   Dyslipidemia 01/26/2019   Epigastric pain 02/15/2018   Fatigue 01/15/2018   Muscle cramps 03/02/2015    Past Surgical History:  Procedure Laterality Date   ABDOMINAL HYSTERECTOMY     COLONOSCOPY     ~10 yrs- normal per pt    WISDOM TOOTH EXTRACTION     age [age]    OB History     Gravida  3   Para  2   Term  2   Preterm      AB  1   Living         SAB      IAB  1   Ectopic      Multiple      Live Births               Home Medications    Prior to Admission medications   Medication Sig Start Date End Date Taking? Authorizing Provider  buPROPion (WELLBUTRIN SR) 150 MG 12 hr tablet Take 1 tablet (150 mg total) by mouth 2 (two) times daily. 02/24/22   Jomarie Longs, [name]  diphenhydrAMINE (BENADRYL) 25 mg capsule Take 25 mg by mouth every 6 (six) hours as needed.    [provider]  fexofenadine (ALLEGRA ALLERGY) 180 MG tablet Take 1 tablet (180 mg total) by mouth daily for 15 days. 01/02/22 01/17/22  Trevor Iha, FNP  MAGNESIUM PO Take by mouth. Takes once a week    [provider]  omeprazole (PRILOSEC) 40 MG capsule Take 1 capsule (40 mg total) by mouth daily. 02/24/22   Jomarie Longs, [name]    Family History Family History  Problem Relation Age of Onset   Heart disease Mother    Diabetes Mother    Hyperlipidemia Mother    Hypertension Mother    Stroke Mother    Breast cancer Mother    Cancer Father    Brain cancer Father    Lung cancer Father    Anuerysm Father    Depression Maternal Aunt    Heart disease Maternal Grandmother    Esophageal cancer Sister    Colon cancer Neg Hx    Colon polyps Neg Hx    Rectal cancer Neg Hx    Stomach cancer Neg Hx     Social History Social History   Tobacco Use   Smoking status: Never   Smokeless tobacco: Never  Substance Use Topics   Alcohol use: Yes    Alcohol/week: 0.0 standard drinks    Comment: rare - not often per pt    Drug use: No      Allergies   Penicillins   Review of Systems Review of Systems See HPI  Physical Exam Triage Vital Signs ED Triage Vitals  Enc Vitals Group     BP 03/05/22 1735 122/77     Pulse Rate 03/05/22 1735 78     Resp 03/05/22 1735 14     Temp 03/05/22 1735 98.2 F (36.8 C)     Temp Source 03/05/22 1735 Oral     SpO2 03/05/22 1735 100 %     Weight --      Height --      Head Circumference --      Peak Flow --      Pain Score 03/05/22 1737 8     Pain Loc --      Pain Edu? --      Excl. in GC? --    No data found.  Updated Vital Signs BP 122/77 (BP Location: Left Arm)   Pulse 78   Temp 98.2 F (36.8 C) (Oral)   Resp 14   SpO2 100%      Physical Exam Constitutional:      General: She is not in acute distress.    Appearance: Normal appearance. She is well-developed and normal weight.  HENT:     Head: Normocephalic and atraumatic.     Right Ear: Tympanic membrane and ear canal normal.     Left Ear: Tympanic membrane and ear canal normal.     Nose: Nose normal. No congestion.     Mouth/Throat:     Mouth: Mucous membranes are moist.     Pharynx: No posterior oropharyngeal erythema.  Eyes:     Conjunctiva/sclera: Conjunctivae normal.     Pupils: Pupils are equal, round, and reactive to light.  Cardiovascular:     Rate and Rhythm: Normal rate and regular rhythm.     Heart sounds: Normal heart sounds.  Pulmonary:     Effort: Pulmonary effort is normal. No respiratory distress.     Breath sounds: Normal breath sounds. No wheezing, rhonchi or rales.  Abdominal:     General: There is no distension.     Palpations: Abdomen is soft.  Musculoskeletal:        General: Normal range of motion.  Cervical back: Normal range of motion and neck supple.  Skin:    General: Skin is warm and dry.     Findings: No bruising.  Neurological:     General: No focal deficit present.     Mental Status: She is alert.  Psychiatric:        Mood and Affect: Mood normal.         Behavior: Behavior normal.     Comments: Appropriate concern     UC Treatments / Results  Labs (all labs ordered are listed, but only abnormal results are displayed) Labs Reviewed  CBC WITH DIFFERENTIAL/PLATELET    EKG   Radiology DG Chest 2 View  Result Date: 03/05/2022 CLINICAL DATA:  Coughing up blood. EXAM: CHEST - 2 VIEW COMPARISON:  Chest x-ray 01/07/2021. FINDINGS: The heart size and mediastinal contours are within normal limits. Both lungs are clear. The visualized skeletal structures are unremarkable. IMPRESSION: No active cardiopulmonary disease. Electronically Signed   By: Darliss Cheney M.D.   On: 03/05/2022 18:14    Procedures ED EKG  Date/Time: 03/05/2022 6:26 PM Performed by: Eustace Moore, MD Authorized by: Eustace Moore, MD   ECG reviewed by ED Physician in the absence of a cardiologist: yes   Previous ECG:    Previous ECG:  Compared to current   Similarity:  No change   Comparison ECG info:  Rate increased Interpretation:    Interpretation: normal   Rate:    ECG rate:  75   ECG rate assessment: normal   Ectopy:    Ectopy: none   QRS:    QRS axis:  Normal   QRS intervals:  Normal   QRS conduction: normal   ST segments:    ST segments:  Normal T waves:    T waves: normal   Q waves:    Abnormal Q-waves: not present   Comments:     NORMAL (including critical care time)  Medications Ordered in UC Medications  methylPREDNISolone acetate (DEPO-MEDROL) injection 80 mg (has no administration in time range)    Initial Impression / Assessment and Plan / UC Course  I have reviewed the triage vital signs and the nursing notes.  Pertinent labs & imaging results that were available during my care of the patient were reviewed by me and considered in my medical decision making (see chart for details).     Vital signs are stable.  EKG is normal.  Chest x-ray is normal.  I did look up the relationship between Wellbutrin and nosebleeds.  It is  quite an unusual reaction, but Wellbutrin has been found to cause thrombocytopenia and pancytopenia especially in older women.  I am going to order a CBC and have her follow-up with her primary care doctor.  Stop Wellbutrin Final Clinical Impressions(s) / UC Diagnoses   Final diagnoses:  Frequent nosebleeds     Discharge Instructions      Your blood count result will be available tomorrow.  You can check your results on MyChart.  I will call you if it is abnormal Follow-up with your usual primary care provider, tomorrow will call her with a report regarding Wellbutrin.  You may need alternative medicine Continue with your Allegra-D.  Drink lots of fluids. You are given a shot of prednisone that should last a few days and help with the coughing and symptoms     ED Prescriptions   None    PDMP not reviewed this encounter.   Eustace Moore, MD  03/05/22 1834 ° °

## 2022-03-06 LAB — CBC WITH DIFFERENTIAL/PLATELET
Absolute Monocytes: 757 cells/uL (ref 200–950)
Basophils Absolute: 57 cells/uL (ref 0–200)
Basophils Relative: 0.5 %
Eosinophils Absolute: 226 cells/uL (ref 15–500)
Eosinophils Relative: 2 %
HCT: 43.5 % (ref 35.0–45.0)
Hemoglobin: 14.7 g/dL (ref 11.7–15.5)
Lymphs Abs: 2170 cells/uL (ref 850–3900)
MCH: 29.1 pg (ref 27.0–33.0)
MCHC: 33.8 g/dL (ref 32.0–36.0)
MCV: 86.1 fL (ref 80.0–100.0)
MPV: 10.1 fL (ref 7.5–12.5)
Monocytes Relative: 6.7 %
Neutro Abs: 8091 cells/uL — ABNORMAL HIGH (ref 1500–7800)
Neutrophils Relative %: 71.6 %
Platelets: 249 10*3/uL (ref 140–400)
RBC: 5.05 10*6/uL (ref 3.80–5.10)
RDW: 13.5 % (ref 11.0–15.0)
Total Lymphocyte: 19.2 %
WBC: 11.3 10*3/uL — ABNORMAL HIGH (ref 3.8–10.8)

## 2022-03-06 NOTE — Telephone Encounter (Signed)
Patient called back and states she took Wellbutrin and had another nose bleed. She went to ED because she was also having some chest tightness.  ? ?She states she doesn't react well to depression medications and would like to just try a medication for weight that was discussed at the visit. Please advise.  ?

## 2022-03-10 ENCOUNTER — Encounter: Payer: Self-pay | Admitting: Physician Assistant

## 2022-03-10 MED ORDER — PHENTERMINE HCL 37.5 MG PO CAPS: 37.5000 mg | ORAL_CAPSULE | ORAL | 0 refills | Status: DC

## 2022-03-10 MED ORDER — AZITHROMYCIN 250 MG PO TABS: ORAL_TABLET | ORAL | 0 refills | Status: DC

## 2022-03-10 NOTE — Telephone Encounter (Signed)
Confirm she is talking about the once weekly injectable? I am not sure her BMI is high enough for that approval.  ?We could do short burst of phentermine but she cannot stay on this long term and can cause anxiety/irritability/increased heart rate

## 2022-03-10 NOTE — Addendum Note (Signed)
Addended by: Jomarie Longs on: 03/10/2022 11:38 AM ? ? Modules accepted: Orders ? ?

## 2022-03-10 NOTE — Telephone Encounter (Signed)
Patient made aware.

## 2022-03-10 NOTE — Telephone Encounter (Signed)
Sent zpak for infection. Sent phentermine for weight loss.

## 2022-03-10 NOTE — Telephone Encounter (Signed)
Her insurance does not cover the shot so she would like to try a short burst of phentermine.  ? ?At urgent care she had CBC done and her WBC count was up. She has also had some green mucous she has been coughing up since she was seen in urgent care 03/05/2022. She wants to know if she should be on an antibiotic? Does she need another visit?  ?

## 2022-03-18 ENCOUNTER — Other Ambulatory Visit: Payer: Self-pay | Admitting: Physician Assistant

## 2022-03-18 DIAGNOSIS — E663 Overweight: Secondary | ICD-10-CM

## 2022-04-03 ENCOUNTER — Ambulatory Visit (INDEPENDENT_AMBULATORY_CARE_PROVIDER_SITE_OTHER): Payer: Managed Care, Other (non HMO)

## 2022-04-03 DIAGNOSIS — Z1231 Encounter for screening mammogram for malignant neoplasm of breast: Secondary | ICD-10-CM | POA: Diagnosis not present

## 2022-04-04 NOTE — Progress Notes (Signed)
Normal mammogram. Follow up in 1 year.

## 2022-05-27 ENCOUNTER — Ambulatory Visit: Payer: Managed Care, Other (non HMO) | Admitting: Physician Assistant

## 2022-05-27 ENCOUNTER — Encounter: Payer: Self-pay | Admitting: Physician Assistant

## 2022-05-27 VITALS — BP 109/64 | HR 85 | Ht 66.0 in | Wt 172.0 lb

## 2022-05-27 DIAGNOSIS — F419 Anxiety disorder, unspecified: Secondary | ICD-10-CM | POA: Diagnosis not present

## 2022-05-27 DIAGNOSIS — T50905D Adverse effect of unspecified drugs, medicaments and biological substances, subsequent encounter: Secondary | ICD-10-CM | POA: Diagnosis not present

## 2022-05-27 DIAGNOSIS — E663 Overweight: Secondary | ICD-10-CM | POA: Diagnosis not present

## 2022-05-27 MED ORDER — ALPRAZOLAM 0.25 MG PO TABS: 0.2500 mg | ORAL_TABLET | Freq: Every evening | ORAL | 5 refills | Status: DC | PRN

## 2022-05-27 NOTE — Patient Instructions (Signed)
Take 1/2 tablet of phentermine to taper down for the last month Xanax as needed.

## 2022-05-27 NOTE — Progress Notes (Signed)
Established Patient Office Visit  Subjective   Patient ID: Anna Robinson [facility], female    DOB: 1971-11-12  Age: 51 y.o. MRN: [id number]  Chief Complaint  Patient presents with   Follow-up    HPI Pt is a 51 yo female who presents to the clinic to follow up on weight loss and chest pains.   CP have improved but still has from time to time in response to stress/anxiety. She would like xanax as needed to use. She has not done stress test. CP not assoicated with exertion and does not think she needs it.   Pt has done well on phentermine. She is down 11lbs. She is exercising more and eating smaller portions. Wellbutrin caused nosebleeds and had to stop again. .. Active Ambulatory Problems    Diagnosis Date Noted   Muscle cramps 03/02/2015   Fatigue 01/15/2018   Epigastric pain 02/15/2018   Vitamin D insufficiency 01/26/2019   Elevated fasting glucose 01/26/2019   Dyslipidemia 01/26/2019   Elevated liver enzymes 02/14/2019   Right ovarian cyst 06/02/2019   Class 1 obesity due to excess calories without serious comorbidity with body mass index (BMI) of 30.0 to 30.9 in adult 02/01/2020   Fibrocystic changes of left breast 05/15/2020   Class 1 obesity due to excess calories without serious comorbidity with body mass index (BMI) of 31.0 to 31.9 in adult 02/19/2021   Chest pain 02/24/2022   Overweight (BMI 25.0-29.9) 02/24/2022   Gastroesophageal reflux disease without esophagitis 02/24/2022   Sinus arrhythmia seen on electrocardiogram 02/24/2022   Sinus bradycardia 02/24/2022   Anxiety 05/30/2022   Resolved Ambulatory Problems    Diagnosis Date Noted   Right lower quadrant abdominal tenderness 03/02/2015   Diarrhea 02/15/2018   Dehydration, mild 02/15/2018   Past Medical History:  Diagnosis Date   Allergy    Constipation    GERD (gastroesophageal reflux disease)    IBS (irritable bowel syndrome)    PONV (postoperative nausea and vomiting)    Seasonal allergies      ROS    See HPI.  Objective:     BP 109/64   Pulse 85   Ht 5\' 6"  (1.676 m)   Wt 172 lb (78 kg)   SpO2 100%   BMI 27.76 kg/m  BP Readings from Last 3 Encounters:  05/27/22 109/64  03/05/22 122/77  02/24/22 114/67   Wt Readings from Last 3 Encounters:  05/27/22 172 lb (78 kg)  02/24/22 183 lb (83 kg)  02/19/21 198 lb (89.8 kg)   ..    05/27/2022    2:58 PM 02/24/2022    9:04 AM 02/19/2021    9:09 AM 01/31/2020    1:17 PM 01/24/2019    3:48 PM  Depression screen PHQ 2/9  Decreased Interest 0 2 2 1 2   Down, Depressed, Hopeless 1 2 1 1 1   PHQ - 2 Score 1 4 3 2 3   Altered sleeping 2 2 1 3 2   Tired, decreased energy 2 2 1 1 3   Change in appetite 1 0 1 2 1   Feeling bad or failure about yourself  0 0 0 1 0  Trouble concentrating 0 0 0 0 0  Moving slowly or fidgety/restless 0 0 0 0 0  Suicidal thoughts 0 0 0 0 0  PHQ-9 Score 6 8 6 9 9   Difficult doing work/chores Not difficult at all Not difficult at all Not difficult at all Not difficult at all Somewhat difficult   .[name]  05/27/2022    2:58 PM 02/24/2022    9:04 AM 02/19/2021    9:09 AM 01/31/2020    1:17 PM  GAD 7 : Generalized Anxiety Score  Nervous, Anxious, on Edge 1 2 0 1  Control/stop worrying 1 2 0 1  Worry too much - different things 1 2 0 1  Trouble relaxing 1 2 1 1   Restless 1 2 1 1   Easily annoyed or irritable 1 2 1 1   Afraid - awful might happen 1 2 1 1   Total GAD 7 Score 7 14 4 7   Anxiety Difficulty Not difficult at all Not difficult at all Not difficult at all Not difficult at all        Physical Exam Vitals reviewed.  Constitutional:      Appearance: Normal appearance.  Cardiovascular:     Rate and Rhythm: Normal rate and regular rhythm.  Pulmonary:     Effort: Pulmonary effort is normal.  Neurological:     General: No focal deficit present.     Mental Status: She is alert and oriented to person, place, and time.  Psychiatric:        Mood and Affect: Mood normal.          Assessment & Plan:   Marland KitchenMarland KitchenApril was seen today for follow-up.  Diagnoses and all orders for this visit:  Overweight (BMI 25.0-29.9)  Anxiety -     ALPRAZolam (XANAX) 0.25 MG tablet; Take 1 tablet (0.25 mg total) by mouth at bedtime as needed for anxiety.  Adverse effect of drug, subsequent encounter   .Marland KitchenDiscussed low carb diet with 1500 calories and 80g of protein.  Exercising at least 150 minutes a week.  [employer] could be a Chief Technology Officer.  Wellbutrin added to allergy list  ..PDMP reviewed during this encounter. Sent xanax as needed Declined daily SSRI or SSNRI therapy Follow up as needed  Tandy Gaw, [name]

## 2022-05-28 ENCOUNTER — Ambulatory Visit: Payer: Managed Care, Other (non HMO) | Admitting: Physician Assistant

## 2022-05-30 DIAGNOSIS — F419 Anxiety disorder, unspecified: Secondary | ICD-10-CM | POA: Insufficient documentation

## 2022-07-16 ENCOUNTER — Encounter: Payer: Self-pay | Admitting: Neurology

## 2022-09-29 ENCOUNTER — Ambulatory Visit
Admission: EM | Admit: 2022-09-29 | Discharge: 2022-09-29 | Disposition: A | Discharge status: Home / Self Care | Payer: Managed Care, Other (non HMO) | Attending: Family Medicine | Admitting: Family Medicine

## 2022-09-29 ENCOUNTER — Encounter: Payer: Self-pay | Admitting: Emergency Medicine

## 2022-09-29 ENCOUNTER — Ambulatory Visit: Payer: Managed Care, Other (non HMO)

## 2022-09-29 DIAGNOSIS — M62838 Other muscle spasm: Secondary | ICD-10-CM

## 2022-09-29 DIAGNOSIS — M5412 Radiculopathy, cervical region: Secondary | ICD-10-CM | POA: Diagnosis not present

## 2022-09-29 DIAGNOSIS — M542 Cervicalgia: Secondary | ICD-10-CM | POA: Diagnosis not present

## 2022-09-29 MED ORDER — PREDNISONE 10 MG (21) PO TBPK: ORAL_TABLET | Freq: Every day | ORAL | 0 refills | Status: DC

## 2022-09-29 MED ORDER — CELECOXIB 100 MG PO CAPS: 100.0000 mg | ORAL_CAPSULE | Freq: Two times a day (BID) | ORAL | 0 refills | Status: AC

## 2022-09-29 MED ORDER — METHOCARBAMOL 500 MG PO TABS: 500.0000 mg | ORAL_TABLET | Freq: Three times a day (TID) | ORAL | 0 refills | Status: DC | PRN

## 2022-09-29 NOTE — ED Triage Notes (Signed)
Patient c/o neck pain that radiates to her shoulders and down her back.  Patient remembers doing a lot cleaning prior to the pain.  Patient has been taken Tylenol, Ibuprofen and applying Aspercreme.

## 2022-09-29 NOTE — ED Provider Notes (Signed)
[facility]    CSN: [id number] Arrival date & time: 09/29/22  1119      History   Chief Complaint Chief Complaint  Patient presents with   Neck Pain    HPI Anna Robinson is a 51 y.o. female.   HPI Very pleasant 51 year old female presents with neck pain that radiates to her shoulders and down back.  Patient remembers doing a lot of housecleaning prior to pain.  Patient reports that her husband was incarcerated on 08/29/2022 and due to her anxiety she is spent a lot of time cleaning the home and cars that the family drives and is now developed worsening posterior bilateral neck pain.  Patient reports current neck pain comes from base of neck and radiates to shoulders and mid back patient has been taking OTC Tylenol, Ibuprofen and applying OTC Aspercreme.  PMH significant for IBS, anxiety, and obesity.  Past Medical History:  Diagnosis Date   Allergy    Anxiety    Constipation    GERD (gastroesophageal reflux disease)    IBS (irritable bowel syndrome)    PONV (postoperative nausea and vomiting)    Seasonal allergies     Patient Active Problem List   Diagnosis Date Noted   Anxiety 05/30/2022   Chest pain 02/24/2022   Overweight (BMI 25.0-29.9) 02/24/2022   Gastroesophageal reflux disease without esophagitis 02/24/2022   Sinus arrhythmia seen on electrocardiogram 02/24/2022   Sinus bradycardia 02/24/2022   Class 1 obesity due to excess calories without serious comorbidity with body mass index (BMI) of 31.0 to 31.9 in adult 02/19/2021   Fibrocystic changes of left breast 05/15/2020   Class 1 obesity due to excess calories without serious comorbidity with body mass index (BMI) of 30.0 to 30.9 in adult 02/01/2020   Right ovarian cyst 06/02/2019   Elevated liver enzymes 02/14/2019   Vitamin D insufficiency 01/26/2019   Elevated fasting glucose 01/26/2019   Dyslipidemia 01/26/2019   Epigastric pain 02/15/2018   Fatigue 01/15/2018   Muscle cramps 03/02/2015     Past Surgical History:  Procedure Laterality Date   COLONOSCOPY     ~10 yrs- normal per pt    TOTAL ABDOMINAL HYSTERECTOMY     WISDOM TOOTH EXTRACTION     age 47-17    OB History     Gravida  3   Para  2   Term  2   Preterm      AB  1   Living         SAB      IAB  1   Ectopic      Multiple      Live Births               Home Medications    Prior to Admission medications   Medication Sig Start Date End Date Taking? Authorizing Provider  ALPRAZolam (XANAX) 0.25 MG tablet Take 1 tablet (0.25 mg total) by mouth at bedtime as needed for anxiety. 05/27/22  Yes Breeback, Jade L, [name]  celecoxib (CELEBREX) 100 MG capsule Take 1 capsule (100 mg total) by mouth 2 (two) times daily for 15 days. 09/29/22 10/14/22 Yes Eliezer Lofts, FNP  diphenhydrAMINE (BENADRYL) 25 mg capsule Take 25 mg by mouth every 6 (six) hours as needed.   Yes [provider]  MAGNESIUM PO Take by mouth. Takes once a week   Yes [provider]  methocarbamol (ROBAXIN) 500 MG tablet Take 1 tablet (500 mg total) by mouth 3 (three)  times daily as needed. 09/29/22  Yes Trevor Iha, FNP  omeprazole (PRILOSEC) 40 MG capsule Take 1 capsule (40 mg total) by mouth daily. 02/24/22  Yes Breeback, Jade L, [name]  phentermine 37.5 MG capsule Take 1 capsule (37.5 mg total) by mouth every morning. 03/10/22  Yes Breeback, Jade L, [name]  predniSONE (STERAPRED UNI-PAK 21 TAB) 10 MG (21) TBPK tablet Take by mouth daily. Take 6 tabs by mouth daily  for 2 days, then 5 tabs for 2 days, then 4 tabs for 2 days, then 3 tabs for 2 days, 2 tabs for 2 days, then 1 tab by mouth daily for 2 days 09/29/22  Yes Trevor Iha, FNP  fexofenadine [facility] ALLERGY) 180 MG tablet Take 1 tablet (180 mg total) by mouth daily for 15 days. 01/02/22 05/27/22  Trevor Iha, FNP    Family History Family History  Problem Relation Age of Onset   Heart disease Mother    Diabetes Mother    Hyperlipidemia Mother     Hypertension Mother    Stroke Mother    Breast cancer Mother    Cancer Father    Brain cancer Father    Lung cancer Father    Anuerysm Father    Depression Maternal Aunt    Heart disease Maternal Grandmother    Esophageal cancer Sister    Colon cancer Neg Hx    Colon polyps Neg Hx    Rectal cancer Neg Hx    Stomach cancer Neg Hx     Social History Social History   Tobacco Use   Smoking status: Never   Smokeless tobacco: Never  Substance Use Topics   Alcohol use: Yes    Alcohol/week: 0.0 standard drinks of alcohol    Comment: rare - not often per pt    Drug use: No     Allergies   Penicillins and Wellbutrin [bupropion]   Review of Systems Review of Systems  Musculoskeletal:  Positive for neck pain.     Physical Exam Triage Vital Signs ED Triage Vitals  Enc Vitals Group     BP 09/29/22 1249 135/85     Pulse Rate 09/29/22 1249 67     Resp 09/29/22 1249 18     Temp 09/29/22 1249 97.9 F (36.6 C)     Temp Source 09/29/22 1249 Oral     SpO2 09/29/22 1249 98 %     Weight 09/29/22 1251 180 lb (81.6 kg)     Height 09/29/22 1251 5\' 6"  (1.676 m)     Head Circumference --      Peak Flow --      Pain Score 09/29/22 1251 10     Pain Loc --      Pain Edu? --      Excl. in GC? --    No data found.  Updated Vital Signs BP 135/85 (BP Location: Right Arm)   Pulse 67   Temp 97.9 F (36.6 C) (Oral)   Resp 18   Ht 5\' 6"  (1.676 m)   Wt 180 lb (81.6 kg)   SpO2 98%   BMI 29.05 kg/m      Physical Exam Vitals and nursing note reviewed.  Constitutional:      Appearance: Normal appearance. She is obese.  HENT:     Head: Normocephalic and atraumatic.     Mouth/Throat:     Mouth: Mucous membranes are moist.     Pharynx: Oropharynx is clear.  Eyes:     Extraocular Movements: Extraocular  movements intact.     Conjunctiva/sclera: Conjunctivae normal.     Pupils: Pupils are equal, round, and reactive to light.  Neck:     Comments: Limited range of motion with 4  planes of movement; moderate to significant bilateral trapezius muscle spasms with palpable muscle adhesions noted Cardiovascular:     Rate and Rhythm: Normal rate and regular rhythm.     Pulses: Normal pulses.     Heart sounds: Normal heart sounds.  Pulmonary:     Effort: Pulmonary effort is normal.     Breath sounds: Normal breath sounds. No wheezing, rhonchi or rales.  Musculoskeletal:        General: Normal range of motion.     Cervical back: Neck supple. No rigidity.  Skin:    General: Skin is warm and dry.  Neurological:     General: No focal deficit present.     Mental Status: She is alert and oriented to person, place, and time.      UC Treatments / Results  Labs (all labs ordered are listed, but only abnormal results are displayed) Labs Reviewed - No data to display  EKG   Radiology DG Cervical Spine Complete  Result Date: 09/29/2022 CLINICAL DATA:  Neck pain for 6 weeks EXAM: CERVICAL SPINE - COMPLETE 4+ VIEW COMPARISON:  None Available. FINDINGS: There is no evidence of cervical spine fracture or prevertebral soft tissue swelling. Alignment is normal. Intervertebral disc heights are preserved. Oblique views demonstrate mild bony foraminal crowding on the right at C3-4 secondary to mild facet and uncovertebral arthropathy. Remaining bony foramina bilaterally appear widely patent. No other significant bone abnormalities are identified. IMPRESSION: 1. No acute findings. 2. Mild bony foraminal crowding on the right at C3-4 secondary to mild facet and uncovertebral arthropathy. Electronically Signed   By: Duanne Guess D.O.   On: 09/29/2022 14:28    Procedures Procedures (including critical care time)  Medications Ordered in UC Medications - No data to display  Initial Impression / Assessment and Plan / UC Course  I have reviewed the triage vital signs and the nursing notes.  Pertinent labs & imaging results that were available during my care of the patient were  reviewed by me and considered in my medical decision making (see chart for details).     MDM: 1.  Cervical radiculopathy-Rx'd Sterapred Unipak; 2.  Neck pain-cervical spine x-ray results revealed a polyp; 3.  Trapezius muscle spasm-Rx'd Robaxin. Patient advised of cervical spine x-ray results with hard copy provided to patient.  Advised patient to take medication as directed with food to completion.  Advised patient to take Sterapred Unipak with first dose of Celebrex for the next 10 of 15 days.  Advised may use Robaxin daily or as needed for accompanying muscle spasms of trapezius groups.  Encouraged patient to increase daily water intake to 64 ounces per day while taking these medications.  Advised if symptoms worsen and/or unresolved please follow-up with PCP or here for further evaluation.  Work note provided to patient per request.  Patient discharged home, hemodynamically stable. Final Clinical Impressions(s) / UC Diagnoses   Final diagnoses:  Neck pain  Trapezius muscle spasm  Cervical radiculopathy     Discharge Instructions      Patient advised of cervical spine x-ray results with hard copy provided to patient.  Advised patient to take medication as directed with food to completion.  Advised patient to take Sterapred Unipak with first dose of Celebrex for the next 10 of 15  days.  Advised may use Robaxin daily or as needed for accompanying muscle spasms of trapezius groups.  Encouraged patient to increase daily water intake to 64 ounces per day while taking these medications.  Advised if symptoms worsen and/or unresolved please follow-up with PCP or here for further evaluation.     ED Prescriptions     Medication Sig Dispense Auth. Provider   predniSONE (STERAPRED UNI-PAK 21 TAB) 10 MG (21) TBPK tablet Take by mouth daily. Take 6 tabs by mouth daily  for 2 days, then 5 tabs for 2 days, then 4 tabs for 2 days, then 3 tabs for 2 days, 2 tabs for 2 days, then 1 tab by mouth daily for 2  days 42 tablet Trevor Iha, FNP   celecoxib (CELEBREX) 100 MG capsule Take 1 capsule (100 mg total) by mouth 2 (two) times daily for 15 days. 30 capsule Trevor Iha, FNP   methocarbamol (ROBAXIN) 500 MG tablet Take 1 tablet (500 mg total) by mouth 3 (three) times daily as needed. 30 tablet Trevor Iha, FNP      PDMP not reviewed this encounter.   Trevor Iha, FNP 09/29/22 1456

## 2022-09-29 NOTE — Discharge Instructions (Addendum)
Patient advised of cervical spine x-ray results with hard copy provided to patient.  Advised patient to take medication as directed with food to completion.  Advised patient to take Sterapred Unipak with first dose of Celebrex for the next 10 of 15 days.  Advised may use Robaxin daily or as needed for accompanying muscle spasms of trapezius groups.  Encouraged patient to increase daily water intake to 64 ounces per day while taking these medications.  Advised if symptoms worsen and/or unresolved please follow-up with PCP or here for further evaluation.

## 2022-11-26 ENCOUNTER — Encounter: Payer: Self-pay | Admitting: Physician Assistant

## 2022-11-26 ENCOUNTER — Ambulatory Visit: Payer: Managed Care, Other (non HMO) | Admitting: Physician Assistant

## 2022-11-26 VITALS — BP 124/67 | HR 82 | Temp 97.9°F | Ht 66.0 in | Wt 186.0 lb

## 2022-11-26 DIAGNOSIS — Z6831 Body mass index (BMI) 31.0-31.9, adult: Secondary | ICD-10-CM

## 2022-11-26 DIAGNOSIS — K5901 Slow transit constipation: Secondary | ICD-10-CM

## 2022-11-26 DIAGNOSIS — R1030 Lower abdominal pain, unspecified: Secondary | ICD-10-CM

## 2022-11-26 DIAGNOSIS — K1379 Other lesions of oral mucosa: Secondary | ICD-10-CM

## 2022-11-26 DIAGNOSIS — M545 Low back pain, unspecified: Secondary | ICD-10-CM | POA: Diagnosis not present

## 2022-11-26 DIAGNOSIS — F419 Anxiety disorder, unspecified: Secondary | ICD-10-CM

## 2022-11-26 DIAGNOSIS — E6609 Other obesity due to excess calories: Secondary | ICD-10-CM

## 2022-11-26 LAB — POCT URINALYSIS DIP (CLINITEK)
Bilirubin, UA: NEGATIVE
Blood, UA: NEGATIVE
Glucose, UA: NEGATIVE mg/dL
Ketones, POC UA: NEGATIVE mg/dL
Leukocytes, UA: NEGATIVE
Nitrite, UA: NEGATIVE
POC PROTEIN,UA: NEGATIVE
Spec Grav, UA: 1.01 (ref 1.010–1.025)
Urobilinogen, UA: 0.2 E.U./dL
pH, UA: 6.5 (ref 5.0–8.0)

## 2022-11-26 MED ORDER — LINACLOTIDE 290 MCG PO CAPS: 290.0000 ug | ORAL_CAPSULE | Freq: Every day | ORAL | 1 refills | Status: DC

## 2022-11-26 MED ORDER — ALPRAZOLAM 0.25 MG PO TABS: 0.2500 mg | ORAL_TABLET | Freq: Every evening | ORAL | 5 refills | Status: DC | PRN

## 2022-11-26 NOTE — Patient Instructions (Signed)
Saxenda Wegovy Zepbound  For weight loss

## 2022-11-26 NOTE — Progress Notes (Signed)
Established Patient Office Visit  Subjective   Patient ID: Anna Robinson, female    DOB: [birth year]  Age: 51 y.o. MRN: [id number]  Chief Complaint  Patient presents with   Follow-up    HPI Pt is a 51 yo obese female with anxiety, HLD who presents to the clinic for follow up and medication refills.   She needs xanax refills. She ends up taking about 1 a day. No concerns.   She is having some low back pain and lower abdominal cramping for the last few weeks. She feels like her low back is constantly hurting. Denies any radiation into legs, lower extremity strength changes. She also has ongoing constipation. Denies any melena or hematochezia. Intermittent mouth sore that comes and goes. She does have some acid reflux. Ongoing constipation problems.  .. Active Ambulatory Problems    Diagnosis Date Noted   Muscle cramps 03/02/2015   Fatigue 01/15/2018   Epigastric pain 02/15/2018   Vitamin D insufficiency 01/26/2019   Elevated fasting glucose 01/26/2019   Dyslipidemia 01/26/2019   Elevated liver enzymes 02/14/2019   Right ovarian cyst 06/02/2019   Class 1 obesity due to excess calories without serious comorbidity with body mass index (BMI) of 30.0 to 30.9 in adult 02/01/2020   Fibrocystic changes of left breast 05/15/2020   Class 1 obesity due to excess calories without serious comorbidity with body mass index (BMI) of 31.0 to 31.9 in adult 02/19/2021   Chest pain 02/24/2022   Overweight (BMI 25.0-29.9) 02/24/2022   Gastroesophageal reflux disease without esophagitis 02/24/2022   Sinus arrhythmia seen on electrocardiogram 02/24/2022   Sinus bradycardia 02/24/2022   Anxiety 05/30/2022   DDD (degenerative disc disease), 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/01/2022   DDD (degenerative disc disease), 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    Objective:     BP 124/67   Pulse 82   Temp 97.9 F (36.6 C) (Oral)   Ht 5\' 6"  (1.676 m)   Wt 186 lb (84.4 kg)   SpO2 99%   BMI 30.02 kg/m  BP Readings from Last 3 Encounters:  11/26/22 124/67  09/29/22 135/85  05/27/22 109/64   Wt Readings from Last 3 Encounters:  11/26/22 186 lb (84.4 kg)  09/29/22 180 lb (81.6 kg)  05/27/22 172 lb (78 kg)      Physical Exam Constitutional:      Appearance: Normal appearance.  HENT:     Head: Normocephalic.  Cardiovascular:     Rate and Rhythm: Normal rate and regular rhythm.  Pulmonary:     Effort: Pulmonary effort is normal.  Abdominal:     General: There is distension.     Palpations: There is no mass.     Tenderness: There is no abdominal tenderness. There is no right CVA tenderness, left CVA tenderness, guarding or rebound.     Hernia: No hernia is present.  Musculoskeletal:     Right lower  leg: No edema.     Left lower leg: No edema.     Comments: Negative SLR NROM at waist No tenderness over lumbar spine but some tenderness over paraspinal muscles of spine  Neurological:     General: No focal deficit present.     Mental Status: She is alert and oriented to person, place, and time.  Psychiatric:        Mood and Affect: Mood normal.      Results for orders placed or performed in visit on 11/26/22  Urine Culture   Specimen: Urine  Result Value Ref Range   MICRO NUMBER: [id number]    SPECIMEN QUALITY: Adequate    Sample Source URINE, CLEAN CATCH    STATUS: FINAL    Result:      Mixed genital flora isolated. These superficial bacteria are  not indicative of a urinary tract infection. No further organism identification is warranted on this specimen. If clinically indicated, recollect clean-catch, mid-stream urine and transfer  immediately to Urine Culture Transport Tube.   POCT URINALYSIS DIP (CLINITEK)  Result Value Ref Range   Color, UA yellow yellow   Clarity, UA clear clear   Glucose, UA negative negative mg/dL   Bilirubin, UA negative negative   Ketones, POC UA negative negative mg/dL   Spec Grav, UA 5.053 9.767 - 1.025   Blood, UA negative negative   pH, UA 6.5 5.0 - 8.0   POC PROTEIN,UA negative negative, trace   Urobilinogen, UA 0.2 0.2 or 1.0 E.U./dL   Nitrite, UA Negative Negative   Leukocytes, UA Negative Negative        Assessment & Plan:  Marland KitchenMarland KitchenApril was seen today for follow-up.  Diagnoses and all orders for this visit:  Lower abdominal pain -     POCT URINALYSIS DIP (CLINITEK) -     Urine Culture  Acute bilateral low back pain without sciatica -     POCT URINALYSIS DIP (CLINITEK) -     Urine Culture  Anxiety -     ALPRAZolam (XANAX) 0.25 MG tablet; Take 1 tablet (0.25 mg total) by mouth at bedtime as needed for anxiety.  Mouth sores  Slow transit constipation -     linaclotide (LINZESS) 290 MCG CAPS capsule; Take 1 capsule (290 mcg total) by mouth daily.  Class 1 obesity due to excess calories without serious comorbidity with body mass index (BMI) of 31.0 to 31.9 in adult   UA negative for blood/protein/leukocytes Will culture Likely pain is more from low back and MSK related Consider xrays if pain continues  Constipation to treat with linzess  Concerned about starting GLP-1 due to constipation as side effect  Xanax refilled for as needed  ? Mouth sores none on PE today watch association with food   Tandy Gaw, [name]

## 2022-11-26 NOTE — Progress Notes (Signed)
Please culture.

## 2022-11-27 ENCOUNTER — Telehealth: Payer: Self-pay | Admitting: Neurology

## 2022-11-27 NOTE — Telephone Encounter (Signed)
Patient lvm stating she didn't have constipation medication her insurance state they don't cover any of the weight loss options that were given.   Called patient back, let her know (via LVM) that Linzess was sent to the pharmacy yesterday at 3:30 pm and if insurance does not cover medications at all, not a lot we can do on our end. She is to call with questions.

## 2022-11-28 ENCOUNTER — Ambulatory Visit: Payer: Managed Care, Other (non HMO)

## 2022-11-28 ENCOUNTER — Other Ambulatory Visit: Payer: Self-pay | Admitting: Neurology

## 2022-11-28 DIAGNOSIS — M545 Low back pain, unspecified: Secondary | ICD-10-CM | POA: Diagnosis not present

## 2022-11-28 LAB — URINE CULTURE
MICRO NUMBER:: 14314950
SPECIMEN QUALITY:: ADEQUATE

## 2022-11-28 MED ORDER — PREDNISONE 50 MG PO TABS: 50.0000 mg | ORAL_TABLET | Freq: Every day | ORAL | 0 refills | Status: DC

## 2022-11-28 NOTE — Progress Notes (Signed)
Mixed normal flora. No indication of significant bacteria in urine culture.

## 2022-12-01 ENCOUNTER — Encounter: Payer: Self-pay | Admitting: Physician Assistant

## 2022-12-01 ENCOUNTER — Other Ambulatory Visit: Payer: Self-pay | Admitting: Physician Assistant

## 2022-12-01 DIAGNOSIS — K1379 Other lesions of oral mucosa: Secondary | ICD-10-CM | POA: Insufficient documentation

## 2022-12-01 DIAGNOSIS — M545 Low back pain, unspecified: Secondary | ICD-10-CM | POA: Insufficient documentation

## 2022-12-01 DIAGNOSIS — M5136 Other intervertebral disc degeneration, lumbar region: Secondary | ICD-10-CM | POA: Insufficient documentation

## 2022-12-01 DIAGNOSIS — M5134 Other intervertebral disc degeneration, thoracic region: Secondary | ICD-10-CM | POA: Insufficient documentation

## 2022-12-01 DIAGNOSIS — R1030 Lower abdominal pain, unspecified: Secondary | ICD-10-CM | POA: Insufficient documentation

## 2022-12-01 DIAGNOSIS — K5901 Slow transit constipation: Secondary | ICD-10-CM | POA: Insufficient documentation

## 2022-12-01 MED ORDER — MELOXICAM 15 MG PO TABS: 15.0000 mg | ORAL_TABLET | Freq: Every day | ORAL | 2 refills | Status: DC

## 2022-12-01 NOTE — Progress Notes (Signed)
More aging changes in the spine but moderate at L4 and L5 with bone spurs. When this flares it can send pain into back and even down your legs.You need to see Dr. Karie Schwalbe for management and treatment planing here in office. You should be done with prednisone. I will send mobic to start daily for inflammation and pain until you can see him.

## 2022-12-01 NOTE — Progress Notes (Signed)
Mild curvature of spine, scoliosis with some aging changes in the mid back.

## 2023-02-08 ENCOUNTER — Other Ambulatory Visit: Payer: Self-pay | Admitting: Physician Assistant

## 2023-02-08 DIAGNOSIS — K219 Gastro-esophageal reflux disease without esophagitis: Secondary | ICD-10-CM

## 2023-02-27 ENCOUNTER — Encounter: Payer: Managed Care, Other (non HMO) | Admitting: Physician Assistant

## 2023-03-30 ENCOUNTER — Ambulatory Visit (INDEPENDENT_AMBULATORY_CARE_PROVIDER_SITE_OTHER): Payer: Managed Care, Other (non HMO) | Admitting: Physician Assistant

## 2023-03-30 VITALS — BP 131/50 | HR 69 | Ht 66.0 in | Wt 184.0 lb

## 2023-03-30 DIAGNOSIS — Z780 Asymptomatic menopausal state: Secondary | ICD-10-CM | POA: Diagnosis not present

## 2023-03-30 DIAGNOSIS — Z13220 Encounter for screening for lipoid disorders: Secondary | ICD-10-CM | POA: Diagnosis not present

## 2023-03-30 DIAGNOSIS — Z1231 Encounter for screening mammogram for malignant neoplasm of breast: Secondary | ICD-10-CM

## 2023-03-30 DIAGNOSIS — Z1329 Encounter for screening for other suspected endocrine disorder: Secondary | ICD-10-CM

## 2023-03-30 DIAGNOSIS — K5901 Slow transit constipation: Secondary | ICD-10-CM

## 2023-03-30 DIAGNOSIS — Z0000 Encounter for general adult medical examination without abnormal findings: Secondary | ICD-10-CM

## 2023-03-30 DIAGNOSIS — K219 Gastro-esophageal reflux disease without esophagitis: Secondary | ICD-10-CM

## 2023-03-30 DIAGNOSIS — Z131 Encounter for screening for diabetes mellitus: Secondary | ICD-10-CM

## 2023-03-30 LAB — CBC WITH DIFFERENTIAL/PLATELET
Eosinophils Relative: 2.5 %
Hemoglobin: 14.3 g/dL (ref 11.7–15.5)
Monocytes Relative: 7 %
Neutro Abs: 2886 cells/uL (ref 1500–7800)
WBC: 6 10*3/uL (ref 3.8–10.8)

## 2023-03-30 LAB — VITAMIN D 25 HYDROXY (VIT D DEFICIENCY, FRACTURES): Vit D, 25-Hydroxy: 54 ng/mL (ref 30–100)

## 2023-03-30 MED ORDER — OMEPRAZOLE 40 MG PO CPDR
40.0000 mg | DELAYED_RELEASE_CAPSULE | Freq: Every day | ORAL | 3 refills | Status: DC
Start: 2023-03-30 — End: 2023-05-27

## 2023-03-30 MED ORDER — LINACLOTIDE 290 MCG PO CAPS
290.0000 ug | ORAL_CAPSULE | Freq: Every day | ORAL | 1 refills | Status: DC
Start: 2023-03-30 — End: 2023-05-27

## 2023-03-30 NOTE — Patient Instructions (Signed)

## 2023-03-30 NOTE — Progress Notes (Signed)
Complete physical exam  Patient: [facility]   DOB: 03-Aug-1971   52 y.o. Female  MRN: [id number]  Subjective:    Chief Complaint  Patient presents with   Annual Exam    Anna Robinson is a 52 y.o. female who presents today for a complete physical exam. She reports consuming a general diet.  Patient is very active and walks frequently.  She generally feels well. She reports sleeping well. She does not have additional problems to discuss today.    Most recent fall risk assessment:    11/26/2022    3:17 PM  Fall Risk   Falls in the past year? 0  Number falls in past yr: 0  Injury with Fall? 0  Risk for fall due to : No Fall Risks  Follow up Falls evaluation completed     Most recent depression screenings:    03/30/2023    8:36 AM 11/26/2022    3:17 PM  PHQ 2/9 Scores  PHQ - 2 Score 0 0    Vision:Within last year and Dental: No current dental problems and Receives regular dental care  Patient Active Problem List   Diagnosis Date Noted   Post-menopausal 04/10/2023   DDD (degenerative disc disease), lumbar 12/01/2022   DDD (degenerative disc disease), 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   Mouth sores 12/01/2022   Lower abdominal pain 12/01/2022   Acute bilateral low back pain without sciatica 12/01/2022   Slow transit constipation 12/01/2022   Anxiety 05/30/2022   Chest pain 02/24/2022   Overweight (BMI 25.0-29.9) 02/24/2022   Gastroesophageal reflux disease without esophagitis 02/24/2022   Sinus arrhythmia seen on electrocardiogram 02/24/2022   Sinus bradycardia 02/24/2022   Class 1 obesity due to excess calories without serious comorbidity with body mass index (BMI) of 31.0 to 31.9 in adult 02/19/2021   Fibrocystic changes of left breast 05/15/2020   Class 1 obesity due to excess calories without serious comorbidity with body mass index (BMI) of 30.0 to 30.9 in adult 02/01/2020   Right ovarian cyst 06/02/2019   Elevated liver enzymes 02/14/2019   Vitamin D  insufficiency 01/26/2019   Elevated fasting glucose 01/26/2019   Dyslipidemia 01/26/2019   Epigastric pain 02/15/2018   Fatigue 01/15/2018   Muscle cramps 03/02/2015   Past Medical History:  Diagnosis Date   Allergy    Anxiety    Constipation    GERD (gastroesophageal reflux disease)    IBS (irritable bowel syndrome)    PONV (postoperative nausea and vomiting)    Seasonal allergies    Family Status  Relation Name Status   Mother  Deceased   Father  Deceased   Mat Aunt  (Not Specified)   MGM  (Not Specified)   Sister half sister -paternal Deceased   Neg Hx  (Not Specified)   Family History  Problem Relation Age of Onset   Heart disease Mother    Diabetes Mother    Hyperlipidemia Mother    Hypertension Mother    Stroke Mother    Breast cancer Mother    Cancer Father    Brain cancer Father    Lung cancer Father    Anuerysm Father    Depression Maternal Aunt    Heart disease Maternal Grandmother    Esophageal cancer Sister    Colon cancer Neg Hx    Colon polyps Neg Hx    Rectal cancer Neg Hx    Stomach cancer Neg Hx    Allergies  Allergen Reactions   Penicillins Other (See  Comments) and Rash    Chest tightness   Wellbutrin [Bupropion]     nosebleeds      Patient Care Team: Nolene Ebbs as PCP - General (Family Medicine)   Outpatient Medications Prior to Visit  Medication Sig   diphenhydrAMINE (BENADRYL) 25 mg capsule Take 25 mg by mouth every 6 (six) hours as needed.   MAGNESIUM PO Take by mouth. Takes once a week   [DISCONTINUED] linaclotide (LINZESS) 290 MCG CAPS capsule Take 1 capsule (290 mcg total) by mouth daily.   [DISCONTINUED] omeprazole (PRILOSEC) 40 MG capsule Take 1 capsule (40 mg total) by mouth daily.   ALPRAZolam (XANAX) 0.25 MG tablet Take 1 tablet (0.25 mg total) by mouth at bedtime as needed for anxiety.   fexofenadine (ALLEGRA ALLERGY) 180 MG tablet Take 1 tablet (180 mg total) by mouth daily for 15 days.   [DISCONTINUED]  buPROPion (WELLBUTRIN SR) 150 MG 12 hr tablet Take 1 tablet by mouth 2 (two) times daily. (Patient not taking: Reported on 03/30/2023)   [DISCONTINUED] meloxicam (MOBIC) 15 MG tablet Take 1 tablet (15 mg total) by mouth daily. (Patient not taking: Reported on 03/30/2023)   [DISCONTINUED] predniSONE (DELTASONE) 50 MG tablet Take 1 tablet (50 mg total) by mouth daily. (Patient not taking: Reported on 03/30/2023)   No facility-administered medications prior to visit.    Review of Systems  All other systems reviewed and are negative.         Objective:     BP (!) 131/50   Pulse 69   Ht 5\' 6"  (1.676 m)   Wt 184 lb (83.5 kg)   SpO2 99%   BMI 29.70 kg/m  BP Readings from Last 3 Encounters:  03/30/23 (!) 131/50  11/26/22 124/67  09/29/22 135/85   Wt Readings from Last 3 Encounters:  03/30/23 184 lb (83.5 kg)  11/26/22 186 lb (84.4 kg)  09/29/22 180 lb (81.6 kg)      Physical Exam  BP (!) 131/50   Pulse 69   Ht 5\' 6"  (1.676 m)   Wt 184 lb (83.5 kg)   SpO2 99%   BMI 29.70 kg/m   General Appearance:    Alert, cooperative, no distress, appears stated age  Head:    Normocephalic, without obvious abnormality, atraumatic  Eyes:    PERRL, conjunctiva/corneas clear, EOM's intact, fundi    benign, both eyes  Ears:    Normal TM's and external ear canals, both ears  Nose:   Nares normal, septum midline, mucosa normal, no drainage    or sinus tenderness  Throat:   Lips, mucosa, and tongue normal; teeth and gums normal  Neck:   Supple, symmetrical, trachea midline, no adenopathy;    thyroid:  no enlargement/tenderness/nodules; no carotid   bruit or JVD  Back:     Symmetric, no curvature, ROM normal, no CVA tenderness  Lungs:     Clear to auscultation bilaterally, respirations unlabored  Chest Wall:    No tenderness or deformity   Heart:    Regular rate and rhythm, S1 and S2 normal, no murmur, rub   or gallop     Abdomen:     Soft, non-tender, bowel sounds active all four  quadrants,    no masses, no organomegaly        Extremities:   Extremities normal, atraumatic, no cyanosis or edema  Pulses:   2+ and symmetric all extremities  Skin:   Skin color, texture, turgor normal, no rashes or lesions  Lymph nodes:   Cervical, supraclavicular, and axillary nodes normal  Neurologic:   CNII-XII intact, normal strength, sensation and reflexes    throughout      Assessment & Plan:    Routine Health Maintenance and Physical Exam  Immunization History  Administered Date(s) Administered   Influenza, Quadrivalent, Recombinant, Inj, Pf 09/22/2019   Influenza,inj,Quad PF,6+ Mos 09/12/2021   Influenza-Unspecified 09/11/2017, 10/13/2018, 09/28/2021   Tdap 02/28/2015   Zoster Recombinat (Shingrix) 01/31/2020, 04/11/2020    Health Maintenance  Topic Date Due   MAMMOGRAM  04/04/2023   INFLUENZA VACCINE  07/16/2023   DTaP/Tdap/Td (2 - Td or Tdap) 02/27/2025   COLONOSCOPY (Pts 45-2yrs Insurance coverage will need to be confirmed)  02/16/2030   Hepatitis C Screening  Completed   HIV Screening  Completed   Zoster Vaccines- Shingrix  Completed   HPV VACCINES  Aged Out   COVID-19 Vaccine  Discontinued    Discussed health benefits of physical activity, and encouraged her to engage in regular exercise appropriate for her age and condition. [name].Kiyah was seen today for annual exam.  Diagnoses and all orders for this visit:  Routine physical examination -     TSH -     Lipid Panel w/reflex Direct LDL -     COMPLETE METABOLIC PANEL WITH GFR -     CBC with Differential/Platelet -     VITAMIN D 25 Hydroxy (Vit-D Deficiency, Fractures)  Slow transit constipation -     linaclotide (LINZESS) 290 MCG CAPS capsule; Take 1 capsule (290 mcg total) by mouth daily.  Post-menopausal -     VITAMIN D 25 Hydroxy (Vit-D Deficiency, Fractures)  Screening for lipid disorders -     Lipid Panel w/reflex Direct LDL  Screening for diabetes mellitus -     COMPLETE METABOLIC PANEL  WITH GFR  Thyroid disorder screen -     TSH  Gastroesophageal reflux disease without esophagitis -     omeprazole (PRILOSEC) 40 MG capsule; Take 1 capsule (40 mg total) by mouth daily.  Visit for screening mammogram -     MM 3D SCREENING MAMMOGRAM BILATERAL BREAST  .[name] Discussed 150 minutes of exercise a week.  Encouraged vitamin D 1000 units and Calcium 1300mg  or 4 servings of dairy a day.  PHQ no concerns Fasting labs ordered Omeprazole and linzess refilled Mammogram ordered Pap UTD Shingles/Tdap UTD     Return in about 1 year (around 03/29/2024).     Tandy Gaw, [name]

## 2023-03-31 LAB — LIPID PANEL W/REFLEX DIRECT LDL
Cholesterol: 168 mg/dL (ref <200)
HDL: 37 mg/dL — ABNORMAL LOW (ref >=50)
LDL Cholesterol (Calc): 101 mg/dL (calc) — ABNORMAL HIGH
Non-HDL Cholesterol (Calc): 131 mg/dL (calc) — ABNORMAL HIGH (ref <130)
Total CHOL/HDL Ratio: 4.5 (calc) (ref <5.0)
Triglycerides: 178 mg/dL — ABNORMAL HIGH (ref <150)

## 2023-03-31 LAB — CBC WITH DIFFERENTIAL/PLATELET
Absolute Monocytes: 420 cells/uL (ref 200–950)
Basophils Absolute: 30 cells/uL (ref 0–200)
Basophils Relative: 0.5 %
Eosinophils Absolute: 150 cells/uL (ref 15–500)
HCT: 43.4 % (ref 35.0–45.0)
Lymphs Abs: 2514 cells/uL (ref 850–3900)
MCH: 28.5 pg (ref 27.0–33.0)
MCHC: 32.9 g/dL (ref 32.0–36.0)
MCV: 86.6 fL (ref 80.0–100.0)
MPV: 9.7 fL (ref 7.5–12.5)
Neutrophils Relative %: 48.1 %
Platelets: 231 10*3/uL (ref 140–400)
RBC: 5.01 10*6/uL (ref 3.80–5.10)
RDW: 12.9 % (ref 11.0–15.0)
Total Lymphocyte: 41.9 %

## 2023-03-31 LAB — COMPLETE METABOLIC PANEL WITH GFR
AG Ratio: 1.8 (calc) (ref 1.0–2.5)
ALT: 39 U/L — ABNORMAL HIGH (ref 6–29)
AST: 24 U/L (ref 10–35)
Albumin: 4.6 g/dL (ref 3.6–5.1)
Alkaline phosphatase (APISO): 100 U/L (ref 37–153)
BUN: 17 mg/dL (ref 7–25)
CO2: 28 mmol/L (ref 20–32)
Calcium: 9.7 mg/dL (ref 8.6–10.4)
Chloride: 105 mmol/L (ref 98–110)
Creat: 0.86 mg/dL (ref 0.50–1.03)
Globulin: 2.5 g/dL (calc) (ref 1.9–3.7)
Glucose, Bld: 99 mg/dL (ref 65–99)
Potassium: 4.6 mmol/L (ref 3.5–5.3)
Sodium: 142 mmol/L (ref 135–146)
Total Bilirubin: 0.9 mg/dL (ref 0.2–1.2)
Total Protein: 7.1 g/dL (ref 6.1–8.1)
eGFR: 81 mL/min/{1.73_m2} (ref >=60)

## 2023-03-31 LAB — TSH: TSH: 1.84 mIU/L

## 2023-03-31 NOTE — Progress Notes (Signed)
Anna Robinson,   Vitamin D looks great. Stay on same dose.  WBC and hemoglobin look good.  Thyroid is perfect.  Kidney and glucose look good.  ALT up a little. Avoid any alcohol or tylenol products and recheck in 2 weeks.  HDL, good cholesterol, a little low.  TG a little high.  LDL up from 1 year ago but close to optimal level.  Overall 10 year risk is still low at 2.1 percent.  Continue to work on exercise and healthy balanced diet. Recheck in 1 year.   [name].The 10-year ASCVD risk score (Arnett DK, et al., 2019) is: 2.1%   Values used to calculate the score:     Age: 52 years     Sex: Female     Is Non-Hispanic African American: No     Diabetic: No     Tobacco smoker: No     Systolic Blood Pressure: 131 mmHg     Is BP treated: No     HDL Cholesterol: 37 mg/dL     Total Cholesterol: 168 mg/dL

## 2023-04-01 ENCOUNTER — Other Ambulatory Visit: Payer: Self-pay

## 2023-04-01 DIAGNOSIS — R7989 Other specified abnormal findings of blood chemistry: Secondary | ICD-10-CM

## 2023-04-10 ENCOUNTER — Encounter: Payer: Self-pay | Admitting: Physician Assistant

## 2023-04-10 DIAGNOSIS — Z780 Asymptomatic menopausal state: Secondary | ICD-10-CM | POA: Insufficient documentation

## 2023-04-13 ENCOUNTER — Encounter: Payer: Self-pay | Admitting: Family Medicine

## 2023-04-13 ENCOUNTER — Ambulatory Visit: Payer: Managed Care, Other (non HMO) | Admitting: Physician Assistant

## 2023-04-13 ENCOUNTER — Ambulatory Visit: Payer: Managed Care, Other (non HMO) | Admitting: Family Medicine

## 2023-04-13 VITALS — BP 108/73 | HR 76 | Temp 98.6°F | Ht 66.0 in | Wt 184.1 lb

## 2023-04-13 DIAGNOSIS — G43909 Migraine, unspecified, not intractable, without status migrainosus: Secondary | ICD-10-CM | POA: Diagnosis not present

## 2023-04-13 DIAGNOSIS — M62838 Other muscle spasm: Secondary | ICD-10-CM | POA: Diagnosis not present

## 2023-04-13 DIAGNOSIS — R110 Nausea: Secondary | ICD-10-CM | POA: Diagnosis not present

## 2023-04-13 MED ORDER — CYCLOBENZAPRINE HCL 5 MG PO TABS
5.0000 mg | ORAL_TABLET | Freq: Three times a day (TID) | ORAL | 0 refills | Status: DC | PRN
Start: 2023-04-13 — End: 2023-05-06

## 2023-04-13 MED ORDER — SUMATRIPTAN SUCCINATE 50 MG PO TABS
ORAL_TABLET | ORAL | 0 refills | Status: DC
Start: 2023-04-13 — End: 2023-04-20

## 2023-04-13 MED ORDER — ONDANSETRON HCL 4 MG PO TABS: 4.0000 mg | ORAL_TABLET | Freq: Three times a day (TID) | ORAL | 0 refills | Status: DC | PRN

## 2023-04-13 NOTE — Progress Notes (Signed)
Established patient visit   Patient: Anna Robinson   DOB: [birth year]   52 y.o. Female  MRN: [id number] Visit Date: 04/13/2023  Today's healthcare provider: Charlton Amor, DO   Chief Complaint  Patient presents with   Headache    SUBJECTIVE    Chief Complaint  Patient presents with   Headache   HPI  Pt has been having headaches that have induced emesis. She denies any history of high blood pressure. She does say she has not been feeling well. She has been trying to self treat allergies and has been using allegra. She notes sensitivity to her head. She was told not to take aleeve due to kidney issues on previous blood work. Her headache is worse at nighttime.   Review of Systems  Constitutional:  Negative for activity change, fatigue and fever.  Respiratory:  Negative for cough and shortness of breath.   Cardiovascular:  Negative for chest pain.  Gastrointestinal:  Positive for nausea. Negative for abdominal pain.  Genitourinary:  Negative for difficulty urinating.  Musculoskeletal:  Positive for neck pain.  Neurological:  Positive for headaches.       Current Meds  Medication Sig   ALPRAZolam (XANAX) 0.25 MG tablet Take 1 tablet (0.25 mg total) by mouth at bedtime as needed for anxiety.   cyclobenzaprine (FLEXERIL) 5 MG tablet Take 1 tablet (5 mg total) by mouth 3 (three) times daily as needed for muscle spasms.   diphenhydrAMINE (BENADRYL) 25 mg capsule Take 25 mg by mouth every 6 (six) hours as needed.   linaclotide (LINZESS) 290 MCG CAPS capsule Take 1 capsule (290 mcg total) by mouth daily.   MAGNESIUM PO Take by mouth. Takes once a week   omeprazole (PRILOSEC) 40 MG capsule Take 1 capsule (40 mg total) by mouth daily.   ondansetron (ZOFRAN) 4 MG tablet Take 1 tablet (4 mg total) by mouth every 8 (eight) hours as needed for nausea or vomiting.   SUMAtriptan (IMITREX) 50 MG tablet Take 1 tablet for migraine. May repeat in 2 hours if headache persists or recurs.  Max of two doses per day    OBJECTIVE    BP 108/73 (BP Location: Left Arm, Patient Position: Sitting, Cuff Size: Normal)   Pulse 76   Temp 98.6 F (37 C) (Oral)   Ht 5\' 6"  (1.676 m)   Wt 184 lb 1.9 oz (83.5 kg)   SpO2 100%   BMI 29.72 kg/m   Physical Exam Vitals and nursing note reviewed.  Constitutional:      General: She is not in acute distress.    Appearance: Normal appearance.     Comments: Lights dimmed in the exam room  HENT:     Head: Normocephalic and atraumatic.     Right Ear: External ear normal.     Left Ear: External ear normal.     Nose: Nose normal.  Eyes:     Conjunctiva/sclera: Conjunctivae normal.  Neck:     Comments: Tenderness to palpation of trapezius muscles and scm bilaterally Cardiovascular:     Rate and Rhythm: Normal rate and regular rhythm.  Pulmonary:     Effort: Pulmonary effort is normal.     Breath sounds: Normal breath sounds.  Neurological:     General: No focal deficit present.     Mental Status: She is alert and oriented to person, place, and time.     Cranial Nerves: No cranial nerve deficit.     Comments: CN II-XII  intact  Psychiatric:        Mood and Affect: Mood normal.        Behavior: Behavior normal.        Thought Content: Thought content normal.        Judgment: Judgment normal.          ASSESSMENT & PLAN    Problem List Items Addressed This Visit       Cardiovascular and Mediastinum   Migraine without status migrainosus, not intractable - Primary    -pt admits to headache associated with nausea and vomiting as well as sensitivity to light and sound. Lighting is dimmed on exam  - CN II-XII intact bilaterally  - based on history, it sounds like patient is having a migraine. Will go ahead and treat with sumatriptan - have instructed pt to take sumatriptan and zofran when she gets home (previous EKG shows no QT prolongation), encouraged her to take flexeril prior to bedtime and use a heating pad around neck. Did  instruct her a max of 2 sumatriptan 50mg  tablets per day.  - given work excuse for today. Pt did not want one extended further due to her need to go to work.  - gave RTC precautions      Relevant Medications   cyclobenzaprine (FLEXERIL) 5 MG tablet   SUMAtriptan (IMITREX) 50 MG tablet     Musculoskeletal and Integument   Muscle spasms of neck    - recommended heating pad and stretches  - have sent a prescription for flexiril  - likely pt has muscle spasm of neck that is worsening headache      Relevant Medications   cyclobenzaprine (FLEXERIL) 5 MG tablet     Other   Nausea   Relevant Medications   ondansetron (ZOFRAN) 4 MG tablet    No follow-ups on file.      Meds ordered this encounter  Medications   cyclobenzaprine (FLEXERIL) 5 MG tablet    Sig: Take 1 tablet (5 mg total) by mouth 3 (three) times daily as needed for muscle spasms.    Dispense:  30 tablet    Refill:  0   ondansetron (ZOFRAN) 4 MG tablet    Sig: Take 1 tablet (4 mg total) by mouth every 8 (eight) hours as needed for nausea or vomiting.    Dispense:  20 tablet    Refill:  0   SUMAtriptan (IMITREX) 50 MG tablet    Sig: Take 1 tablet for migraine. May repeat in 2 hours if headache persists or recurs. Max of two doses per day    Dispense:  30 tablet    Refill:  0    No orders of the defined types were placed in this encounter.    Charlton Amor, DO  [facility] at [facility] [phone] (phone) [phone] (fax)  [facility]

## 2023-04-13 NOTE — Assessment & Plan Note (Signed)
-   recommended heating pad and stretches  - have sent a prescription for flexiril  - likely pt has muscle spasm of neck that is worsening headache

## 2023-04-13 NOTE — Patient Instructions (Signed)
Migraine regimen - go home and take one zofran (4mg  tablet) tonight with the imitrex (sumatriptan) 50mg  tablet  - drink 16 oz of water or gatorade  - use a heating pad   If no better in two hours take a second and final dose of imitrex   Tonight prior to bed take flexiril--this will make you sleepy. No driving on this   Let me know if this works or doesn't work   In the morning prior to work you can take one imitrex and zofran  Drink lots of water  Send me a Programme researcher, broadcasting/film/video

## 2023-04-13 NOTE — Assessment & Plan Note (Signed)
-  pt admits to headache associated with nausea and vomiting as well as sensitivity to light and sound. Lighting is dimmed on exam  - CN II-XII intact bilaterally  - based on history, it sounds like patient is having a migraine. Will go ahead and treat with sumatriptan - have instructed pt to take sumatriptan and zofran when she gets home (previous EKG shows no QT prolongation), encouraged her to take flexeril prior to bedtime and use a heating pad around neck. Did instruct her a max of 2 sumatriptan 50mg  tablets per day.  - given work excuse for today. Pt did not want one extended further due to her need to go to work.  - gave RTC precautions

## 2023-04-14 ENCOUNTER — Encounter: Payer: Self-pay | Admitting: Family Medicine

## 2023-04-15 ENCOUNTER — Other Ambulatory Visit: Payer: Self-pay | Admitting: Family Medicine

## 2023-04-15 DIAGNOSIS — G43909 Migraine, unspecified, not intractable, without status migrainosus: Secondary | ICD-10-CM

## 2023-04-16 ENCOUNTER — Other Ambulatory Visit: Payer: Self-pay | Admitting: Family Medicine

## 2023-04-16 ENCOUNTER — Ambulatory Visit (HOSPITAL_COMMUNITY)
Admission: RE | Admit: 2023-04-16 | Discharge: 2023-04-16 | Disposition: A | Discharge status: Home / Self Care | Payer: Managed Care, Other (non HMO) | Source: Ambulatory Visit | Attending: Family Medicine | Admitting: Family Medicine

## 2023-04-16 DIAGNOSIS — R519 Headache, unspecified: Secondary | ICD-10-CM | POA: Diagnosis present

## 2023-04-16 MED ORDER — GADOBUTROL 1 MMOL/ML IV SOLN
8.0000 mL | Freq: Once | INTRAVENOUS | Status: AC | PRN
  Administered 2023-04-16: 8 mL via INTRAVENOUS

## 2023-04-16 MED ORDER — IOHEXOL 350 MG/ML SOLN
75.0000 mL | Freq: Once | INTRAVENOUS | Status: AC | PRN
  Administered 2023-04-16: 75 mL via INTRAVENOUS

## 2023-04-16 NOTE — Progress Notes (Signed)
Pt has had worsening headaches and tells me she has family hx of cerebral aneurysm. Father had cerebral aneurysm in his 70s. Will go ahead and order appropriate imaging.

## 2023-04-20 ENCOUNTER — Other Ambulatory Visit: Payer: Self-pay | Admitting: Family Medicine

## 2023-04-20 MED ORDER — NURTEC 75 MG PO TBDP: 1.0000 | ORAL_TABLET | Freq: Once | ORAL | 0 refills | Status: DC | PRN

## 2023-05-06 ENCOUNTER — Encounter: Payer: Self-pay | Admitting: Physician Assistant

## 2023-05-06 ENCOUNTER — Ambulatory Visit: Payer: Managed Care, Other (non HMO) | Admitting: Physician Assistant

## 2023-05-06 ENCOUNTER — Ambulatory Visit: Payer: Managed Care, Other (non HMO)

## 2023-05-06 VITALS — BP 108/71 | HR 74 | Ht 66.0 in | Wt 188.0 lb

## 2023-05-06 DIAGNOSIS — M5090 Cervical disc disorder, unspecified, unspecified cervical region: Secondary | ICD-10-CM

## 2023-05-06 DIAGNOSIS — Z1231 Encounter for screening mammogram for malignant neoplasm of breast: Secondary | ICD-10-CM | POA: Diagnosis not present

## 2023-05-06 DIAGNOSIS — R519 Headache, unspecified: Secondary | ICD-10-CM | POA: Diagnosis not present

## 2023-05-06 DIAGNOSIS — R110 Nausea: Secondary | ICD-10-CM

## 2023-05-06 DIAGNOSIS — M62838 Other muscle spasm: Secondary | ICD-10-CM | POA: Diagnosis not present

## 2023-05-06 MED ORDER — TOPIRAMATE 25 MG PO TABS
25.0000 mg | ORAL_TABLET | Freq: Two times a day (BID) | ORAL | 2 refills | Status: DC
Start: 2023-05-06 — End: 2023-05-27

## 2023-05-06 MED ORDER — CYCLOBENZAPRINE HCL 5 MG PO TABS
5.0000 mg | ORAL_TABLET | Freq: Three times a day (TID) | ORAL | 2 refills | Status: DC | PRN
Start: 2023-05-06 — End: 2023-07-20

## 2023-05-06 MED ORDER — ONDANSETRON HCL 4 MG PO TABS
4.0000 mg | ORAL_TABLET | Freq: Three times a day (TID) | ORAL | 2 refills | Status: DC | PRN
Start: 2023-05-06 — End: 2023-07-29

## 2023-05-06 MED ORDER — RIZATRIPTAN BENZOATE 10 MG PO TABS
10.0000 mg | ORAL_TABLET | ORAL | 5 refills | Status: DC | PRN
Start: 2023-05-06 — End: 2023-05-27

## 2023-05-06 MED ORDER — MELOXICAM 15 MG PO TABS: 15.0000 mg | ORAL_TABLET | Freq: Every day | ORAL | 2 refills | Status: DC

## 2023-05-06 NOTE — Progress Notes (Signed)
Established Patient Office Visit  Subjective   Patient ID: Anna Robinson, female    DOB: Apr 20, 1971  Age: 52 y.o. MRN: [id number]  No chief complaint on file.   HPI Pt is a 52 yo female who presents to the clinic for daily headaches since first of may. She is having neck pain, vision changes, nausea, headache that is severe to the point of not being able to work or concentrate. Imitrex given and did not like the way it made her feel. Went to chiropractor and seemed to help some.  Appt with neurology August 5th. No trauma. Flexeril seems to help some.    .. Active Ambulatory Problems    Diagnosis Date Noted   Muscle cramps 03/02/2015   Fatigue 01/15/2018   Epigastric pain 02/15/2018   Vitamin D insufficiency 01/26/2019   Elevated fasting glucose 01/26/2019   Dyslipidemia 01/26/2019   Elevated liver enzymes 02/14/2019   Right ovarian cyst 06/02/2019   Class 1 obesity due to excess calories without serious comorbidity with body mass index (BMI) of 30.0 to 30.9 in adult 02/01/2020   Fibrocystic changes of left breast 05/15/2020   Class 1 obesity due to excess calories without serious comorbidity with body mass index (BMI) of 31.0 to 31.9 in adult 02/19/2021   Chest pain 02/24/2022   Overweight (BMI 25.0-29.9) 02/24/2022   Gastroesophageal reflux disease without esophagitis 02/24/2022   Sinus arrhythmia seen on electrocardiogram 02/24/2022   Sinus bradycardia 02/24/2022   Anxiety 05/30/2022   DDD (degenerative disc disease), lumbar 12/01/2022   DDD (degenerative disc disease), 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   Mouth sores 12/01/2022   Lower abdominal pain 12/01/2022   Acute bilateral low back pain without sciatica 12/01/2022   Slow transit constipation 12/01/2022   Post-menopausal 04/10/2023   Migraine without status migrainosus, not intractable 04/13/2023   Nausea 04/13/2023   Muscle spasms of neck 04/13/2023   Cervical neck pain with evidence of disc disease 05/06/2023   Resolved  Ambulatory Problems    Diagnosis Date Noted   Right lower quadrant abdominal tenderness 03/02/2015   Diarrhea 02/15/2018   Dehydration, mild 02/15/2018   Past Medical History:  Diagnosis Date   Allergy    Constipation    GERD (gastroesophageal reflux disease)    IBS (irritable bowel syndrome)    PONV (postoperative nausea and vomiting)    Seasonal allergies      ROS   See HPI.  Objective:     BP 108/71   Pulse 74   Ht 5\' 6"  (1.676 m)   Wt 188 lb (85.3 kg)   SpO2 95%   BMI 30.34 kg/m  BP Readings from Last 3 Encounters:  05/06/23 108/71  04/13/23 108/73  03/30/23 (!) 131/50   Wt Readings from Last 3 Encounters:  05/06/23 188 lb (85.3 kg)  04/13/23 184 lb 1.9 oz (83.5 kg)  03/30/23 184 lb (83.5 kg)      Physical Exam Constitutional:      Appearance: Normal appearance.  HENT:     Head: Normocephalic.  Cardiovascular:     Rate and Rhythm: Normal rate and regular rhythm.  Pulmonary:     Effort: Pulmonary effort is normal.     Breath sounds: Normal breath sounds.  Musculoskeletal:     Cervical back: Normal range of motion and neck supple.     Right lower leg: No edema.     Left lower leg: No edema.     Comments: Paraspinal muscle tightness of cervical neck with some tenderness  over cervical spine NROM of neck  Neurological:     General: No focal deficit present.     Mental Status: She is alert and oriented to person, place, and time.  Psychiatric:        Mood and Affect: Mood normal.      The 10-year ASCVD risk score (Arnett DK, et al., 2019) is: 1.5%    Assessment & Plan:  Marland KitchenMarland KitchenDiagnoses and all orders for this visit:  Cervical neck pain with evidence of disc disease -     meloxicam (MOBIC) 15 MG tablet; Take 1 tablet (15 mg total) by mouth daily.  Nausea -     ondansetron (ZOFRAN) 4 MG tablet; Take 1 tablet (4 mg total) by mouth every 8 (eight) hours as needed for nausea or vomiting.  Muscle spasms of neck -     cyclobenzaprine (FLEXERIL) 5 MG  tablet; Take 1 tablet (5 mg total) by mouth 3 (three) times daily as needed for muscle spasms. -     meloxicam (MOBIC) 15 MG tablet; Take 1 tablet (15 mg total) by mouth daily.  Frequent headaches -     rizatriptan (MAXALT) 10 MG tablet; Take 1 tablet (10 mg total) by mouth as needed for migraine. May repeat in 2 hours if needed -     meloxicam (MOBIC) 15 MG tablet; Take 1 tablet (15 mg total) by mouth daily. -     topiramate (TOPAMAX) 25 MG tablet; Take 1 tablet (25 mg total) by mouth 2 (two) times daily.   FMLA paperwork filled out for missed work and intermittent missed work Topamax for prevention Mobic daily Maxalt as needed Suspected some of this is coming for DDD and muscle spasms PT ordered, consider dry needling Continue flexeril.  Continue chiropractor    Tandy Gaw, [name]

## 2023-05-12 ENCOUNTER — Encounter: Payer: Self-pay | Admitting: Physician Assistant

## 2023-05-12 NOTE — Progress Notes (Signed)
Normal mammogram. Follow up in 1 year.

## 2023-05-27 ENCOUNTER — Other Ambulatory Visit: Payer: Self-pay

## 2023-05-27 DIAGNOSIS — R519 Headache, unspecified: Secondary | ICD-10-CM

## 2023-05-27 DIAGNOSIS — K5901 Slow transit constipation: Secondary | ICD-10-CM

## 2023-05-27 DIAGNOSIS — K219 Gastro-esophageal reflux disease without esophagitis: Secondary | ICD-10-CM

## 2023-05-27 MED ORDER — RIZATRIPTAN BENZOATE 10 MG PO TABS
10.0000 mg | ORAL_TABLET | ORAL | 5 refills | Status: DC | PRN
Start: 2023-05-27 — End: 2023-08-05

## 2023-05-27 MED ORDER — OMEPRAZOLE 40 MG PO CPDR
40.0000 mg | DELAYED_RELEASE_CAPSULE | Freq: Every day | ORAL | 0 refills | Status: DC
Start: 2023-05-27 — End: 2023-07-29

## 2023-05-27 MED ORDER — TOPIRAMATE 25 MG PO TABS
25.0000 mg | ORAL_TABLET | Freq: Two times a day (BID) | ORAL | 0 refills | Status: DC
Start: 2023-05-27 — End: 2023-07-29

## 2023-05-27 MED ORDER — LINACLOTIDE 290 MCG PO CAPS
290.0000 ug | ORAL_CAPSULE | Freq: Every day | ORAL | 0 refills | Status: DC
Start: 2023-05-27 — End: 2024-03-30

## 2023-05-28 ENCOUNTER — Other Ambulatory Visit: Payer: Self-pay

## 2023-05-28 ENCOUNTER — Ambulatory Visit: Payer: Managed Care, Other (non HMO) | Attending: Physician Assistant | Admitting: Physical Therapy

## 2023-05-28 DIAGNOSIS — R293 Abnormal posture: Secondary | ICD-10-CM | POA: Diagnosis present

## 2023-05-28 DIAGNOSIS — M546 Pain in thoracic spine: Secondary | ICD-10-CM | POA: Insufficient documentation

## 2023-05-28 DIAGNOSIS — R519 Headache, unspecified: Secondary | ICD-10-CM | POA: Insufficient documentation

## 2023-05-28 DIAGNOSIS — M6281 Muscle weakness (generalized): Secondary | ICD-10-CM | POA: Insufficient documentation

## 2023-05-28 DIAGNOSIS — M542 Cervicalgia: Secondary | ICD-10-CM | POA: Insufficient documentation

## 2023-05-28 DIAGNOSIS — M5090 Cervical disc disorder, unspecified, unspecified cervical region: Secondary | ICD-10-CM | POA: Insufficient documentation

## 2023-05-28 DIAGNOSIS — M62838 Other muscle spasm: Secondary | ICD-10-CM | POA: Insufficient documentation

## 2023-05-28 NOTE — Therapy (Signed)
OUTPATIENT PHYSICAL THERAPY CERVICAL EVALUATION   Patient Name: Anna Robinson MRN: [id number] DOB:1971-12-06, 52 y.o., female Today's Date: 05/28/2023  END OF SESSION:  PT End of Session - 05/28/23 1623     Visit Number 1    Number of Visits 12    Date for PT Re-Evaluation 07/09/23    Authorization Type [employer]    PT Start Time 1620    PT Stop Time 1700    PT Time Calculation (min) 40 min             Past Medical History:  Diagnosis Date   Allergy    Anxiety    Constipation    GERD (gastroesophageal reflux disease)    IBS (irritable bowel syndrome)    PONV (postoperative nausea and vomiting)    Seasonal allergies    Past Surgical History:  Procedure Laterality Date   COLONOSCOPY     ~10 yrs- normal per pt    TOTAL ABDOMINAL HYSTERECTOMY     WISDOM TOOTH EXTRACTION     age [age]   Patient Active Problem List   Diagnosis Date Noted   Cervical neck pain with evidence of disc disease 05/06/2023   Migraine without status migrainosus, not intractable 04/13/2023   Nausea 04/13/2023   Muscle spasms of neck 04/13/2023   Post-menopausal 04/10/2023   DDD (degenerative disc disease), lumbar 12/01/2022   DDD (degenerative disc disease), 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   Mouth sores 12/01/2022   Lower abdominal pain 12/01/2022   Acute bilateral low back pain without sciatica 12/01/2022   Slow transit constipation 12/01/2022   Anxiety 05/30/2022   Chest pain 02/24/2022   Overweight (BMI 25.0-29.9) 02/24/2022   Gastroesophageal reflux disease without esophagitis 02/24/2022   Sinus arrhythmia seen on electrocardiogram 02/24/2022   Sinus bradycardia 02/24/2022   Class 1 obesity due to excess calories without serious comorbidity with body mass index (BMI) of 31.0 to 31.9 in adult 02/19/2021   Fibrocystic changes of left breast 05/15/2020   Class 1 obesity due to excess calories without serious comorbidity with body mass index (BMI) of 30.0 to 30.9 in adult 02/01/2020   Right  ovarian cyst 06/02/2019   Elevated liver enzymes 02/14/2019   Vitamin D insufficiency 01/26/2019   Elevated fasting glucose 01/26/2019   Dyslipidemia 01/26/2019   Epigastric pain 02/15/2018   Fatigue 01/15/2018   Muscle cramps 03/02/2015    PCP: Jomarie Longs, [name]  REFERRING PROVIDER: Jomarie Longs, [name]  REFERRING DIAG: [phone] (ICD-10-CM) - Muscle spasms of neck M50.90 (ICD-10-CM) - Cervical neck pain with evidence of disc disease R51.9 (ICD-10-CM) - Frequent headaches  THERAPY DIAG:  Cervicalgia  Pain in thoracic spine  Muscle weakness (generalized)  Abnormal posture  Rationale for Evaluation and Treatment: Rehabilitation  ONSET DATE: January 2024  SUBJECTIVE:  SUBJECTIVE STATEMENT: Pt states it started with muscle spasm and pain in her R upper middle back. Since then it has started going to the L side translates to her neck and into her head. Pt states it hurts all over her head. Pt reports she has had a migraine and headache daily. Pt reports light sensitivity and visual changes. Pt has started going to the chiropractor a couple of weeks now. Pt gets massages regularly. Pt states it helps but it doesn't go all the way. Pt states she is a side sleeper. Has grand children and will carry them on her right side. Pt reports she has gotten dizziness and nausea.  Hand dominance: Right  PERTINENT HISTORY:  N/a  PAIN:  Are you having pain? Yes: NPRS scale: currently 5 or 6; at worst 10/10 Pain location: Sometimes in front of eye brow, sometimes jaw, sometimes on top of head, today more in the back of the head Pain description: "feels like I was scraping on something" Aggravating factors: Some mornings, sometimes certain head movements Relieving factors: medication,  chiropractor, massage  PRECAUTIONS: None  WEIGHT BEARING RESTRICTIONS: No  FALLS:  Has patient fallen in last 6 months? 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 No large vessel occlusion or proximal hemodynamically significant stenosis. 2. No aneurysm identified.     Electronically Signed   By: Feliberto Harts M.D.   On: 04/16/2023 17:23  IMPRESSION: Normal MRI of the brain.     Electronically Signed   By: Orvan Falconer M.D.   On: 04/16/2023 14:53  PATIENT SURVEYS:  FOTO 58; predicted 69  COGNITION: Overall cognitive status: Within functional limits for tasks assessed  SENSATION: WFL  POSTURE: rounded shoulders and forward head  PALPATION: TTP R periscapular muscles, thoracic paraspinals, bilat UTs, bilat cervical paraspinals and suboccipitals   CERVICAL ROM:   Active ROM A/PROM (deg) eval  Flexion 30  Extension 45  Right lateral flexion 39  Left lateral flexion 30*  Right rotation 44  Left rotation 52   (Blank rows = not tested)  UPPER EXTREMITY ROM:  Active ROM Right eval Left eval  Shoulder flexion WFL pulls into mid back [facility]  Shoulder extension    Shoulder abduction WFL pulls into mid back [facility]  Shoulder adduction    Shoulder extension    Shoulder internal rotation T10* T4  Shoulder external rotation T2 T2  Elbow flexion    Elbow extension    Wrist flexion    Wrist extension    Wrist ulnar deviation    Wrist radial deviation    Wrist pronation    Wrist supination     (Blank rows = not tested) * pain  UPPER EXTREMITY MMT:  MMT Right eval Left eval  Shoulder flexion 3+ 3+  Shoulder extension 4 5  Shoulder abduction 3+ 3+  Shoulder adduction    Shoulder internal  rotation 3 5  Shoulder external rotation 3+ 5  Middle trapezius    Lower trapezius    Elbow flexion    Elbow extension    Wrist flexion    Wrist extension    Wrist ulnar deviation    Wrist radial deviation    Wrist pronation    Wrist supination    Grip strength     (  Blank rows = not tested)  CERVICAL SPECIAL TESTS:  Did not assess  FUNCTIONAL TESTS:  Did not assess  TODAY'S TREATMENT:                                                                                                                              DATE: 05/28/23 Skilled assessment and palpation for TPDN Trigger Point Dry-Needling  Treatment instructions: Expect mild to moderate muscle soreness. S/S of pneumothorax if dry needled over a lung field, and to seek immediate medical attention should they occur. Patient verbalized understanding of these instructions and education.  Patient Consent Given: Yes Education handout provided: Yes Muscles treated: Bilat UT, rhomboids, thoracic paraspinals, cervical paraspinals, suboccipitals Electrical stimulation performed: No Parameters: N/A Treatment response/outcome: Decreased muscle tension, twitch response    PATIENT EDUCATION:  Education details: TPDN, exam findings, POC, initial HEP Person educated: Patient Education method: Explanation, Demonstration, and Handouts Education comprehension: verbalized understanding, returned demonstration, and needs further education  HOME EXERCISE PROGRAM: Did not initiate  ASSESSMENT:  CLINICAL IMPRESSION: Patient is a 52 y.o. F who was seen today for physical therapy evaluation and treatment for thoracic and cervical pain. Assessment significant for multiple muscles in spasm including UTs, suboccipitals, and periscapular muscles likely related to her headaches and dizziness. Pt demos decreased cervical ROM with R>L rotator cuff weakness and decreased postural stability. Pt would highly benefit from PT to address these issues to  improve pain for work and home tasks. Performed trial of TPDN this session to see if it will decrease pt's pain.  OBJECTIVE IMPAIRMENTS: decreased activity tolerance, decreased endurance, decreased mobility, decreased ROM, decreased strength, increased fascial restrictions, increased muscle spasms, impaired UE functional use, postural dysfunction, and pain.   ACTIVITY LIMITATIONS: lifting, sleeping, bathing, toileting, dressing, reach over head, and hygiene/grooming  PARTICIPATION LIMITATIONS: cleaning, community activity, and occupation  PERSONAL FACTORS: Age, Fitness, Past/current experiences, and Time since onset of injury/illness/exacerbation are also affecting patient's functional outcome.   REHAB POTENTIAL: Good  CLINICAL DECISION MAKING: Evolving/moderate complexity  EVALUATION COMPLEXITY: Moderate   GOALS: Goals reviewed with patient? Yes  SHORT TERM GOALS: Target date: 06/26/2023   Pt will be ind with initial HEP Baseline:  Goal status: INITIAL  2.  Pt will report >/=50% improvement in headaches Baseline:  Goal status: INITIAL  3.  Pt will be ind with maintaining postural stability for working at her desk Baseline:  Goal status: INITIAL    LONG TERM GOALS: Target date: 07/24/2023  Pt will be ind with management and progression of HEP Baseline:  Goal status: INITIAL  2.  Pt will be able to demo at least 4/5 periscapular muscles for improved postural stability Baseline:  Goal status: INITIAL  3.  Pt will report >/=75% improvement in pain Baseline:  Goal status: INITIAL  4.  Pt will have increased FOTO score to >/=69 Baseline:  Goal status: INITIAL    PLAN:  PT FREQUENCY: 2x/week  PT DURATION: 8  weeks  PLANNED INTERVENTIONS: Therapeutic exercises, Therapeutic activity, Neuromuscular re-education, Balance training, Gait training, Patient/Family education, Self Care, Joint mobilization, Vestibular training, Aquatic Therapy, Dry Needling, Electrical  stimulation, Spinal mobilization, Cryotherapy, Moist heat, Taping, Traction, Ionotophoresis 4mg /ml Dexamethasone, Manual therapy, and Re-evaluation  PLAN FOR NEXT SESSION: Initiate HEP for midback strengthening and neck stretching. Manual work/TPDN as indicated.    Lori-Ann Lindfors Burnice Ma L Ausencio Vaden, PT 05/28/2023, 5:20 PM

## 2023-05-29 ENCOUNTER — Ambulatory Visit: Payer: Managed Care, Other (non HMO) | Admitting: Physician Assistant

## 2023-06-01 ENCOUNTER — Encounter: Payer: Self-pay | Admitting: Rehabilitative and Restorative Service Providers"

## 2023-06-01 ENCOUNTER — Ambulatory Visit: Payer: Managed Care, Other (non HMO) | Admitting: Rehabilitative and Restorative Service Providers"

## 2023-06-01 DIAGNOSIS — M542 Cervicalgia: Secondary | ICD-10-CM | POA: Diagnosis not present

## 2023-06-01 DIAGNOSIS — M6281 Muscle weakness (generalized): Secondary | ICD-10-CM

## 2023-06-01 DIAGNOSIS — M546 Pain in thoracic spine: Secondary | ICD-10-CM

## 2023-06-01 DIAGNOSIS — R293 Abnormal posture: Secondary | ICD-10-CM

## 2023-06-01 NOTE — Therapy (Signed)
OUTPATIENT PHYSICAL THERAPY CERVICAL TREATMENT   Patient Name: Anna Robinson MRN: [id number] DOB:08-01-1971, 52 y.o., female Today's Date: 06/01/2023  END OF SESSION:  PT End of Session - 06/01/23 1400     Visit Number 2    Number of Visits 12    Date for PT Re-Evaluation 07/09/23    Authorization Type [employer]    PT Start Time 1400    PT Stop Time 1448    PT Time Calculation (min) 48 min    Activity Tolerance Patient tolerated treatment well             Past Medical History:  Diagnosis Date   Allergy    Anxiety    Constipation    GERD (gastroesophageal reflux disease)    IBS (irritable bowel syndrome)    PONV (postoperative nausea and vomiting)    Seasonal allergies    Past Surgical History:  Procedure Laterality Date   COLONOSCOPY     ~10 yrs- normal per pt    TOTAL ABDOMINAL HYSTERECTOMY     WISDOM TOOTH EXTRACTION     age [age]   Patient Active Problem List   Diagnosis Date Noted   Cervical neck pain with evidence of disc disease 05/06/2023   Migraine without status migrainosus, not intractable 04/13/2023   Nausea 04/13/2023   Muscle spasms of neck 04/13/2023   Post-menopausal 04/10/2023   DDD (degenerative disc disease), lumbar 12/01/2022   DDD (degenerative disc disease), 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   Mouth sores 12/01/2022   Lower abdominal pain 12/01/2022   Acute bilateral low back pain without sciatica 12/01/2022   Slow transit constipation 12/01/2022   Anxiety 05/30/2022   Chest pain 02/24/2022   Overweight (BMI 25.0-29.9) 02/24/2022   Gastroesophageal reflux disease without esophagitis 02/24/2022   Sinus arrhythmia seen on electrocardiogram 02/24/2022   Sinus bradycardia 02/24/2022   Class 1 obesity due to excess calories without serious comorbidity with body mass index (BMI) of 31.0 to 31.9 in adult 02/19/2021   Fibrocystic changes of left breast 05/15/2020   Class 1 obesity due to excess calories without serious comorbidity with body mass index  (BMI) of 30.0 to 30.9 in adult 02/01/2020   Right ovarian cyst 06/02/2019   Elevated liver enzymes 02/14/2019   Vitamin D insufficiency 01/26/2019   Elevated fasting glucose 01/26/2019   Dyslipidemia 01/26/2019   Epigastric pain 02/15/2018   Fatigue 01/15/2018   Muscle cramps 03/02/2015    PCP: Jomarie Longs, [name]  REFERRING PROVIDER: Jomarie Longs, [name]  REFERRING DIAG: [phone] (ICD-10-CM) - Muscle spasms of neck M50.90 (ICD-10-CM) - Cervical neck pain with evidence of disc disease R51.9 (ICD-10-CM) - Frequent headaches  THERAPY DIAG:  Cervicalgia  Pain in thoracic spine  Muscle weakness (generalized)  Abnormal posture  Rationale for Evaluation and Treatment: Rehabilitation  ONSET DATE: January 2024  SUBJECTIVE:  SUBJECTIVE STATEMENT: Patient reports that she had some increased pain in the Rt upper back and neck area after the DN but it felt better the following day. She felt better for a couple of days then started noticing increased spasms yesterday and it has remained tight and painful through today. Pain and tightness go to the Rt rib area and all the way up. Taking muscle relaxers and antiinflammatory meds to try to help with the pain. 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   Pt states it started with muscle spasm and pain in her R upper middle back. Since then it has started going to the L side translates to her neck and into her head. Pt states it hurts all over her head. Pt reports she has had a migraine and headache daily. Pt reports light sensitivity and visual changes. Pt has started going to the chiropractor a couple of weeks now. Pt gets massages regularly. Pt states it helps but it doesn't go all the way. Pt states she is  a side sleeper. Has grand children and will carry them on her right side. Pt reports she has gotten dizziness and nausea.  Hand dominance: Right  PERTINENT HISTORY:  N/a  PAIN:  Are you having pain? Yes: NPRS scale: currently 8/10; at worst 10/10 Pain location: Sometimes in front of eye brow, sometimes jaw, sometimes on top of head, today more in the back of the head Pain description: "feels like I was scraping on something" Aggravating factors: Some mornings, sometimes certain head movements Relieving factors: medication, chiropractor, massage  PRECAUTIONS: None  WEIGHT BEARING RESTRICTIONS: No  FALLS:  Has patient fallen in last 6 months? 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 No large vessel occlusion or proximal hemodynamically significant stenosis. 2. No aneurysm identified.     Electronically Signed   By: Feliberto Harts M.D.   On: 04/16/2023 17:23  IMPRESSION: Normal MRI of the brain.     Electronically Signed   By: Orvan Falconer M.D.   On: 04/16/2023 14:53  PATIENT SURVEYS:  FOTO 58; predicted 69   POSTURE: rounded shoulders and forward head  PALPATION: TTP R periscapular muscles, thoracic paraspinals, bilat UTs, bilat cervical paraspinals and suboccipitals   CERVICAL ROM:   Active ROM A/PROM (deg) eval  Flexion 30  Extension 45  Right lateral flexion 39  Left lateral flexion 30*  Right rotation 44  Left rotation 52   (Blank rows = not tested)  UPPER EXTREMITY ROM:  Active ROM Right eval Left eval  Shoulder flexion WFL pulls into mid back [facility]  Shoulder extension    Shoulder abduction WFL pulls into mid back [facility]  Shoulder adduction    Shoulder extension    Shoulder internal rotation T10* T4   Shoulder external rotation T2 T2  Elbow flexion    Elbow extension    Wrist flexion    Wrist extension    Wrist ulnar deviation    Wrist radial deviation    Wrist pronation    Wrist supination     (Blank rows = not tested) * pain  UPPER EXTREMITY MMT:  MMT Right eval Left eval  Shoulder flexion 3+ 3+  Shoulder extension 4 5  Shoulder abduction 3+  3+  Shoulder adduction    Shoulder internal rotation 3 5  Shoulder external rotation 3+ 5  Middle trapezius    Lower trapezius    Elbow flexion    Elbow extension    Wrist flexion    Wrist extension    Wrist ulnar deviation    Wrist radial deviation    Wrist pronation    Wrist supination    Grip strength     (Blank rows = not tested)  CERVICAL SPECIAL TESTS:  Did not assess  FUNCTIONAL TESTS:  Did not assess  TODAY'S TREATMENT:                                                                                                                              DATE: 06/01/23 Therapeutic Exercises: Standing: Chin tuck 5 sec x 5 w/noodle  Scap squeeze 5 sec x 5 w/ noodle  Backward shoulder rolls x 10  Sitting  Lateral cervical flexion stretch 10 sec x 3 R/L  Supine  Diaphragmatic breathing count of 6  Lower trunk rotation 10 sec x 4 R/L   Manual therapy:  Skilled assessment and palpation for manual work and [employer] Trigger Point Dry-Needling  Treatment instructions: Expect mild to moderate muscle soreness. S/S of pneumothorax if dry needled over a lung field, and to seek immediate medical attention should they occur. Patient verbalized understanding of these instructions and education.  Patient Consent Given: Yes Education handout provided: Yes Muscles treated: R UT, rhomboids, thoracic paraspinals, bilat cervical paraspinals, suboccipitals Electrical stimulation performed: No Parameters: N/A Treatment response/outcome: Decreased muscle tension, twitch response Modalities: TENs R thoracic spine           MH thoracic and  cervical spine x 10 min   DATE: 05/28/23 Skilled assessment and palpation for [facility] Trigger Point Dry-Needling  Treatment instructions: Expect mild to moderate muscle soreness. S/S of pneumothorax if dry needled over a lung field, and to seek immediate medical attention should they occur. Patient verbalized understanding of these instructions and education.  Patient Consent Given: Yes Education handout provided: Yes Muscles treated: Bilat UT, rhomboids, thoracic paraspinals, cervical paraspinals, suboccipitals Electrical stimulation performed: No Parameters: N/A Treatment response/outcome: Decreased muscle tension, twitch response    PATIENT EDUCATION:  Education details: TPDN, exam findings, POC, initial HEP Person educated: Patient Education method: Explanation, Demonstration, and Handouts Education comprehension: verbalized understanding, returned demonstration, and needs further education  HOME EXERCISE PROGRAM:  Access Code: [email or web address] URL: [email or web address] Date: 06/01/2023 Prepared by: Corlis Leak  Exercises - Seated Cervical Retraction  - 2 x daily - 7 x weekly - 1-2 sets - 5-10 reps - 10 sec  hold - Seated Scapular Retraction  - 2 x daily - 7 x weekly - 1-2 sets - 10 reps - 10 sec  hold - Standing Backward Shoulder Rolls  - 2 x daily - 7 x weekly - 1 sets - 10 reps - 1-2 sec  hold - Standing Cervical Retraction  with Sidebending  - 2 x daily - 7 x weekly - 1 sets - 5-6 reps - 10-15 sec  hold - Supine Diaphragmatic Breathing  - 2 x daily - 7 x weekly - 1 sets - 10 reps - 4-6 sec  hold - Supine Lower Trunk Rotation  - 2 x daily - 7 x weekly - 1 sets - 3-5 reps - 20-30 sec  hold  Patient Education - TENS Unit - Office Posture  ASSESSMENT:  CLINICAL IMPRESSION: Patient reports some temporary relief of severe pain and headache but symptoms increased yesterday. Continued education re-posture and alignment; nature of musculoskeletal pain and dysfunction;  nature of chronic pain. Provided some exercises for patient to begin for HEP. Continued with manual work and DN as noted. Trial of TENS unit and MH for management of pain for home.   EVAL: Patient is a 52 y.o. F who was seen today for physical therapy evaluation and treatment for thoracic and cervical pain. Assessment significant for multiple muscles in spasm including UTs, suboccipitals, and periscapular muscles likely related to her headaches and dizziness. Pt demos decreased cervical ROM with R>L rotator cuff weakness and decreased postural stability. Pt would highly benefit from PT to address these issues to improve pain for work and home tasks. Performed trial of TPDN this session to see if it will decrease pt's pain.  OBJECTIVE IMPAIRMENTS:  multiple muscles in spasm including UTs, suboccipitals, and periscapular muscles likely related to her headaches and dizziness. Pt demos decreased cervical ROM with R>L rotator cuff weakness and decreased postural stability decreased activity tolerance, decreased endurance, decreased mobility, decreased ROM, decreased strength, increased fascial restrictions, increased muscle spasms, impaired UE functional use, postural dysfunction, and pain.    GOALS: Goals reviewed with patient? Yes  SHORT TERM GOALS: Target date: 06/26/2023   Pt will be ind with initial HEP Baseline:  Goal status: INITIAL  2.  Pt will report >/=50% improvement in headaches Baseline:  Goal status: INITIAL  3.  Pt will be ind with maintaining postural stability for working at her desk Baseline:  Goal status: INITIAL    LONG TERM GOALS: Target date: 07/24/2023  Pt will be ind with management and progression of HEP Baseline:  Goal status: INITIAL  2.  Pt will be able to demo at least 4/5 periscapular muscles for improved postural stability Baseline:  Goal status: INITIAL  3.  Pt will report >/=75% improvement in pain Baseline:  Goal status: INITIAL  4.  Pt will have  increased FOTO score to >/=69 Baseline:  Goal status: INITIAL    PLAN:  PT FREQUENCY: 2x/week  PT DURATION: 8 weeks  PLANNED INTERVENTIONS: Therapeutic exercises, Therapeutic activity, Neuromuscular re-education, Balance training, Gait training, Patient/Family education, Self Care, Joint mobilization, Vestibular training, Aquatic Therapy, Dry Needling, Electrical stimulation, Spinal mobilization, Cryotherapy, Moist heat, Taping, Traction, Ionotophoresis 4mg /ml Dexamethasone, Manual therapy, and Re-evaluation  PLAN FOR NEXT SESSION: Initiate HEP for midback strengthening and neck stretching. Manual work/TPDN as indicated.    Val Riles, PT 06/01/2023, 5:15 PM

## 2023-06-03 ENCOUNTER — Ambulatory Visit: Payer: Managed Care, Other (non HMO) | Admitting: Physical Therapy

## 2023-06-03 ENCOUNTER — Encounter: Payer: Self-pay | Admitting: Physical Therapy

## 2023-06-03 DIAGNOSIS — M542 Cervicalgia: Secondary | ICD-10-CM | POA: Diagnosis not present

## 2023-06-03 DIAGNOSIS — R293 Abnormal posture: Secondary | ICD-10-CM

## 2023-06-03 DIAGNOSIS — M546 Pain in thoracic spine: Secondary | ICD-10-CM

## 2023-06-03 DIAGNOSIS — M6281 Muscle weakness (generalized): Secondary | ICD-10-CM

## 2023-06-03 NOTE — Therapy (Signed)
OUTPATIENT PHYSICAL THERAPY CERVICAL TREATMENT   Patient Name: Anna Robinson MRN: [id number] DOB:[birth year], 52 y.o., female Today's Date: 06/03/2023  END OF SESSION:  PT End of Session - 06/03/23 0759     Visit Number 3    Number of Visits 12    Date for PT Re-Evaluation 07/09/23    Authorization Type [employer]    PT Start Time 0800    PT Stop Time 0840    PT Time Calculation (min) 40 min    Activity Tolerance Patient tolerated treatment well              Past Medical History:  Diagnosis Date   Allergy    Anxiety    Constipation    GERD (gastroesophageal reflux disease)    IBS (irritable bowel syndrome)    PONV (postoperative nausea and vomiting)    Seasonal allergies    Past Surgical History:  Procedure Laterality Date   COLONOSCOPY     ~10 yrs- normal per pt    TOTAL ABDOMINAL HYSTERECTOMY     WISDOM TOOTH EXTRACTION     age 5-17   Patient Active Problem List   Diagnosis Date Noted   Cervical neck pain with evidence of disc disease 05/06/2023   Migraine without status migrainosus, not intractable 04/13/2023   Nausea 04/13/2023   Muscle spasms of neck 04/13/2023   Post-menopausal 04/10/2023   DDD (degenerative disc disease), lumbar 12/01/2022   DDD (degenerative disc disease), 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   Mouth sores 12/01/2022   Lower abdominal pain 12/01/2022   Acute bilateral low back pain without sciatica 12/01/2022   Slow transit constipation 12/01/2022   Anxiety 05/30/2022   Chest pain 02/24/2022   Overweight (BMI 25.0-29.9) 02/24/2022   Gastroesophageal reflux disease without esophagitis 02/24/2022   Sinus arrhythmia seen on electrocardiogram 02/24/2022   Sinus bradycardia 02/24/2022   Class 1 obesity due to excess calories without serious comorbidity with body mass index (BMI) of 31.0 to 31.9 in adult 02/19/2021   Fibrocystic changes of left breast 05/15/2020   Class 1 obesity due to excess calories without serious comorbidity with body mass index  (BMI) of 30.0 to 30.9 in adult 02/01/2020   Right ovarian cyst 06/02/2019   Elevated liver enzymes 02/14/2019   Vitamin D insufficiency 01/26/2019   Elevated fasting glucose 01/26/2019   Dyslipidemia 01/26/2019   Epigastric pain 02/15/2018   Fatigue 01/15/2018   Muscle cramps 03/02/2015    PCP: Jomarie Longs, [name]  REFERRING PROVIDER: Jomarie Longs, [name]  REFERRING DIAG: [phone] (ICD-10-CM) - Muscle spasms of neck M50.90 (ICD-10-CM) - Cervical neck pain with evidence of disc disease R51.9 (ICD-10-CM) - Frequent headaches  THERAPY DIAG:  Cervicalgia  Pain in thoracic spine  Muscle weakness (generalized)  Abnormal posture  Rationale for Evaluation and Treatment: Rehabilitation  ONSET DATE: January 2024  SUBJECTIVE:  SUBJECTIVE STATEMENT: Pt states she feels the dry needling is helping. Has not had to take extra migraine medication. Hand dominance: Right  PERTINENT HISTORY:  N/a  PAIN:  Are you having pain? Yes: NPRS scale: currently 8/10; at worst 10/10 Pain location: Sometimes in front of eye brow, sometimes jaw, sometimes on top of head, today more in the back of the head Pain description: "feels like I was scraping on something" Aggravating factors: Some mornings, sometimes certain head movements Relieving factors: medication, chiropractor, massage  PRECAUTIONS: None  WEIGHT BEARING RESTRICTIONS: No  FALLS:  Has patient fallen in last 6 months? 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 No large vessel occlusion or  proximal hemodynamically significant stenosis. 2. No aneurysm identified.     Electronically Signed   By: Feliberto Harts M.D.   On: 04/16/2023 17:23  IMPRESSION: Normal MRI of the brain.     Electronically Signed   By: Orvan Falconer M.D.   On: 04/16/2023 14:53  PATIENT SURVEYS:  FOTO 58; predicted 69   POSTURE: rounded shoulders and forward head  PALPATION: TTP R periscapular muscles, thoracic paraspinals, bilat UTs, bilat cervical paraspinals and suboccipitals   CERVICAL ROM:   Active ROM A/PROM (deg) eval  Flexion 30  Extension 45  Right lateral flexion 39  Left lateral flexion 30*  Right rotation 44  Left rotation 52   (Blank rows = not tested)  UPPER EXTREMITY ROM:  Active ROM Right eval Left eval  Shoulder flexion WFL pulls into mid back [facility]  Shoulder extension    Shoulder abduction WFL pulls into mid back [facility]  Shoulder adduction    Shoulder extension    Shoulder internal rotation T10* T4  Shoulder external rotation T2 T2  Elbow flexion    Elbow extension    Wrist flexion    Wrist extension    Wrist ulnar deviation    Wrist radial deviation    Wrist pronation    Wrist supination     (Blank rows = not tested) * pain  UPPER EXTREMITY MMT:  MMT Right eval Left eval  Shoulder flexion 3+ 3+  Shoulder extension 4 5  Shoulder abduction 3+ 3+  Shoulder adduction    Shoulder internal rotation 3 5  Shoulder external rotation 3+ 5  Middle trapezius    Lower trapezius    Elbow flexion    Elbow extension    Wrist flexion    Wrist extension    Wrist ulnar deviation    Wrist radial deviation    Wrist pronation    Wrist supination    Grip strength     (Blank rows = not tested)  CERVICAL SPECIAL TESTS:  Did not assess  FUNCTIONAL TESTS:  Did not assess  TODAY'S TREATMENT:  [facility] Adult PT Treatment:                                                 DATE: 06/03/23 Therapeutic Exercise: UBE L1; 2 min fwd, 2 min bwd UT stretch x 30 sec Levator scap stretch x 30 sec Doorway pec stretch x 30 sec low, mid, high Shoulder ER red TB 2x10 "W" red TB 2x10 Horizontal shoulder abd red TB 2x10 Row at cables x10 10# Shoulder ext at cables x10 10# Manual Therapy: STM & TPR UT, cervical/thoracic paraspinals Skilled assessment and palpation for TPDN Trigger Point Dry-Needling  Treatment instructions: Expect mild to moderate muscle soreness. S/S of pneumothorax if dry needled over a lung field, and to seek immediate medical attention should they occur. Patient verbalized understanding of these instructions and education.  Patient Consent Given: Yes Education handout provided: Previously provided Muscles treated: R UT, bilat cervical paraspinals, R thoracic paraspinals, suboccipitals, rhomboids Electrical stimulation performed: No Parameters: N/A Treatment response/outcome: Twitch response ilicited, increased muscle length   DATE: 06/01/23 Therapeutic Exercises: Standing: Chin tuck 5 sec x 5 w/noodle  Scap squeeze 5 sec x 5 w/ noodle  Backward shoulder rolls x 10  Sitting  Lateral cervical flexion stretch 10 sec x 3 R/L  Supine  Diaphragmatic breathing count of 6  Lower trunk rotation 10 sec x 4 R/L   Manual therapy:  Skilled assessment and palpation for manual work and [employer] Trigger Point Dry-Needling  Treatment instructions: Expect mild to moderate muscle soreness. S/S of pneumothorax if dry needled over a lung field, and to seek immediate medical attention should they occur. Patient verbalized understanding of these instructions and education.  Patient Consent Given: Yes Education handout provided: Yes Muscles treated: R UT, rhomboids, thoracic paraspinals, bilat cervical paraspinals, suboccipitals Electrical stimulation performed: No Parameters: N/A Treatment response/outcome: Decreased muscle  tension, twitch response Modalities: TENs R thoracic spine           MH thoracic and cervical spine x 10 min   DATE: 05/28/23 Skilled assessment and palpation for [facility] Trigger Point Dry-Needling  Treatment instructions: Expect mild to moderate muscle soreness. S/S of pneumothorax if dry needled over a lung field, and to seek immediate medical attention should they occur. Patient verbalized understanding of these instructions and education.  Patient Consent Given: Yes Education handout provided: Yes Muscles treated: Bilat UT, rhomboids, thoracic paraspinals, cervical paraspinals, suboccipitals Electrical stimulation performed: No Parameters: N/A Treatment response/outcome: Decreased muscle tension, twitch response    PATIENT EDUCATION:  Education details: TPDN, exam findings, POC, initial HEP Person educated: Patient Education method: Explanation, Demonstration, and Handouts Education comprehension: verbalized understanding, returned demonstration, and needs further education  HOME EXERCISE PROGRAM:  Access Code: [email or web address] URL: [email or web address] Date: 06/03/2023 Prepared by: Vernon Prey Belkys Kirstie Peri  Exercises - Seated Cervical Retraction  - 2 x daily - 7 x weekly - 1-2 sets - 5-10 reps - 10 sec  hold - Seated Scapular Retraction  - 2 x daily - 7 x weekly - 1-2 sets - 10 reps - 10 sec  hold - Standing Backward Shoulder Rolls  - 2 x daily - 7 x weekly - 1 sets - 10 reps - 1-2 sec  hold - Standing Cervical Retraction with Sidebending  - 2 x daily - 7 x weekly - 1 sets - 5-6 reps - 10-15 sec  hold -  Supine Diaphragmatic Breathing  - 2 x daily - 7 x weekly - 1 sets - 10 reps - 4-6 sec  hold - Supine Lower Trunk Rotation  - 2 x daily - 7 x weekly - 1 sets - 3-5 reps - 20-30 sec  hold - Shoulder External Rotation and Scapular Retraction with Resistance  - 1 x daily - 7 x weekly - 2 sets - 10 reps - Shoulder W - External Rotation with Resistance  - 1 x daily - 7 x  weekly - 2 sets - 10 reps - Standing Shoulder Horizontal Abduction with Resistance  - 1 x daily - 7 x weekly - 2 sets - 10 reps  ASSESSMENT:  CLINICAL IMPRESSION: Pt with decreasing pain. Continued TPDN to address muscle spasm. Worked on postural strengthening and stability. Discussed gym work outs that are appropriate to restart performing.   EVAL: Patient is a 52 y.o. F who was seen today for physical therapy evaluation and treatment for thoracic and cervical pain. Assessment significant for multiple muscles in spasm including UTs, suboccipitals, and periscapular muscles likely related to her headaches and dizziness. Pt demos decreased cervical ROM with R>L rotator cuff weakness and decreased postural stability. Pt would highly benefit from PT to address these issues to improve pain for work and home tasks. Performed trial of TPDN this session to see if it will decrease pt's pain.  OBJECTIVE IMPAIRMENTS:  multiple muscles in spasm including UTs, suboccipitals, and periscapular muscles likely related to her headaches and dizziness. Pt demos decreased cervical ROM with R>L rotator cuff weakness and decreased postural stability decreased activity tolerance, decreased endurance, decreased mobility, decreased ROM, decreased strength, increased fascial restrictions, increased muscle spasms, impaired UE functional use, postural dysfunction, and pain.    GOALS: Goals reviewed with patient? Yes  SHORT TERM GOALS: Target date: 06/26/2023   Pt will be ind with initial HEP Baseline:  Goal status: INITIAL  2.  Pt will report >/=50% improvement in headaches Baseline:  Goal status: INITIAL  3.  Pt will be ind with maintaining postural stability for working at her desk Baseline:  Goal status: INITIAL    LONG TERM GOALS: Target date: 07/24/2023  Pt will be ind with management and progression of HEP Baseline:  Goal status: INITIAL  2.  Pt will be able to demo at least 4/5 periscapular muscles for  improved postural stability Baseline:  Goal status: INITIAL  3.  Pt will report >/=75% improvement in pain Baseline:  Goal status: INITIAL  4.  Pt will have increased FOTO score to >/=69 Baseline:  Goal status: INITIAL    PLAN:  PT FREQUENCY: 2x/week  PT DURATION: 8 weeks  PLANNED INTERVENTIONS: Therapeutic exercises, Therapeutic activity, Neuromuscular re-education, Balance training, Gait training, Patient/Family education, Self Care, Joint mobilization, Vestibular training, Aquatic Therapy, Dry Needling, Electrical stimulation, Spinal mobilization, Cryotherapy, Moist heat, Taping, Traction, Ionotophoresis 4mg /ml Dexamethasone, Manual therapy, and Re-evaluation  PLAN FOR NEXT SESSION: Initiate HEP for midback strengthening and neck stretching. Manual work/TPDN as indicated.    Audryna Wendt Keary Ma L Kadijah Shamoon, PT 06/03/2023, 8:00 AM

## 2023-06-08 ENCOUNTER — Encounter: Payer: Self-pay | Admitting: Rehabilitative and Restorative Service Providers"

## 2023-06-08 ENCOUNTER — Ambulatory Visit: Payer: Managed Care, Other (non HMO) | Admitting: Rehabilitative and Restorative Service Providers"

## 2023-06-08 ENCOUNTER — Other Ambulatory Visit: Payer: Self-pay | Admitting: Physician Assistant

## 2023-06-08 DIAGNOSIS — M542 Cervicalgia: Secondary | ICD-10-CM | POA: Diagnosis not present

## 2023-06-08 DIAGNOSIS — M6281 Muscle weakness (generalized): Secondary | ICD-10-CM

## 2023-06-08 DIAGNOSIS — R293 Abnormal posture: Secondary | ICD-10-CM

## 2023-06-08 DIAGNOSIS — F419 Anxiety disorder, unspecified: Secondary | ICD-10-CM

## 2023-06-08 DIAGNOSIS — M546 Pain in thoracic spine: Secondary | ICD-10-CM

## 2023-06-08 NOTE — Therapy (Signed)
OUTPATIENT PHYSICAL THERAPY CERVICAL TREATMENT   Patient Name: Anna Robinson MRN: [id number] DOB:1971-11-29, 52 y.o., female Today's Date: 06/08/2023  END OF SESSION:  PT End of Session - 06/08/23 0846     Visit Number 4    Number of Visits 12    Authorization Type [employer]    PT Start Time 0845    PT Stop Time 0933    PT Time Calculation (min) 48 min    Activity Tolerance Patient tolerated treatment well              Past Medical History:  Diagnosis Date   Allergy    Anxiety    Constipation    GERD (gastroesophageal reflux disease)    IBS (irritable bowel syndrome)    PONV (postoperative nausea and vomiting)    Seasonal allergies    Past Surgical History:  Procedure Laterality Date   COLONOSCOPY     ~10 yrs- normal per pt    TOTAL ABDOMINAL HYSTERECTOMY     WISDOM TOOTH EXTRACTION     age [age]   Patient Active Problem List   Diagnosis Date Noted   Cervical neck pain with evidence of disc disease 05/06/2023   Migraine without status migrainosus, not intractable 04/13/2023   Nausea 04/13/2023   Muscle spasms of neck 04/13/2023   Post-menopausal 04/10/2023   DDD (degenerative disc disease), lumbar 12/01/2022   DDD (degenerative disc disease), 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   Mouth sores 12/01/2022   Lower abdominal pain 12/01/2022   Acute bilateral low back pain without sciatica 12/01/2022   Slow transit constipation 12/01/2022   Anxiety 05/30/2022   Chest pain 02/24/2022   Overweight (BMI 25.0-29.9) 02/24/2022   Gastroesophageal reflux disease without esophagitis 02/24/2022   Sinus arrhythmia seen on electrocardiogram 02/24/2022   Sinus bradycardia 02/24/2022   Class 1 obesity due to excess calories without serious comorbidity with body mass index (BMI) of 31.0 to 31.9 in adult 02/19/2021   Fibrocystic changes of left breast 05/15/2020   Class 1 obesity due to excess calories without serious comorbidity with body mass index (BMI) of 30.0 to 30.9 in adult  02/01/2020   Right ovarian cyst 06/02/2019   Elevated liver enzymes 02/14/2019   Vitamin D insufficiency 01/26/2019   Elevated fasting glucose 01/26/2019   Dyslipidemia 01/26/2019   Epigastric pain 02/15/2018   Fatigue 01/15/2018   Muscle cramps 03/02/2015    PCP: Jomarie Longs, [name]  REFERRING PROVIDER: Jomarie Longs, [name]  REFERRING DIAG: [phone] (ICD-10-CM) - Muscle spasms of neck M50.90 (ICD-10-CM) - Cervical neck pain with evidence of disc disease R51.9 (ICD-10-CM) - Frequent headaches  THERAPY DIAG:  Cervicalgia  Pain in thoracic spine  Muscle weakness (generalized)  Abnormal posture  Rationale for Evaluation and Treatment: Rehabilitation  ONSET DATE: January 2024  SUBJECTIVE:  SUBJECTIVE STATEMENT: Pt states she feels improving. She was able to do pilates this weekend. She was seen by chiropractor this weekeend and he was able to "crack" her R side - has only been able to "crack" the L in the past. The needling is doing "fabulous" she has been more active with less pain. She has not had to take extra migraine medication. No headache.  Hand dominance: Right  PERTINENT HISTORY:  N/a  PAIN:  Are you having pain? Yes: NPRS scale: currently 5/10; at worst 10/10 Pain location: now feeling pain and tightness in the shoulder blade area  Pain description: tightness; throbbing; aching R side constant  Aggravating factors: not sure  Relieving factors: medication, chiropractor, massage, dry needling   PRECAUTIONS: None  WEIGHT BEARING RESTRICTIONS: No  FALLS:  Has patient fallen in last 6 months? 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 No large vessel occlusion or proximal hemodynamically significant stenosis. 2. No aneurysm identified.  IMPRESSION: Normal MRI of the brain.   PATIENT SURVEYS:  FOTO 58; predicted 69   POSTURE: rounded shoulders and forward head  PALPATION: TTP R periscapular muscles, thoracic paraspinals, bilat UTs, bilat cervical paraspinals and suboccipitals   CERVICAL ROM:   Active ROM A/PROM (deg) eval  Flexion 30  Extension 45  Right lateral flexion 39  Left lateral flexion 30*  Right rotation 44  Left rotation 52   (Blank rows = not tested)  UPPER EXTREMITY ROM:  Active ROM Right eval Left eval  Shoulder flexion WFL pulls into mid back [facility]  Shoulder extension    Shoulder abduction WFL pulls into mid back [facility]  Shoulder adduction    Shoulder extension    Shoulder internal rotation T10* T4  Shoulder external rotation T2 T2  Elbow flexion    Elbow extension    Wrist flexion    Wrist extension    Wrist ulnar deviation    Wrist radial deviation    Wrist pronation    Wrist supination     (Blank rows = not tested) * pain  UPPER EXTREMITY MMT:  MMT Right eval Left eval  Shoulder flexion 3+ 3+  Shoulder extension 4 5  Shoulder abduction 3+ 3+  Shoulder adduction    Shoulder internal rotation 3 5  Shoulder external rotation 3+ 5  Middle trapezius    Lower trapezius    Elbow flexion    Elbow extension    Wrist flexion    Wrist extension    Wrist ulnar deviation    Wrist radial deviation    Wrist pronation    Wrist supination    Grip strength     (Blank rows = not tested)  CERVICAL SPECIAL TESTS:  Did not assess  FUNCTIONAL TESTS:  Did not assess  TODAY'S TREATMENT:  [facility] Adult PT Treatment:                                                DATE: 06/08/23 Therapeutic Exercise: UBE L4; 4 min alt fwd/bwd UT  stretch x 30 sec Levator scap stretch x 30 sec Doorway pec stretch x 30 sec low, mid, high Shoulder ER red TB 3 sec x 10 "W" red TB 3 sec x 10 Horizontal shoulder abd red TB 2x10 Row blue TB x10 10 Shoulder ext blue TB x10  Chin tuck with axial extension, scap squeeze prone 5 sec x 5   Manual Therapy: STM & TPR UT, cervical/thoracic paraspinals Skilled assessment and palpation for TPDN Trigger Point Dry-Needling  Treatment instructions: Expect mild to moderate muscle soreness. S/S of pneumothorax if dry needled over a lung field, and to seek immediate medical attention should they occur. Patient verbalized understanding of these instructions and education. Patient Consent Given: Yes Education handout provided: Previously provided Muscles treated: bilat UT, bilat cervical paraspinals, bilat thoracic paraspinals, suboccipitals, rhomboids Electrical stimulation performed: No Parameters: N/A Treatment response/outcome: Twitch response ilicited, increased muscle length   [facility] Adult PT Treatment:                                                DATE: 06/03/23 Therapeutic Exercise: UBE L1; 2 min fwd, 2 min bwd UT stretch x 30 sec Levator scap stretch x 30 sec Doorway pec stretch x 30 sec low, mid, high Shoulder ER red TB 2x10 "W" red TB 2x10 Horizontal shoulder abd red TB 2x10 Row at cables x10 10# Shoulder ext at cables x10 10# Manual Therapy: STM & TPR UT, cervical/thoracic paraspinals Skilled assessment and palpation for TPDN Trigger Point Dry-Needling  Treatment instructions: Expect mild to moderate muscle soreness. S/S of pneumothorax if dry needled over a lung field, and to seek immediate medical attention should they occur. Patient verbalized understanding of these instructions and education.  Patient Consent Given: Yes Education handout provided: Previously provided Muscles treated: R UT, bilat cervical paraspinals, R thoracic paraspinals, suboccipitals, rhomboids Electrical  stimulation performed: No Parameters: N/A Treatment response/outcome: Twitch response ilicited, increased muscle length   PATIENT EDUCATION:  Education details: TPDN, exam findings, POC, initial HEP Person educated: Patient Education method: Explanation, Demonstration, and Handouts Education comprehension: verbalized understanding, returned demonstration, and needs further education  HOME EXERCISE PROGRAM:  Access Code: [email or web address] URL: [email or web address] Date: 06/08/2023 Prepared by: Corlis Leak  Exercises - Seated Cervical Retraction  - 2 x daily - 7 x weekly - 1-2 sets - 5-10 reps - 10 sec  hold - Seated Scapular Retraction  - 2 x daily - 7 x weekly - 1-2 sets - 10 reps - 10 sec  hold - Standing Backward Shoulder Rolls  - 2 x daily - 7 x weekly - 1 sets - 10 reps - 1-2 sec  hold - Standing Cervical Retraction with Sidebending  - 2 x daily - 7 x weekly - 1 sets - 5-6 reps - 10-15 sec  hold - Supine Diaphragmatic Breathing  - 2 x daily - 7 x weekly - 1 sets - 10 reps - 4-6 sec  hold - Supine Lower Trunk Rotation  - 2 x daily -  7 x weekly - 1 sets - 3-5 reps - 20-30 sec  hold - Shoulder External Rotation and Scapular Retraction with Resistance  - 1 x daily - 7 x weekly - 2 sets - 10 reps - Shoulder W - External Rotation with Resistance  - 1 x daily - 7 x weekly - 2 sets - 10 reps - Standing Shoulder Horizontal Abduction with Resistance  - 1 x daily - 7 x weekly - 2 sets - 10 reps - Standing Bilateral Low Shoulder Row with Anchored Resistance  - 2 x daily - 7 x weekly - 1-3 sets - 10 reps - 2-3 sec  hold - Shoulder extension with resistance - Neutral  - 1 x daily - 7 x weekly - 1-2 sets - 10 reps - 3-5 sec  hold  ASSESSMENT:  CLINICAL IMPRESSION: Pt reports continued improvement with dry needling; exercises; chiropractic care. She has with decreasing pain and increasing physical activity tolerance. Continued TPDN to address muscle tightness. Continued work on postural  strengthening and stability.   EVAL: Patient is a 52 y.o. F who was seen today for physical therapy evaluation and treatment for thoracic and cervical pain. Assessment significant for multiple muscles in spasm including UTs, suboccipitals, and periscapular muscles likely related to her headaches and dizziness. Pt demos decreased cervical ROM with R>L rotator cuff weakness and decreased postural stability. Pt would highly benefit from PT to address these issues to improve pain for work and home tasks. Performed trial of TPDN this session to see if it will decrease pt's pain.  OBJECTIVE IMPAIRMENTS:  multiple muscles in spasm including UTs, suboccipitals, and periscapular muscles likely related to her headaches and dizziness. Pt demos decreased cervical ROM with R>L rotator cuff weakness and decreased postural stability decreased activity tolerance, decreased endurance, decreased mobility, decreased ROM, decreased strength, increased fascial restrictions, increased muscle spasms, impaired UE functional use, postural dysfunction, and pain.    GOALS: Goals reviewed with patient? Yes  SHORT TERM GOALS: Target date: 06/26/2023   Pt will be ind with initial HEP Baseline:  Goal status: INITIAL  2.  Pt will report >/=50% improvement in headaches Baseline:  Goal status: INITIAL  3.  Pt will be ind with maintaining postural stability for working at her desk Baseline:  Goal status: INITIAL    LONG TERM GOALS: Target date: 07/24/2023  Pt will be ind with management and progression of HEP Baseline:  Goal status: INITIAL  2.  Pt will be able to demo at least 4/5 periscapular muscles for improved postural stability Baseline:  Goal status: INITIAL  3.  Pt will report >/=75% improvement in pain Baseline:  Goal status: INITIAL  4.  Pt will have increased FOTO score to >/=69 Baseline:  Goal status: INITIAL    PLAN:  PT FREQUENCY: 2x/week  PT DURATION: 8 weeks  PLANNED INTERVENTIONS:  Therapeutic exercises, Therapeutic activity, Neuromuscular re-education, Balance training, Gait training, Patient/Family education, Self Care, Joint mobilization, Vestibular training, Aquatic Therapy, Dry Needling, Electrical stimulation, Spinal mobilization, Cryotherapy, Moist heat, Taping, Traction, Ionotophoresis 4mg /ml Dexamethasone, Manual therapy, and Re-evaluation  PLAN FOR NEXT SESSION: Initiate HEP for midback strengthening and neck stretching. Manual work/TPDN as indicated.    Val Riles, PT 06/08/2023, 8:46 AM

## 2023-06-10 ENCOUNTER — Ambulatory Visit: Payer: Managed Care, Other (non HMO) | Admitting: Rehabilitative and Restorative Service Providers"

## 2023-06-10 ENCOUNTER — Encounter: Payer: Self-pay | Admitting: Rehabilitative and Restorative Service Providers"

## 2023-06-10 DIAGNOSIS — M6281 Muscle weakness (generalized): Secondary | ICD-10-CM

## 2023-06-10 DIAGNOSIS — M546 Pain in thoracic spine: Secondary | ICD-10-CM

## 2023-06-10 DIAGNOSIS — M542 Cervicalgia: Secondary | ICD-10-CM | POA: Diagnosis not present

## 2023-06-10 DIAGNOSIS — R293 Abnormal posture: Secondary | ICD-10-CM

## 2023-06-10 NOTE — Therapy (Signed)
OUTPATIENT PHYSICAL THERAPY CERVICAL TREATMENT   Patient Name: Anna Robinson MRN: [id number] DOB:03-22-1971, 52 y.o., female Today's Date: 06/10/2023  END OF SESSION:  PT End of Session - 06/10/23 0930     Visit Number 5    Number of Visits 12    Date for PT Re-Evaluation 07/09/23    Authorization Type [employer]    PT Start Time 0930    PT Stop Time 1018    PT Time Calculation (min) 48 min    Activity Tolerance Patient tolerated treatment well              Past Medical History:  Diagnosis Date   Allergy    Anxiety    Constipation    GERD (gastroesophageal reflux disease)    IBS (irritable bowel syndrome)    PONV (postoperative nausea and vomiting)    Seasonal allergies    Past Surgical History:  Procedure Laterality Date   COLONOSCOPY     ~10 yrs- normal per pt    TOTAL ABDOMINAL HYSTERECTOMY     WISDOM TOOTH EXTRACTION     age [age]   Patient Active Problem List   Diagnosis Date Noted   Cervical neck pain with evidence of disc disease 05/06/2023   Migraine without status migrainosus, not intractable 04/13/2023   Nausea 04/13/2023   Muscle spasms of neck 04/13/2023   Post-menopausal 04/10/2023   DDD (degenerative disc disease), lumbar 12/01/2022   DDD (degenerative disc disease), 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   Mouth sores 12/01/2022   Lower abdominal pain 12/01/2022   Acute bilateral low back pain without sciatica 12/01/2022   Slow transit constipation 12/01/2022   Anxiety 05/30/2022   Chest pain 02/24/2022   Overweight (BMI 25.0-29.9) 02/24/2022   Gastroesophageal reflux disease without esophagitis 02/24/2022   Sinus arrhythmia seen on electrocardiogram 02/24/2022   Sinus bradycardia 02/24/2022   Class 1 obesity due to excess calories without serious comorbidity with body mass index (BMI) of 31.0 to 31.9 in adult 02/19/2021   Fibrocystic changes of left breast 05/15/2020   Class 1 obesity due to excess calories without serious comorbidity with body mass index  (BMI) of 30.0 to 30.9 in adult 02/01/2020   Right ovarian cyst 06/02/2019   Elevated liver enzymes 02/14/2019   Vitamin D insufficiency 01/26/2019   Elevated fasting glucose 01/26/2019   Dyslipidemia 01/26/2019   Epigastric pain 02/15/2018   Fatigue 01/15/2018   Muscle cramps 03/02/2015    PCP: Jomarie Longs, [name]  REFERRING PROVIDER: Jomarie Longs, [name]  REFERRING DIAG: [phone] (ICD-10-CM) - Muscle spasms of neck M50.90 (ICD-10-CM) - Cervical neck pain with evidence of disc disease R51.9 (ICD-10-CM) - Frequent headaches  THERAPY DIAG:  Cervicalgia  Pain in thoracic spine  Muscle weakness (generalized)  Abnormal posture  Rationale for Evaluation and Treatment: Rehabilitation  ONSET DATE: January 2024  SUBJECTIVE:  SUBJECTIVE STATEMENT: Pt states she is tired and sore from exercises and she started doing do pilates. She has cut back on the Topamax this week and she feels "a little off".     Hand dominance: Right  PERTINENT HISTORY:  N/a  PAIN:  Are you having pain? Yes: NPRS scale: currently 5/10; at worst 5/10 Pain location: now feeling pain and tightness in the shoulder blade area  Pain description: tightness; throbbing; aching R side constant  Aggravating factors: not sure  Relieving factors: medication, chiropractor, massage, dry needling   PRECAUTIONS: None  WEIGHT BEARING RESTRICTIONS: No  FALLS:  Has patient fallen in last 6 months? 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 No large vessel occlusion or proximal hemodynamically significant stenosis. 2. No aneurysm  identified.  IMPRESSION: Normal MRI of the brain.   PATIENT SURVEYS:  FOTO 58; predicted 69   POSTURE: rounded shoulders and forward head  PALPATION: TTP R periscapular muscles, thoracic paraspinals, bilat UTs, bilat cervical paraspinals and suboccipitals   CERVICAL ROM:   Active ROM A/PROM (deg) eval  Flexion 30  Extension 45  Right lateral flexion 39  Left lateral flexion 30*  Right rotation 44  Left rotation 52   (Blank rows = not tested)  UPPER EXTREMITY ROM:  Active ROM Right eval Left eval  Shoulder flexion WFL pulls into mid back [facility]  Shoulder extension    Shoulder abduction WFL pulls into mid back [facility]  Shoulder adduction    Shoulder extension    Shoulder internal rotation T10* T4  Shoulder external rotation T2 T2  Elbow flexion    Elbow extension    Wrist flexion    Wrist extension    Wrist ulnar deviation    Wrist radial deviation    Wrist pronation    Wrist supination     (Blank rows = not tested) * pain  UPPER EXTREMITY MMT:  MMT Right eval Left eval  Shoulder flexion 3+ 3+  Shoulder extension 4 5  Shoulder abduction 3+ 3+  Shoulder adduction    Shoulder internal rotation 3 5  Shoulder external rotation 3+ 5  Middle trapezius    Lower trapezius    Elbow flexion    Elbow extension    Wrist flexion    Wrist extension    Wrist ulnar deviation    Wrist radial deviation    Wrist pronation    Wrist supination    Grip strength     (Blank rows = not tested)  CERVICAL SPECIAL TESTS:  Did not assess  FUNCTIONAL TESTS:  Did not assess  TODAY'S TREATMENT:                                                                                                                              [facility] Adult  PT Treatment:                                                DATE: 06/10/23 Therapeutic Exercise: UBE L4; 4 min alt fwd/bwd UT stretch x 30 sec Levator scap stretch x 30 sec Doorway pec stretch x 30 sec low, mid, high Doorway biceps stretch bilat 30  sec x 2 Shoulder ER red TB 3 sec x 10 "W" red TB 3 sec x 10 Horizontal shoulder abd red TB 2x10 Row blue TB x10 10 Shoulder ext blue TB x10  Chin tuck with axial extension, scap squeeze prone 5 sec x 5   Manual Therapy: STM & TPR UT, cervical/thoracic paraspinals Skilled assessment and palpation for TPDN Trigger Point Dry-Needling  Treatment instructions: Expect mild to moderate muscle soreness. S/S of pneumothorax if dry needled over a lung field, and to seek immediate medical attention should they occur. Patient verbalized understanding of these instructions and education. Patient Consent Given: Yes Education handout provided: Previously provided Muscles treated: bilat UT, bilat cervical paraspinals, bilat thoracic paraspinals, suboccipitals, rhomboids Electrical stimulation performed: No Parameters: N/A Treatment response/outcome: Twitch response ilicited, increased muscle length  TODAY'S TREATMENT:                                                                                                                              [facility] Adult PT Treatment:                                                DATE: 06/08/23 Therapeutic Exercise: UBE L4; 4 min alt fwd/bwd UT stretch x 30 sec Levator scap stretch x 30 sec Doorway pec stretch x 30 sec low, mid, high Shoulder ER red TB 3 sec x 10 "W" red TB 3 sec x 10 Horizontal shoulder abd red TB 2x10 Row blue TB x10 10 Shoulder ext blue TB x10  Chin tuck with axial extension, scap squeeze prone 5 sec x 5   Manual Therapy: STM & TPR UT, cervical/thoracic paraspinals Skilled assessment and palpation for TPDN Trigger Point Dry-Needling  Treatment instructions: Expect mild to moderate muscle soreness. S/S of pneumothorax if dry needled over a lung field, and to seek immediate medical attention should they occur. Patient verbalized understanding of these instructions and education. Patient Consent Given: Yes Education handout provided: Previously  provided Muscles treated: bilat UT, bilat cervical paraspinals, bilat thoracic paraspinals, suboccipitals, rhomboids Electrical stimulation performed: No Parameters: N/A Treatment response/outcome: Twitch response ilicited, increased muscle length   PATIENT EDUCATION:  Education details: TPDN, exam findings, POC, initial HEP Person educated: Patient Education method: Explanation, Demonstration, and Handouts Education comprehension: verbalized understanding, returned demonstration, and needs further education  HOME  EXERCISE PROGRAM:  Access Code: [email or web address] URL: [email or web address] Date: 06/08/2023 Prepared by: Corlis Leak  Exercises - Seated Cervical Retraction  - 2 x daily - 7 x weekly - 1-2 sets - 5-10 reps - 10 sec  hold - Seated Scapular Retraction  - 2 x daily - 7 x weekly - 1-2 sets - 10 reps - 10 sec  hold - Standing Backward Shoulder Rolls  - 2 x daily - 7 x weekly - 1 sets - 10 reps - 1-2 sec  hold - Standing Cervical Retraction with Sidebending  - 2 x daily - 7 x weekly - 1 sets - 5-6 reps - 10-15 sec  hold - Supine Diaphragmatic Breathing  - 2 x daily - 7 x weekly - 1 sets - 10 reps - 4-6 sec  hold - Supine Lower Trunk Rotation  - 2 x daily - 7 x weekly - 1 sets - 3-5 reps - 20-30 sec  hold - Shoulder External Rotation and Scapular Retraction with Resistance  - 1 x daily - 7 x weekly - 2 sets - 10 reps - Shoulder W - External Rotation with Resistance  - 1 x daily - 7 x weekly - 2 sets - 10 reps - Standing Shoulder Horizontal Abduction with Resistance  - 1 x daily - 7 x weekly - 2 sets - 10 reps - Standing Bilateral Low Shoulder Row with Anchored Resistance  - 2 x daily - 7 x weekly - 1-3 sets - 10 reps - 2-3 sec  hold - Shoulder extension with resistance - Neutral  - 1 x daily - 7 x weekly - 1-2 sets - 10 reps - 3-5 sec  hold  ASSESSMENT:  CLINICAL IMPRESSION: Pt reports continued improvement with dry needling; exercises; chiropractic care. She has  decreasing pain and increasing physical activity tolerance. Continued TPDN to address muscle tightness. Continued work on postural strengthening and stability as patient tolerates. No new exercises today due to soreness and tightness from current exercise program. Will progress with strengthening exercises at next visit.    EVAL: Patient is a 52 y.o. F who was seen today for physical therapy evaluation and treatment for thoracic and cervical pain. Assessment significant for multiple muscles in spasm including UTs, suboccipitals, and periscapular muscles likely related to her headaches and dizziness. Pt demos decreased cervical ROM with R>L rotator cuff weakness and decreased postural stability. Pt would highly benefit from PT to address these issues to improve pain for work and home tasks. Performed trial of TPDN this session to see if it will decrease pt's pain.  OBJECTIVE IMPAIRMENTS:  multiple muscles in spasm including UTs, suboccipitals, and periscapular muscles likely related to her headaches and dizziness. Pt demos decreased cervical ROM with R>L rotator cuff weakness and decreased postural stability decreased activity tolerance, decreased endurance, decreased mobility, decreased ROM, decreased strength, increased fascial restrictions, increased muscle spasms, impaired UE functional use, postural dysfunction, and pain.    GOALS: Goals reviewed with patient? Yes  SHORT TERM GOALS: Target date: 06/26/2023   Pt will be ind with initial HEP Baseline:  Goal status: INITIAL  2.  Pt will report >/=50% improvement in headaches Baseline:  Goal status: INITIAL  3.  Pt will be ind with maintaining postural stability for working at her desk Baseline:  Goal status: INITIAL    LONG TERM GOALS: Target date: 07/24/2023  Pt will be ind with management and progression of HEP Baseline:  Goal status: INITIAL  2.  Pt will be able  to demo at least 4/5 periscapular muscles for improved postural  stability Baseline:  Goal status: INITIAL  3.  Pt will report >/=75% improvement in pain Baseline:  Goal status: INITIAL  4.  Pt will have increased FOTO score to >/=69 Baseline:  Goal status: INITIAL    PLAN:  PT FREQUENCY: 2x/week  PT DURATION: 8 weeks  PLANNED INTERVENTIONS: Therapeutic exercises, Therapeutic activity, Neuromuscular re-education, Balance training, Gait training, Patient/Family education, Self Care, Joint mobilization, Vestibular training, Aquatic Therapy, Dry Needling, Electrical stimulation, Spinal mobilization, Cryotherapy, Moist heat, Taping, Traction, Ionotophoresis 4mg /ml Dexamethasone, Manual therapy, and Re-evaluation  PLAN FOR NEXT SESSION: Initiate HEP for midback strengthening and neck stretching. Manual work/TPDN as indicated.    Val Riles, PT 06/10/2023, 9:31 AM

## 2023-06-15 ENCOUNTER — Encounter: Payer: Self-pay | Admitting: Rehabilitative and Restorative Service Providers"

## 2023-06-15 ENCOUNTER — Ambulatory Visit
Payer: Managed Care, Other (non HMO) | Attending: Physician Assistant | Admitting: Rehabilitative and Restorative Service Providers"

## 2023-06-15 DIAGNOSIS — M542 Cervicalgia: Secondary | ICD-10-CM | POA: Diagnosis present

## 2023-06-15 DIAGNOSIS — M6281 Muscle weakness (generalized): Secondary | ICD-10-CM | POA: Diagnosis present

## 2023-06-15 DIAGNOSIS — M546 Pain in thoracic spine: Secondary | ICD-10-CM | POA: Diagnosis present

## 2023-06-15 DIAGNOSIS — R293 Abnormal posture: Secondary | ICD-10-CM | POA: Insufficient documentation

## 2023-06-15 NOTE — Therapy (Signed)
OUTPATIENT PHYSICAL THERAPY CERVICAL TREATMENT   Patient Name: Anna Robinson MRN: [id number] DOB:09-16-1971, 52 y.o., female Today's Date: 06/15/2023  END OF SESSION:  PT End of Session - 06/15/23 0849     Visit Number 6    Number of Visits 12    Date for PT Re-Evaluation 07/09/23    Authorization Type [employer]    PT Start Time [phone]    PT Stop Time 0935    PT Time Calculation (min) 48 min    Activity Tolerance Patient tolerated treatment well              Past Medical History:  Diagnosis Date   Allergy    Anxiety    Constipation    GERD (gastroesophageal reflux disease)    IBS (irritable bowel syndrome)    PONV (postoperative nausea and vomiting)    Seasonal allergies    Past Surgical History:  Procedure Laterality Date   COLONOSCOPY     ~10 yrs- normal per pt    TOTAL ABDOMINAL HYSTERECTOMY     WISDOM TOOTH EXTRACTION     age [age]   Patient Active Problem List   Diagnosis Date Noted   Cervical neck pain with evidence of disc disease 05/06/2023   Migraine without status migrainosus, not intractable 04/13/2023   Nausea 04/13/2023   Muscle spasms of neck 04/13/2023   Post-menopausal 04/10/2023   DDD (degenerative disc disease), lumbar 12/01/2022   DDD (degenerative disc disease), 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   Mouth sores 12/01/2022   Lower abdominal pain 12/01/2022   Acute bilateral low back pain without sciatica 12/01/2022   Slow transit constipation 12/01/2022   Anxiety 05/30/2022   Chest pain 02/24/2022   Overweight (BMI 25.0-29.9) 02/24/2022   Gastroesophageal reflux disease without esophagitis 02/24/2022   Sinus arrhythmia seen on electrocardiogram 02/24/2022   Sinus bradycardia 02/24/2022   Class 1 obesity due to excess calories without serious comorbidity with body mass index (BMI) of 31.0 to 31.9 in adult 02/19/2021   Fibrocystic changes of left breast 05/15/2020   Class 1 obesity due to excess calories without serious comorbidity with body mass index  (BMI) of 30.0 to 30.9 in adult 02/01/2020   Right ovarian cyst 06/02/2019   Elevated liver enzymes 02/14/2019   Vitamin D insufficiency 01/26/2019   Elevated fasting glucose 01/26/2019   Dyslipidemia 01/26/2019   Epigastric pain 02/15/2018   Fatigue 01/15/2018   Muscle cramps 03/02/2015    PCP: Jomarie Longs, [name]  REFERRING PROVIDER: Jomarie Longs, [name]  REFERRING DIAG: [phone] (ICD-10-CM) - Muscle spasms of neck M50.90 (ICD-10-CM) - Cervical neck pain with evidence of disc disease R51.9 (ICD-10-CM) - Frequent headaches  THERAPY DIAG:  Cervicalgia  Pain in thoracic spine  Muscle weakness (generalized)  Abnormal posture  Rationale for Evaluation and Treatment: Rehabilitation  ONSET DATE: January 2024  SUBJECTIVE:  SUBJECTIVE STATEMENT: Pt states she had a migraine HA this weekend. 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 She took her Topamax with good improvement.      Hand dominance: Right  PERTINENT HISTORY:  N/a  PAIN:  Are you having pain? Yes: NPRS scale: currently 5-8/10; at worst 8/10 Pain location: now feeling pain and tightness in the shoulder blade area  Pain description: tightness; throbbing; aching R side constant  Aggravating factors: not sure  Relieving factors: medication, chiropractor, massage, dry needling   PRECAUTIONS: None  WEIGHT BEARING RESTRICTIONS: No  FALLS:  Has patient fallen in last 6 months? 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 No large vessel occlusion or proximal hemodynamically  significant stenosis. 2. No aneurysm identified.  IMPRESSION: Normal MRI of the brain.   PATIENT SURVEYS:  FOTO 58; predicted 69   POSTURE: rounded shoulders and forward head  PALPATION: TTP R periscapular muscles, thoracic paraspinals, bilat UTs, bilat cervical paraspinals and suboccipitals   CERVICAL ROM:   Active ROM A/PROM (deg) eval  Flexion 30  Extension 45  Right lateral flexion 39  Left lateral flexion 30*  Right rotation 44  Left rotation 52   (Blank rows = not tested)  UPPER EXTREMITY ROM:  Active ROM Right eval Left eval  Shoulder flexion WFL pulls into mid back [facility]  Shoulder extension    Shoulder abduction WFL pulls into mid back [facility]  Shoulder adduction    Shoulder extension    Shoulder internal rotation T10* T4  Shoulder external rotation T2 T2  Elbow flexion    Elbow extension    Wrist flexion    Wrist extension    Wrist ulnar deviation    Wrist radial deviation    Wrist pronation    Wrist supination     (Blank rows = not tested) * pain  UPPER EXTREMITY MMT:  MMT Right eval Left eval  Shoulder flexion 3+ 3+  Shoulder extension 4 5  Shoulder abduction 3+ 3+  Shoulder adduction    Shoulder internal rotation 3 5  Shoulder external rotation 3+ 5  Middle trapezius    Lower trapezius    Elbow flexion    Elbow extension    Wrist flexion    Wrist extension    Wrist ulnar deviation    Wrist radial deviation    Wrist pronation    Wrist supination    Grip strength     (Blank rows = not tested)  CERVICAL SPECIAL TESTS:  Did not assess  FUNCTIONAL TESTS:  Did not assess  TODAY'S TREATMENT:  [facility] Adult PT Treatment:                                                DATE: 06/15/23 Therapeutic Exercise: UBE L4; 4 min alt fwd/bwd UT stretch x 30 sec Levator scap stretch x 30 sec Doorway pec stretch x 30 sec low, mid,  high Doorway biceps stretch bilat 30 sec x 2 Shoulder ER red TB 3 sec x 10 "W" red TB 3 sec x 10 Horizontal shoulder abd red TB 2x10 Row blue TB x10 10 Shoulder ext blue TB x10  Chin tuck with axial extension, scap squeeze prone 5 sec x 5   Manual Therapy: STM & TPR UT, cervical/thoracic paraspinals Skilled assessment and palpation for TPDN Trigger Point Dry-Needling  Treatment instructions: Expect mild to moderate muscle soreness. S/S of pneumothorax if dry needled over a lung field, and to seek immediate medical attention should they occur. Patient verbalized understanding of these instructions and education. Patient Consent Given: Yes Education handout provided: Previously provided Muscles treated: bilat UT, bilat cervical paraspinals, bilat thoracic paraspinals, suboccipitals, rhomboids Electrical stimulation performed: No Parameters: N/A Treatment response/outcome: Twitch response ilicited, increased muscle length  [facility] Adult PT Treatment:                                                DATE: 06/10/23 Therapeutic Exercise: UBE L4; 4 min alt fwd/bwd UT stretch x 30 sec Levator scap stretch x 30 sec Doorway pec stretch x 30 sec low, mid, high Doorway biceps stretch bilat 30 sec x 2 Shoulder ER red TB 3 sec x 10 "W" red TB 3 sec x 10 Horizontal shoulder abd red TB 2x10 Row blue TB x10 10 Shoulder ext blue TB x10  Chin tuck with axial extension, scap squeeze prone 5 sec x 5   Manual Therapy: STM & TPR UT, cervical/thoracic paraspinals Skilled assessment and palpation for TPDN Trigger Point Dry-Needling  Treatment instructions: Expect mild to moderate muscle soreness. S/S of pneumothorax if dry needled over a lung field, and to seek immediate medical attention should they occur. Patient verbalized understanding of these instructions and education. Patient Consent Given: Yes Education handout provided: Previously provided Muscles treated: bilat UT, bilat cervical paraspinals,  bilat thoracic paraspinals, suboccipitals, rhomboids Electrical stimulation performed: No Parameters: N/A Treatment response/outcome: Twitch response ilicited, increased muscle length   PATIENT EDUCATION:  Education details: TPDN, exam findings, POC, initial HEP Person educated: Patient Education method: Explanation, Demonstration, and Handouts Education comprehension: verbalized understanding, returned demonstration, and needs further education  HOME EXERCISE PROGRAM:  Access Code: [email or web address] URL: [email or web address] Date: 06/08/2023 Prepared by: Corlis Leak  Exercises - Seated Cervical Retraction  - 2 x daily - 7 x weekly - 1-2 sets - 5-10 reps - 10 sec  hold - Seated Scapular Retraction  - 2 x daily - 7 x weekly - 1-2 sets - 10 reps - 10 sec  hold - Standing Backward Shoulder Rolls  - 2 x daily - 7 x weekly - 1 sets - 10 reps - 1-2 sec  hold - Standing Cervical Retraction with Sidebending  - 2 x daily - 7 x weekly - 1 sets - 5-6 reps - 10-15 sec  hold - Supine  Diaphragmatic Breathing  - 2 x daily - 7 x weekly - 1 sets - 10 reps - 4-6 sec  hold - Supine Lower Trunk Rotation  - 2 x daily - 7 x weekly - 1 sets - 3-5 reps - 20-30 sec  hold - Shoulder External Rotation and Scapular Retraction with Resistance  - 1 x daily - 7 x weekly - 2 sets - 10 reps - Shoulder W - External Rotation with Resistance  - 1 x daily - 7 x weekly - 2 sets - 10 reps - Standing Shoulder Horizontal Abduction with Resistance  - 1 x daily - 7 x weekly - 2 sets - 10 reps - Standing Bilateral Low Shoulder Row with Anchored Resistance  - 2 x daily - 7 x weekly - 1-3 sets - 10 reps - 2-3 sec  hold - Shoulder extension with resistance - Neutral  - 1 x daily - 7 x weekly - 1-2 sets - 10 reps - 3-5 sec  hold  ASSESSMENT:  CLINICAL IMPRESSION: Pt reports flare up of pain in the R neck area since Friday. Several factors possibly contributed to flare up. Good improvement with dry needling and exercises.  She has decreasing pain and increasing physical activity tolerance. Continued TPDN to address muscle tightness. Continued work on postural strengthening and stability as patient tolerates. Will progress with strengthening exercises as patient tolerates.    EVAL: Patient is a 52 y.o. F who was seen today for physical therapy evaluation and treatment for thoracic and cervical pain. Assessment significant for multiple muscles in spasm including UTs, suboccipitals, and periscapular muscles likely related to her headaches and dizziness. Pt demos decreased cervical ROM with R>L rotator cuff weakness and decreased postural stability. Pt would highly benefit from PT to address these issues to improve pain for work and home tasks. Performed trial of TPDN this session to see if it will decrease pt's pain.  OBJECTIVE IMPAIRMENTS:  multiple muscles in spasm including UTs, suboccipitals, and periscapular muscles likely related to her headaches and dizziness. Pt demos decreased cervical ROM with R>L rotator cuff weakness and decreased postural stability decreased activity tolerance, decreased endurance, decreased mobility, decreased ROM, decreased strength, increased fascial restrictions, increased muscle spasms, impaired UE functional use, postural dysfunction, and pain.    GOALS: Goals reviewed with patient? Yes  SHORT TERM GOALS: Target date: 06/26/2023   Pt will be ind with initial HEP Baseline:  Goal status: INITIAL  2.  Pt will report >/=50% improvement in headaches Baseline:  Goal status: INITIAL  3.  Pt will be ind with maintaining postural stability for working at her desk Baseline:  Goal status: INITIAL    LONG TERM GOALS: Target date: 07/24/2023  Pt will be ind with management and progression of HEP Baseline:  Goal status: INITIAL  2.  Pt will be able to demo at least 4/5 periscapular muscles for improved postural stability Baseline:  Goal status: INITIAL  3.  Pt will report >/=75%  improvement in pain Baseline:  Goal status: INITIAL  4.  Pt will have increased FOTO score to >/=69 Baseline:  Goal status: INITIAL    PLAN:  PT FREQUENCY: 2x/week  PT DURATION: 8 weeks  PLANNED INTERVENTIONS: Therapeutic exercises, Therapeutic activity, Neuromuscular re-education, Balance training, Gait training, Patient/Family education, Self Care, Joint mobilization, Vestibular training, Aquatic Therapy, Dry Needling, Electrical stimulation, Spinal mobilization, Cryotherapy, Moist heat, Taping, Traction, Ionotophoresis 4mg /ml Dexamethasone, Manual therapy, and Re-evaluation  PLAN FOR NEXT SESSION: Initiate HEP for midback strengthening and  neck stretching. Manual work/TPDN as indicated.    Kenzley Ke Rober Minion, PT 06/15/2023, 8:50 AM

## 2023-06-20 ENCOUNTER — Ambulatory Visit
Admission: EM | Admit: 2023-06-20 | Discharge: 2023-06-20 | Disposition: A | Discharge status: Home / Self Care | Payer: Managed Care, Other (non HMO) | Attending: Family Medicine | Admitting: Family Medicine

## 2023-06-20 DIAGNOSIS — R233 Spontaneous ecchymoses: Secondary | ICD-10-CM | POA: Insufficient documentation

## 2023-06-20 DIAGNOSIS — I776 Arteritis, unspecified: Secondary | ICD-10-CM | POA: Diagnosis present

## 2023-06-20 NOTE — ED Provider Notes (Signed)
[name]    CSN: [id number] Arrival date & time: 06/20/23  1507      History   Chief Complaint Chief Complaint  Patient presents with   Rash    HPI Anna Robinson is a 52 y.o. female.   HPI  Patient and family went to [facility] for vacation.  On 4th of July noted a swelling, itching, prickly rash developing on her ankles.  NEVER had before. She has been on a number of new medications over the last 2 weeks and started new supplements She has not had any fever or illness   denies tick bite.  No j  joint pain.  Medical problem list and medication list reviewed  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   Mouth sores 12/01/2022   Lower abdominal pain 12/01/2022   Acute bilateral low back pain without sciatica 12/01/2022   Slow transit constipation 12/01/2022   Anxiety 05/30/2022   Chest pain 02/24/2022   Overweight (BMI 25.0-29.9) 02/24/2022   Gastroesophageal reflux disease without esophagitis 02/24/2022   Sinus arrhythmia seen on electrocardiogram 02/24/2022   Sinus bradycardia 02/24/2022   Class 1 obesity due to excess calories without serious comorbidity with body mass index (BMI) of 31.0 to 31.9 in adult 02/19/2021   Fibrocystic changes of left breast 05/15/2020   Class 1 obesity due to excess calories without serious comorbidity with body mass index (BMI) of 30.0 to 30.9 in adult 02/01/2020   Right ovarian  cyst 06/02/2019   Elevated liver enzymes 02/14/2019   Vitamin D insufficiency 01/26/2019   Elevated fasting glucose 01/26/2019   Dyslipidemia 01/26/2019   Epigastric pain 02/15/2018   Fatigue 01/15/2018   Muscle cramps 03/02/2015    Past Surgical History:  Procedure Laterality Date   COLONOSCOPY     ~10 yrs- normal per pt    TOTAL ABDOMINAL HYSTERECTOMY     WISDOM TOOTH EXTRACTION     age [age]    OB History     Gravida  3   Para  2   Term  2   Preterm      AB  1   Living         SAB      IAB  1   Ectopic      Multiple      Live Births               Home Medications    Prior to Admission medications   Medication Sig Start Date End Date Taking? Authorizing Provider  ALPRAZolam (XANAX) 0.25 MG tablet TAKE 1 TABLET BY MOUTH AT BEDTIME AS NEEDED FOR ANXIETY. 06/09/23   Breeback, Jade L, [name]  cyclobenzaprine (FLEXERIL) 5 MG tablet Take 1 tablet (5 mg total) by mouth 3 (three) times daily as needed for muscle spasms. 05/06/23   Jomarie Longs, [name]  diphenhydrAMINE (BENADRYL) 25 mg capsule Take 25 mg  by mouth every 6 (six) hours as needed.    [provider]  fexofenadine (ALLEGRA ALLERGY) 180 MG tablet Take 1 tablet (180 mg total) by mouth daily for 15 days. 01/02/22 05/27/22  Trevor Iha, FNP  linaclotide (LINZESS) 290 MCG CAPS capsule Take 1 capsule (290 mcg total) by mouth daily. 05/27/23   Breeback, Jade L, [name]  MAGNESIUM PO Take by mouth. Takes once a week    [provider]  meloxicam (MOBIC) 15 MG tablet Take 1 tablet (15 mg total) by mouth daily. 05/06/23   Breeback, Lonna Cobb, [name]  omeprazole (PRILOSEC) 40 MG capsule Take 1 capsule (40 mg total) by mouth daily. 05/27/23   Breeback, Jade L, [name]  ondansetron (ZOFRAN) 4 MG tablet Take 1 tablet (4 mg total) by mouth every 8 (eight) hours as needed for nausea or vomiting. 05/06/23   Breeback, Jade L, [name]  Rimegepant Sulfate (NURTEC) 75 MG TBDP Take 1 tablet (75 mg total) by mouth once as  needed for up to 1 dose (migraine). 04/20/23   Charlton Amor, DO  rizatriptan (MAXALT) 10 MG tablet Take 1 tablet (10 mg total) by mouth as needed for migraine. May repeat in 2 hours if needed 05/27/23   Tandy Gaw L, [name]  topiramate (TOPAMAX) 25 MG tablet Take 1 tablet (25 mg total) by mouth 2 (two) times daily. 05/27/23   Jomarie Longs, [name]    Family History Family History  Problem Relation Age of Onset   Heart disease Mother    Diabetes Mother    Hyperlipidemia Mother    Hypertension Mother    Stroke Mother    Breast cancer Mother    Cancer Father    Brain cancer Father    Lung cancer Father    Anuerysm Father    Depression Maternal Aunt    Heart disease Maternal Grandmother    Esophageal cancer Sister    Colon cancer Neg Hx    Colon polyps Neg Hx    Rectal cancer Neg Hx    Stomach cancer Neg Hx     Social History Social History   Tobacco Use   Smoking status: Never   Smokeless tobacco: Never  Substance Use Topics   Alcohol use: Yes    Alcohol/week: 0.0 standard drinks of alcohol    Comment: rare - not often per pt    Drug use: No     Allergies   Penicillins and Wellbutrin [bupropion]   Review of Systems Review of Systems See HPI  Physical Exam Triage Vital Signs ED Triage Vitals  Enc Vitals Group     BP 06/20/23 1538 105/72     Pulse Rate 06/20/23 1538 81     Resp 06/20/23 1538 20     Temp 06/20/23 1538 98.2 F (36.8 C)     Temp Source 06/20/23 1538 Oral     SpO2 06/20/23 1538 98 %     Weight --      Height --      Head Circumference --      Peak Flow --      Pain Score 06/20/23 1536 5     Pain Loc --      Pain Edu? --      Excl. in GC? --    No data found.  Updated Vital Signs BP 105/72 (BP Location: Right Arm)   Pulse 81   Temp 98.2 F (36.8 C) (Oral)   Resp 20   SpO2 98%  Physical Exam Constitutional:      General: She is not in acute distress.    Appearance: Normal appearance. She is well-developed and normal weight.   HENT:     Head: Normocephalic and atraumatic.  Eyes:     Conjunctiva/sclera: Conjunctivae normal.     Pupils: Pupils are equal, round, and reactive to light.  Cardiovascular:     Rate and Rhythm: Normal rate.  Pulmonary:     Effort: Pulmonary effort is normal. No respiratory distress.  Abdominal:     General: There is no distension.     Palpations: Abdomen is soft.  Musculoskeletal:        General: Normal range of motion.     Cervical back: Normal range of motion.  Skin:    General: Skin is warm and dry.     Findings: Rash present.     Comments: Petechiae do not blanch  Neurological:     General: No focal deficit present.     Mental Status: She is alert.     Gait: Gait normal.  Psychiatric:        Mood and Affect: Mood normal.      UC Treatments / Results  Labs (all labs ordered are listed, but only abnormal results are displayed) Labs Reviewed  CBC WITH DIFFERENTIAL/PLATELET  COMPREHENSIVE METABOLIC PANEL  URINALYSIS, W/ REFLEX TO CULTURE (INFECTION SUSPECTED)  SEDIMENTATION RATE    EKG   Radiology No results found.  Procedures Procedures (including critical care time)  Medications Ordered in UC Medications - No data to display  Initial Impression / Assessment and Plan / UC Course  I have reviewed the triage vital signs and the nursing notes.  Pertinent labs & imaging results that were available during my care of the patient were reviewed by me and considered in my medical decision making (see chart for details).     Discussed this is a presentation of a wide variety of conditions, and I cannot tell her with certainty what is going on.  Will check initial labs and have her followup  with her PCP Final Clinical Impressions(s) / UC Diagnoses   Final diagnoses:  Vasculitis (HCC)  Petechial rash     Discharge Instructions      Keep area cool Reduce activity Take benadryl at night 25050 mg Take claritin or zyrtec ( or generic) 2 tabs each  morning Call for lab results See Lesly Rubenstein next week   ED Prescriptions   None    PDMP not reviewed this encounter.   Eustace Moore, MD 06/20/23 [phone]

## 2023-06-20 NOTE — Discharge Instructions (Signed)
Keep area cool Reduce activity Take benadryl at night 25050 mg Take claritin or zyrtec ( or generic) 2 tabs each morning Call for lab results See Lesly Rubenstein next week

## 2023-06-20 NOTE — ED Triage Notes (Addendum)
Patient reports having a new onset of rash (itchiness, cramping to calf, and aching) to lower extremities that appeared on 06/18/23.  The patient states she just got back from [place] and had been doing a lot of walking. The only changes the patient states are, drinking a new type of tea, and new medication).    The area appears as petechiae in lower extremities.   Home interventions: benadryl (last taken last night)

## 2023-06-21 LAB — COMPREHENSIVE METABOLIC PANEL
ALT: 81 IU/L — ABNORMAL HIGH (ref 0–32)
AST: 42 IU/L — ABNORMAL HIGH (ref 0–40)
Albumin: 4.6 g/dL (ref 3.8–4.9)
Alkaline Phosphatase: 135 IU/L — ABNORMAL HIGH (ref 44–121)
BUN/Creatinine Ratio: 17 (ref 9–23)
BUN: 17 mg/dL (ref 6–24)
Bilirubin Total: 0.7 mg/dL (ref 0.0–1.2)
CO2: 22 mmol/L (ref 20–29)
Calcium: 9.3 mg/dL (ref 8.7–10.2)
Chloride: 106 mmol/L (ref 96–106)
Creatinine, Ser: 0.99 mg/dL (ref 0.57–1.00)
Globulin, Total: 1.9 g/dL (ref 1.5–4.5)
Glucose: 122 mg/dL — ABNORMAL HIGH (ref 70–99)
Potassium: 4.3 mmol/L (ref 3.5–5.2)
Sodium: 141 mmol/L (ref 134–144)
Total Protein: 6.5 g/dL (ref 6.0–8.5)
eGFR: 68 mL/min/{1.73_m2} (ref >59)

## 2023-06-21 LAB — CBC WITH DIFFERENTIAL/PLATELET
Basophils Absolute: 0 10*3/uL (ref 0.0–0.2)
Basos: 0 %
EOS (ABSOLUTE): 0.2 10*3/uL (ref 0.0–0.4)
Eos: 2 %
Hematocrit: 42 % (ref 34.0–46.6)
Hemoglobin: 13.9 g/dL (ref 11.1–15.9)
Immature Grans (Abs): 0 10*3/uL (ref 0.0–0.1)
Immature Granulocytes: 0 %
Lymphocytes Absolute: 3.4 10*3/uL — ABNORMAL HIGH (ref 0.7–3.1)
Lymphs: 38 %
MCH: 29.5 pg (ref 26.6–33.0)
MCHC: 33.1 g/dL (ref 31.5–35.7)
MCV: 89 fL (ref 79–97)
Monocytes Absolute: 0.6 10*3/uL (ref 0.1–0.9)
Monocytes: 7 %
Neutrophils Absolute: 4.7 10*3/uL (ref 1.4–7.0)
Neutrophils: 53 %
Platelets: 244 10*3/uL (ref 150–450)
RBC: 4.71 x10E6/uL (ref 3.77–5.28)
RDW: 13.8 % (ref 11.7–15.4)
WBC: 9 10*3/uL (ref 3.4–10.8)

## 2023-06-21 LAB — SEDIMENTATION RATE: Sed Rate: 7 mm/hr (ref 0–40)

## 2023-06-21 LAB — URINALYSIS, W/ REFLEX TO CULTURE (INFECTION SUSPECTED)
Bacteria, UA: NONE SEEN
Bilirubin Urine: NEGATIVE
Glucose, UA: NEGATIVE mg/dL
Hgb urine dipstick: NEGATIVE
Ketones, ur: NEGATIVE mg/dL
Leukocytes,Ua: NEGATIVE
Nitrite: NEGATIVE
Protein, ur: NEGATIVE mg/dL
Specific Gravity, Urine: 1.012 (ref 1.005–1.030)
pH: 7 (ref 5.0–8.0)

## 2023-06-23 ENCOUNTER — Ambulatory Visit: Payer: Managed Care, Other (non HMO) | Admitting: Physical Therapy

## 2023-06-23 ENCOUNTER — Encounter: Payer: Self-pay | Admitting: Physical Therapy

## 2023-06-23 DIAGNOSIS — M6281 Muscle weakness (generalized): Secondary | ICD-10-CM

## 2023-06-23 DIAGNOSIS — M542 Cervicalgia: Secondary | ICD-10-CM | POA: Diagnosis not present

## 2023-06-23 DIAGNOSIS — M546 Pain in thoracic spine: Secondary | ICD-10-CM

## 2023-06-23 DIAGNOSIS — R293 Abnormal posture: Secondary | ICD-10-CM

## 2023-06-23 NOTE — Therapy (Signed)
OUTPATIENT PHYSICAL THERAPY CERVICAL TREATMENT   Patient Name: Anna Robinson MRN: [id number] DOB:June 07, 1971, 52 y.o., female Today's Date: 06/23/2023  END OF SESSION:  PT End of Session - 06/23/23 1616     Visit Number 7    Number of Visits 12    Date for PT Re-Evaluation 07/09/23    Authorization Type [employer]    PT Start Time 1616    PT Stop Time 1700    PT Time Calculation (min) 44 min    Activity Tolerance Patient tolerated treatment well              Past Medical History:  Diagnosis Date   Allergy    Anxiety    Constipation    GERD (gastroesophageal reflux disease)    IBS (irritable bowel syndrome)    PONV (postoperative nausea and vomiting)    Seasonal allergies    Past Surgical History:  Procedure Laterality Date   COLONOSCOPY     ~10 yrs- normal per pt    TOTAL ABDOMINAL HYSTERECTOMY     WISDOM TOOTH EXTRACTION     age 32-17   Patient Active Problem List   Diagnosis Date Noted   Cervical neck pain with evidence of disc disease 05/06/2023   Migraine without status migrainosus, not intractable 04/13/2023   Nausea 04/13/2023   Muscle spasms of neck 04/13/2023   Post-menopausal 04/10/2023   DDD (degenerative disc disease), lumbar 12/01/2022   DDD (degenerative disc disease), 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   Mouth sores 12/01/2022   Lower abdominal pain 12/01/2022   Acute bilateral low back pain without sciatica 12/01/2022   Slow transit constipation 12/01/2022   Anxiety 05/30/2022   Chest pain 02/24/2022   Overweight (BMI 25.0-29.9) 02/24/2022   Gastroesophageal reflux disease without esophagitis 02/24/2022   Sinus arrhythmia seen on electrocardiogram 02/24/2022   Sinus bradycardia 02/24/2022   Class 1 obesity due to excess calories without serious comorbidity with body mass index (BMI) of 31.0 to 31.9 in adult 02/19/2021   Fibrocystic changes of left breast 05/15/2020   Class 1 obesity due to excess calories without serious comorbidity with body mass index  (BMI) of 30.0 to 30.9 in adult 02/01/2020   Right ovarian cyst 06/02/2019   Elevated liver enzymes 02/14/2019   Vitamin D insufficiency 01/26/2019   Elevated fasting glucose 01/26/2019   Dyslipidemia 01/26/2019   Epigastric pain 02/15/2018   Fatigue 01/15/2018   Muscle cramps 03/02/2015    PCP: Jomarie Longs, [name]  REFERRING PROVIDER: Jomarie Longs, [name]  REFERRING DIAG: [phone] (ICD-10-CM) - Muscle spasms of neck M50.90 (ICD-10-CM) - Cervical neck pain with evidence of disc disease R51.9 (ICD-10-CM) - Frequent headaches  THERAPY DIAG:  Cervicalgia  Pain in thoracic spine  Muscle weakness (generalized)  Abnormal posture  Rationale for Evaluation and Treatment: Rehabilitation  ONSET DATE: January 2024  SUBJECTIVE:  SUBJECTIVE STATEMENT: Pt states she went to [place]. Pt reports she walked a lot. Pt states her leg hurt and stung. This is improving now. Pt reports she did have a migraine yesterday. Pt states she is not feeling any pain but just some numbness in her fingers - not sure if it's due to her medication and everything that happened with her legs.  Hand dominance: Right  PERTINENT HISTORY:  N/a  PAIN:  Are you having pain? Yes: NPRS scale: currently 0/10; at worst 8/10 Pain location: now feeling pain and tightness in the shoulder blade area  Pain description: tightness; throbbing; aching R side constant  Aggravating factors: not sure  Relieving factors: medication, chiropractor, massage, dry needling   PRECAUTIONS: None  WEIGHT BEARING RESTRICTIONS: No  FALLS:  Has patient fallen in last 6 months? 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 No large vessel occlusion or proximal hemodynamically significant stenosis. 2. No aneurysm identified.  IMPRESSION: Normal MRI of the brain.   PATIENT SURVEYS:  FOTO 58; predicted 69   POSTURE: rounded shoulders and forward head  PALPATION: TTP R periscapular muscles, thoracic paraspinals, bilat UTs, bilat cervical paraspinals and suboccipitals   CERVICAL ROM:   Active ROM A/PROM (deg) eval  Flexion 30  Extension 45  Right lateral flexion 39  Left lateral flexion 30*  Right rotation 44  Left rotation 52   (Blank rows = not tested)  UPPER EXTREMITY ROM:  Active ROM Right eval Left eval  Shoulder flexion WFL pulls into mid back [facility]  Shoulder extension    Shoulder abduction WFL pulls into mid back [facility]  Shoulder adduction    Shoulder extension    Shoulder internal rotation T10* T4  Shoulder external rotation T2 T2  Elbow flexion    Elbow extension    Wrist flexion    Wrist extension    Wrist ulnar deviation    Wrist radial deviation    Wrist pronation    Wrist supination     (Blank rows = not tested) * pain  UPPER EXTREMITY MMT:  MMT Right eval Left eval  Shoulder flexion 3+ 3+  Shoulder extension 4 5  Shoulder abduction 3+ 3+  Shoulder adduction    Shoulder internal rotation 3 5  Shoulder external rotation 3+ 5  Middle trapezius    Lower trapezius    Elbow flexion    Elbow extension    Wrist flexion    Wrist extension    Wrist ulnar deviation    Wrist radial deviation    Wrist pronation    Wrist supination    Grip strength     (Blank rows = not tested)  CERVICAL SPECIAL TESTS:  Did not assess  FUNCTIONAL TESTS:  Did not assess  TODAY'S TREATMENT:     [facility] Adult PT Treatment:                                                DATE: 06/23/23 Therapeutic Exercise: UBE L4; 4 min alt fwd/bwd Doorway pec stretch low, mid, high x30 sec each Pball roll out x30 sec, lateral flexion x30 sec Row blue TB  2x10 Shoulder ext blue TB 2x10 Shoulder ER green TB  2x10 Shoulder IR red TB 2x10 Cervical retraction into coregeous ball with shoulder flexion x10, with shoulder abd x10 Manual Therapy: STM & TPR UT, cervical/thoracic paraspinals Skilled assessment and palpation for TPDN Trigger Point Dry-Needling  Treatment instructions: Expect mild to moderate muscle soreness. S/S of pneumothorax if dry needled over a lung field, and to seek immediate medical attention should they occur. Patient verbalized understanding of these instructions and education. Patient Consent Given: Yes Education handout provided: Previously provided Muscles treated: bilat UT, cervical paraspinals, L QL Electrical stimulation performed: No Parameters: N/A Treatment response/outcome: Twitch response ilicited, increased muscle length                                                                                                                            [facility] Adult PT Treatment:                                                DATE: 06/15/23 Therapeutic Exercise: UBE L4; 4 min alt fwd/bwd UT stretch x 30 sec Levator scap stretch x 30 sec Doorway pec stretch x 30 sec low, mid, high Doorway biceps stretch bilat 30 sec x 2 Shoulder ER red TB 3 sec x 10 "W" red TB 3 sec x 10 Horizontal shoulder abd red TB 2x10 Row blue TB x10 10 Shoulder ext blue TB x10  Chin tuck with axial extension, scap squeeze prone 5 sec x 5   Manual Therapy: STM & TPR UT, cervical/thoracic paraspinals Skilled assessment and palpation for TPDN Trigger Point Dry-Needling  Treatment instructions: Expect mild to moderate muscle soreness. S/S of pneumothorax if dry needled over a lung field, and to seek immediate medical attention should they occur. Patient verbalized understanding of these instructions and education. Patient Consent Given: Yes Education handout provided: Previously provided Muscles treated: bilat UT, bilat cervical paraspinals, bilat  thoracic paraspinals, suboccipitals, rhomboids Electrical stimulation performed: No Parameters: N/A Treatment response/outcome: Twitch response ilicited, increased muscle length  [facility] Adult PT Treatment:                                                DATE: 06/10/23 Therapeutic Exercise: UBE L4; 4 min alt fwd/bwd UT stretch x 30 sec Levator scap stretch x 30 sec Doorway pec stretch x 30 sec low, mid, high Doorway biceps stretch bilat 30 sec x 2 Shoulder ER red TB 3 sec x 10 "W" red TB 3 sec x 10 Horizontal shoulder abd red TB 2x10 Row blue TB x10 10 Shoulder ext blue TB x10  Chin tuck with axial extension, scap squeeze prone 5 sec x 5   Manual Therapy: STM & TPR UT, cervical/thoracic paraspinals Skilled assessment and palpation for TPDN  Trigger Point Dry-Needling  Treatment instructions: Expect mild to moderate muscle soreness. S/S of pneumothorax if dry needled over a lung field, and to seek immediate medical attention should they occur. Patient verbalized understanding of these instructions and education. Patient Consent Given: Yes Education handout provided: Previously provided Muscles treated: bilat UT, bilat cervical paraspinals, bilat thoracic paraspinals, suboccipitals, rhomboids Electrical stimulation performed: No Parameters: N/A Treatment response/outcome: Twitch response ilicited, increased muscle length   PATIENT EDUCATION:  Education details: TPDN, exam findings, POC, initial HEP Person educated: Patient Education method: Explanation, Demonstration, and Handouts Education comprehension: verbalized understanding, returned demonstration, and needs further education  HOME EXERCISE PROGRAM:  Access Code: [email or web address] URL: [email or web address] Date: 06/08/2023 Prepared by: Corlis Leak  Exercises - Seated Cervical Retraction  - 2 x daily - 7 x weekly - 1-2 sets - 5-10 reps - 10 sec  hold - Seated Scapular Retraction  - 2 x daily - 7 x weekly - 1-2 sets -  10 reps - 10 sec  hold - Standing Backward Shoulder Rolls  - 2 x daily - 7 x weekly - 1 sets - 10 reps - 1-2 sec  hold - Standing Cervical Retraction with Sidebending  - 2 x daily - 7 x weekly - 1 sets - 5-6 reps - 10-15 sec  hold - Supine Diaphragmatic Breathing  - 2 x daily - 7 x weekly - 1 sets - 10 reps - 4-6 sec  hold - Supine Lower Trunk Rotation  - 2 x daily - 7 x weekly - 1 sets - 3-5 reps - 20-30 sec  hold - Shoulder External Rotation and Scapular Retraction with Resistance  - 1 x daily - 7 x weekly - 2 sets - 10 reps - Shoulder W - External Rotation with Resistance  - 1 x daily - 7 x weekly - 2 sets - 10 reps - Standing Shoulder Horizontal Abduction with Resistance  - 1 x daily - 7 x weekly - 2 sets - 10 reps - Standing Bilateral Low Shoulder Row with Anchored Resistance  - 2 x daily - 7 x weekly - 1-3 sets - 10 reps - 2-3 sec  hold - Shoulder extension with resistance - Neutral  - 1 x daily - 7 x weekly - 1-2 sets - 10 reps - 3-5 sec  hold  ASSESSMENT:  CLINICAL IMPRESSION: Some continued UT tightness address with TPDN. Otherwise, her other neck muscles have had a lot less tension and trigger points. Continued to work on postural stabilization and shoulder strengthening today. Sharp pain noted with shoulder IR using green band on her R but able to tolerate red resistance band. She continues to progress well towards her goals.    EVAL: Patient is a 52 y.o. F who was seen today for physical therapy evaluation and treatment for thoracic and cervical pain. Assessment significant for multiple muscles in spasm including UTs, suboccipitals, and periscapular muscles likely related to her headaches and dizziness. Pt demos decreased cervical ROM with R>L rotator cuff weakness and decreased postural stability. Pt would highly benefit from PT to address these issues to improve pain for work and home tasks. Performed trial of TPDN this session to see if it will decrease pt's pain.  OBJECTIVE  IMPAIRMENTS:  multiple muscles in spasm including UTs, suboccipitals, and periscapular muscles likely related to her headaches and dizziness. Pt demos decreased cervical ROM with R>L rotator cuff weakness and decreased postural stability decreased activity tolerance, decreased endurance, decreased mobility, decreased ROM, decreased strength, increased  fascial restrictions, increased muscle spasms, impaired UE functional use, postural dysfunction, and pain.    GOALS: Goals reviewed with patient? Yes  SHORT TERM GOALS: Target date: 06/26/2023   Pt will be ind with initial HEP Baseline:  Goal status: MET  2.  Pt will report >/=50% improvement in headaches Baseline:  Goal status: INITIAL  3.  Pt will be ind with maintaining postural stability for working at her desk Baseline:  Goal status: MET    LONG TERM GOALS: Target date: 07/24/2023  Pt will be ind with management and progression of HEP Baseline:  Goal status: INITIAL  2.  Pt will be able to demo at least 4/5 periscapular muscles for improved postural stability Baseline:  Goal status: INITIAL  3.  Pt will report >/=75% improvement in pain Baseline:  Goal status: INITIAL  4.  Pt will have increased FOTO score to >/=69 Baseline:  Goal status: INITIAL    PLAN:  PT FREQUENCY: 2x/week  PT DURATION: 8 weeks  PLANNED INTERVENTIONS: Therapeutic exercises, Therapeutic activity, Neuromuscular re-education, Balance training, Gait training, Patient/Family education, Self Care, Joint mobilization, Vestibular training, Aquatic Therapy, Dry Needling, Electrical stimulation, Spinal mobilization, Cryotherapy, Moist heat, Taping, Traction, Ionotophoresis 4mg /ml Dexamethasone, Manual therapy, and Re-evaluation  PLAN FOR NEXT SESSION: Initiate HEP for midback strengthening and neck stretching. Work on shoulder/rotator cuff strengthening as tolerated. Manual work/TPDN as indicated.    Vaishnavi Dalby Breona Ma L Daeshon Grammatico, PT 06/23/2023, 4:17  PM

## 2023-06-26 ENCOUNTER — Ambulatory Visit: Payer: Managed Care, Other (non HMO) | Admitting: Physical Therapy

## 2023-06-30 ENCOUNTER — Ambulatory Visit: Payer: Managed Care, Other (non HMO) | Admitting: Physical Therapy

## 2023-06-30 ENCOUNTER — Encounter: Payer: Self-pay | Admitting: Physical Therapy

## 2023-06-30 DIAGNOSIS — M546 Pain in thoracic spine: Secondary | ICD-10-CM

## 2023-06-30 DIAGNOSIS — R293 Abnormal posture: Secondary | ICD-10-CM

## 2023-06-30 DIAGNOSIS — M6281 Muscle weakness (generalized): Secondary | ICD-10-CM

## 2023-06-30 DIAGNOSIS — M542 Cervicalgia: Secondary | ICD-10-CM

## 2023-06-30 IMAGING — MG MM DIGITAL SCREENING BILAT W/ TOMO AND CAD
8 series · 8 of 24 positions shown · non-contrast
Comparison: Previous exam(s).

CLINICAL DATA: Screening.

EXAM:
DIGITAL SCREENING BILATERAL MAMMOGRAM WITH TOMOSYNTHESIS AND CAD
TECHNIQUE: Bilateral screening digital craniocaudal and mediolateral oblique
mammograms were obtained. Bilateral screening digital breast
tomosynthesis was performed. The images were evaluated with
computer-aided detection.

[L CC synth-2D]
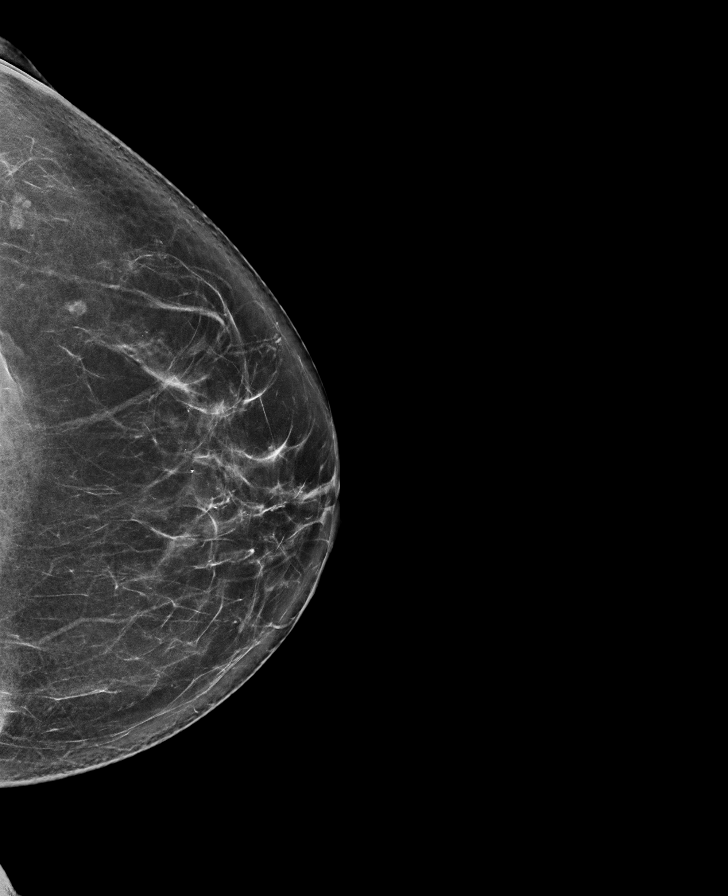

[R CC synth-2D]
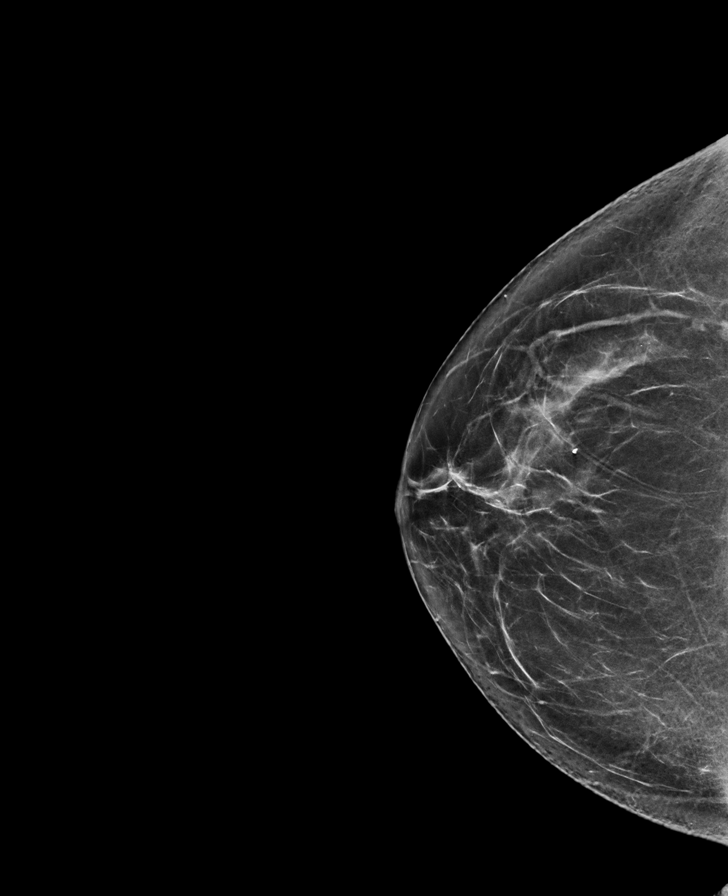

[L MLO synth-2D]
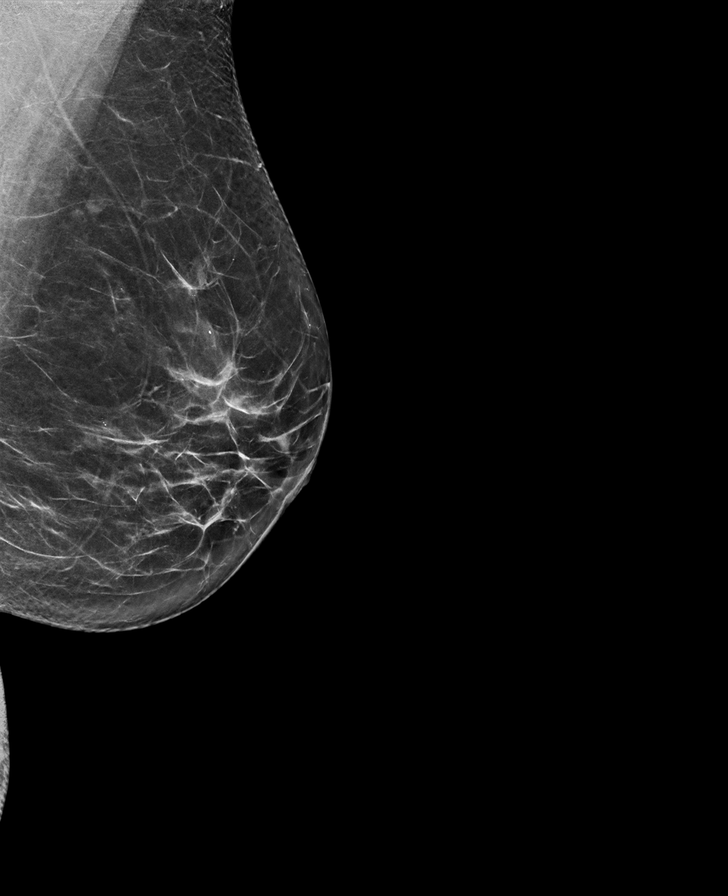

[R MLO synth-2D]
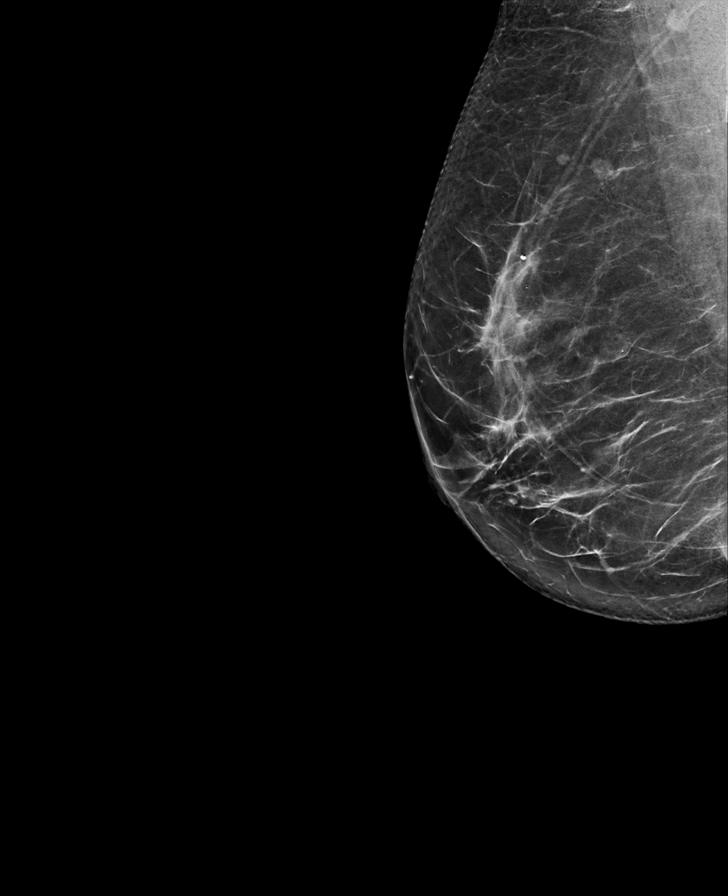

[R CC tomo · tomo slice 37/74.0]
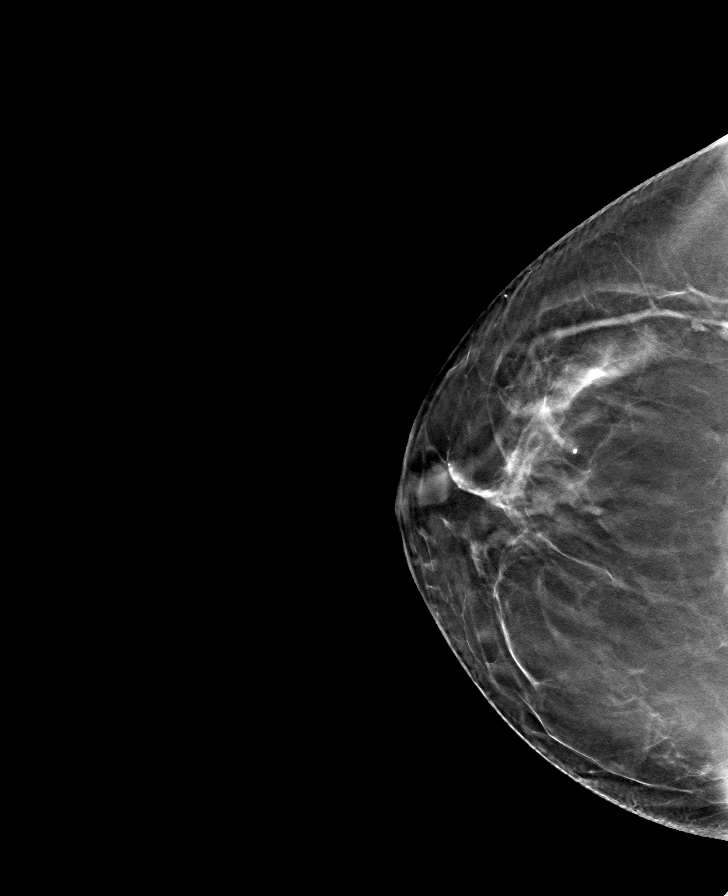

[L CC tomo · tomo slice 39/78.0]
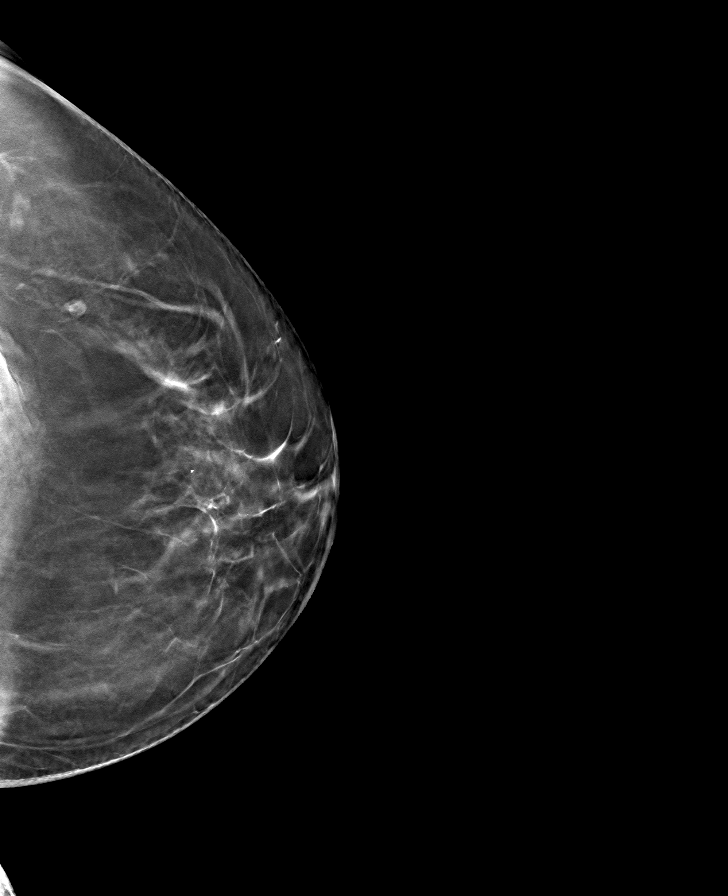

[R MLO tomo · tomo slice 41/80.0]
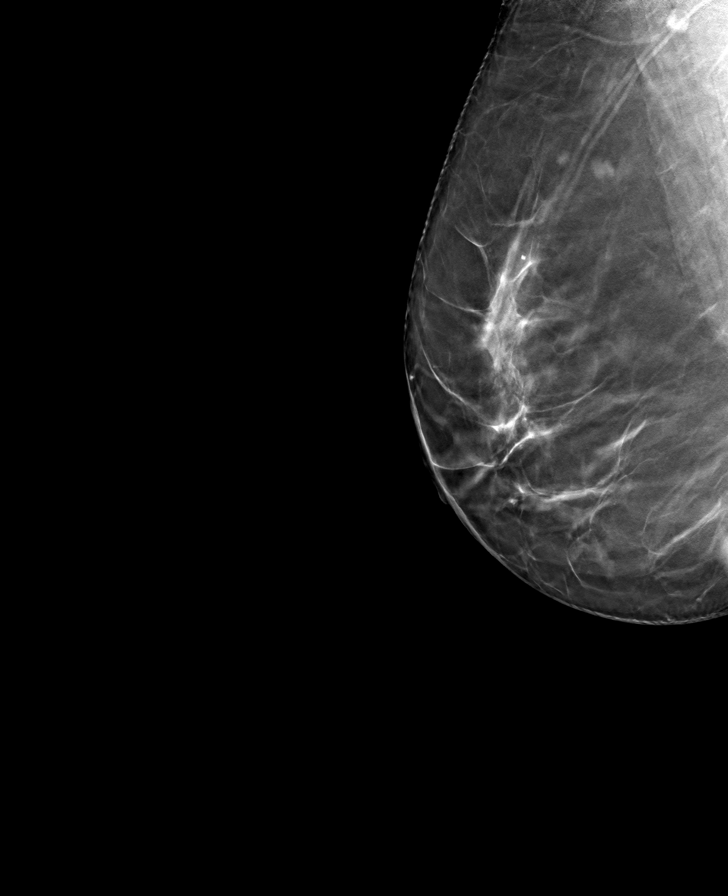

[L MLO tomo · tomo slice 37/73.0]
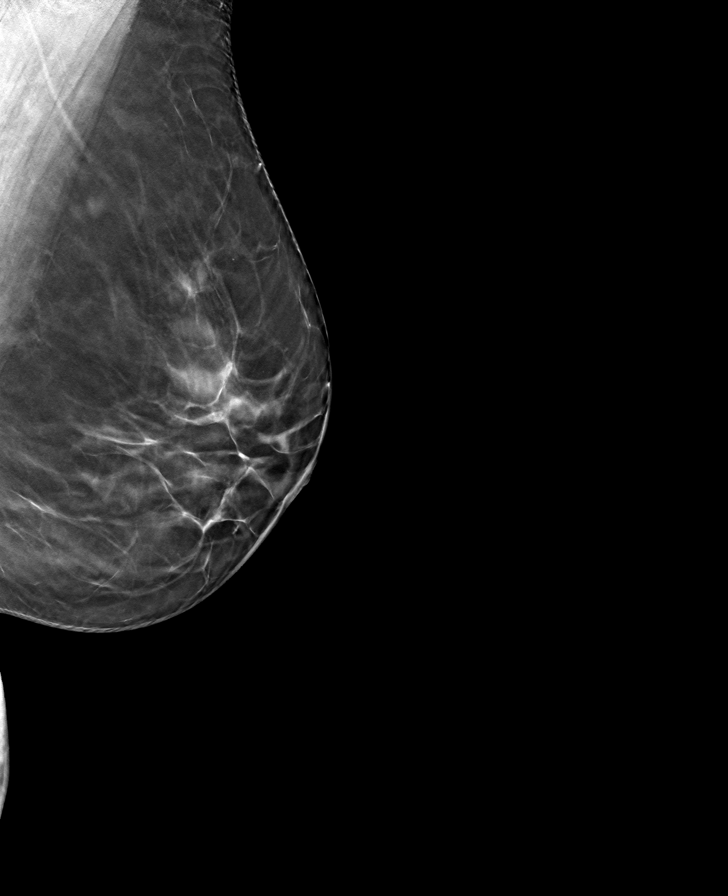

[8 of 24 positions shown; findings below may reference images not displayed]

ACR Breast Density Category b: There are scattered areas of
fibroglandular density.
FINDINGS: There are no findings suspicious for malignancy.
IMPRESSION: No mammographic evidence of malignancy. A result letter of this
screening mammogram will be mailed directly to the patient.

RECOMMENDATION:
Screening mammogram in one year. (Code:51-O-LD2)

BI-RADS CATEGORY  1: Negative.

## 2023-06-30 NOTE — Therapy (Signed)
OUTPATIENT PHYSICAL THERAPY CERVICAL TREATMENT   Patient Name: Anna Robinson MRN: [id number] DOB:03-Jun-1971, 52 y.o., female Today's Date: 06/30/2023  END OF SESSION:  PT End of Session - 06/30/23 1615     Visit Number 8    Number of Visits 12    Date for PT Re-Evaluation 07/09/23    Authorization Type [employer]    PT Start Time 1615    PT Stop Time 1700    PT Time Calculation (min) 45 min    Activity Tolerance Patient tolerated treatment well              Past Medical History:  Diagnosis Date   Allergy    Anxiety    Constipation    GERD (gastroesophageal reflux disease)    IBS (irritable bowel syndrome)    PONV (postoperative nausea and vomiting)    Seasonal allergies    Past Surgical History:  Procedure Laterality Date   COLONOSCOPY     ~10 yrs- normal per pt    TOTAL ABDOMINAL HYSTERECTOMY     WISDOM TOOTH EXTRACTION     age 21-17   Patient Active Problem List   Diagnosis Date Noted   Cervical neck pain with evidence of disc disease 05/06/2023   Migraine without status migrainosus, not intractable 04/13/2023   Nausea 04/13/2023   Muscle spasms of neck 04/13/2023   Post-menopausal 04/10/2023   DDD (degenerative disc disease), lumbar 12/01/2022   DDD (degenerative disc disease), 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   Mouth sores 12/01/2022   Lower abdominal pain 12/01/2022   Acute bilateral low back pain without sciatica 12/01/2022   Slow transit constipation 12/01/2022   Anxiety 05/30/2022   Chest pain 02/24/2022   Overweight (BMI 25.0-29.9) 02/24/2022   Gastroesophageal reflux disease without esophagitis 02/24/2022   Sinus arrhythmia seen on electrocardiogram 02/24/2022   Sinus bradycardia 02/24/2022   Class 1 obesity due to excess calories without serious comorbidity with body mass index (BMI) of 31.0 to 31.9 in adult 02/19/2021   Fibrocystic changes of left breast 05/15/2020   Class 1 obesity due to excess calories without serious comorbidity with body mass index  (BMI) of 30.0 to 30.9 in adult 02/01/2020   Right ovarian cyst 06/02/2019   Elevated liver enzymes 02/14/2019   Vitamin D insufficiency 01/26/2019   Elevated fasting glucose 01/26/2019   Dyslipidemia 01/26/2019   Epigastric pain 02/15/2018   Fatigue 01/15/2018   Muscle cramps 03/02/2015    PCP: Jomarie Longs, [name]  REFERRING PROVIDER: Jomarie Longs, [name]  REFERRING DIAG: [phone] (ICD-10-CM) - Muscle spasms of neck M50.90 (ICD-10-CM) - Cervical neck pain with evidence of disc disease R51.9 (ICD-10-CM) - Frequent headaches  THERAPY DIAG:  Cervicalgia  Pain in thoracic spine  Muscle weakness (generalized)  Abnormal posture  Rationale for Evaluation and Treatment: Rehabilitation  ONSET DATE: January 2024  SUBJECTIVE:  SUBJECTIVE STATEMENT: Pt states she was sick last week -- things have improved now. She reports she has had increased stress at work. Slight headache which has been improving. Pt states that the shoulder exercises have been killing her midback.  Hand dominance: Right  PERTINENT HISTORY:  N/a  PAIN:  Are you having pain? Yes: NPRS scale: currently 0/10; at worst 8/10 Pain location: now feeling pain and tightness in the shoulder blade area  Pain description: tightness; throbbing; aching R side constant  Aggravating factors: not sure  Relieving factors: medication, chiropractor, massage, dry needling   PRECAUTIONS: None  WEIGHT BEARING RESTRICTIONS: No  FALLS:  Has patient fallen in last 6 months? 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 No large vessel occlusion or proximal hemodynamically  significant stenosis. 2. No aneurysm identified.  IMPRESSION: Normal MRI of the brain.   PATIENT SURVEYS:  FOTO 58; predicted 69   POSTURE: rounded shoulders and forward head  PALPATION: TTP R periscapular muscles, thoracic paraspinals, bilat UTs, bilat cervical paraspinals and suboccipitals   CERVICAL ROM:   Active ROM A/PROM (deg) eval  Flexion 30  Extension 45  Right lateral flexion 39  Left lateral flexion 30*  Right rotation 44  Left rotation 52   (Blank rows = not tested)  UPPER EXTREMITY ROM:  Active ROM Right eval Left eval  Shoulder flexion WFL pulls into mid back [facility]  Shoulder extension    Shoulder abduction WFL pulls into mid back [facility]  Shoulder adduction    Shoulder extension    Shoulder internal rotation T10* T4  Shoulder external rotation T2 T2  Elbow flexion    Elbow extension    Wrist flexion    Wrist extension    Wrist ulnar deviation    Wrist radial deviation    Wrist pronation    Wrist supination     (Blank rows = not tested) * pain  UPPER EXTREMITY MMT:  MMT Right eval Left eval  Shoulder flexion 3+ 3+  Shoulder extension 4 5  Shoulder abduction 3+ 3+  Shoulder adduction    Shoulder internal rotation 3 5  Shoulder external rotation 3+ 5  Middle trapezius    Lower trapezius    Elbow flexion    Elbow extension    Wrist flexion    Wrist extension    Wrist ulnar deviation    Wrist radial deviation    Wrist pronation    Wrist supination    Grip strength     (Blank rows = not tested)  CERVICAL SPECIAL TESTS:  Did not assess  FUNCTIONAL TESTS:  Did not assess  TODAY'S TREATMENT:     [facility] Adult PT Treatment:                                                DATE: 06/30/23 Therapeutic Exercise: UBE L4; 4 min alt fwd/bwd Supine On foam roll: goal post stretch x30 sec On foam roll: Y stretch x30 sec On foam roll snow angel x10 Shoulder flexion 2# 2x10 Shoulder IR with shoulder abducted 60 deg 2# 2x10 Sidelying Open/close  book x10 Shoulder abd 2# 2x10 Shoulder ER 2# 2x10 Manual Therapy: TPR & STM R periscapular muscles and  UT Skilled assessment and palpation for TPDN Trigger Point Dry-Needling  Treatment instructions: Expect mild to moderate muscle soreness. S/S of pneumothorax if dry needled over a lung field, and to seek immediate medical attention should they occur. Patient verbalized understanding of these instructions and education. Patient Consent Given: Yes Education handout provided: Previously provided Muscles treated: bilat UT, R periscapular muscles, R subscap Electrical stimulation performed: No Parameters: N/A Treatment response/outcome: Twitch response ilicited, increased muscle length   [facility] Adult PT Treatment:                                                DATE: 06/23/23 Therapeutic Exercise: UBE L4; 4 min alt fwd/bwd Doorway pec stretch low, mid, high x30 sec each Pball roll out x30 sec, lateral flexion x30 sec Row blue TB 2x10 Shoulder ext blue TB 2x10 Shoulder ER green TB 2x10 Shoulder IR red TB 2x10 Cervical retraction into coregeous ball with shoulder flexion x10, with shoulder abd x10 Manual Therapy: STM & TPR UT, cervical/thoracic paraspinals Skilled assessment and palpation for TPDN Trigger Point Dry-Needling  Treatment instructions: Expect mild to moderate muscle soreness. S/S of pneumothorax if dry needled over a lung field, and to seek immediate medical attention should they occur. Patient verbalized understanding of these instructions and education. Patient Consent Given: Yes Education handout provided: Previously provided Muscles treated: bilat UT, cervical paraspinals, L QL Electrical stimulation performed: No Parameters: N/A Treatment response/outcome: Twitch response ilicited, increased muscle length                                                                                                                            [facility] Adult PT Treatment:                                                 DATE: 06/15/23 Therapeutic Exercise: UBE L4; 4 min alt fwd/bwd UT stretch x 30 sec Levator scap stretch x 30 sec Doorway pec stretch x 30 sec low, mid, high Doorway biceps stretch bilat 30 sec x 2 Shoulder ER red TB 3 sec x 10 "W" red TB 3 sec x 10 Horizontal shoulder abd red TB 2x10 Row blue TB x10 10 Shoulder ext blue TB x10  Chin tuck with axial extension, scap squeeze prone 5 sec x 5   Manual Therapy: STM & TPR UT, cervical/thoracic paraspinals Skilled assessment and palpation for TPDN Trigger Point Dry-Needling  Treatment instructions: Expect mild to moderate muscle soreness. S/S of pneumothorax if dry needled over a lung field, and to seek immediate medical attention should they occur. Patient verbalized understanding of these instructions and education. Patient Consent Given: Yes Education handout provided: Previously provided Muscles treated: bilat UT,  bilat cervical paraspinals, bilat thoracic paraspinals, suboccipitals, rhomboids Electrical stimulation performed: No Parameters: N/A Treatment response/outcome: Twitch response ilicited, increased muscle length   PATIENT EDUCATION:  Education details: TPDN, exam findings, POC, initial HEP Person educated: Patient Education method: Explanation, Demonstration, and Handouts Education comprehension: verbalized understanding, returned demonstration, and needs further education  HOME EXERCISE PROGRAM:  Access Code: [email or web address] URL: [email or web address] Date: 06/08/2023 Prepared by: Corlis Leak  Exercises - Seated Cervical Retraction  - 2 x daily - 7 x weekly - 1-2 sets - 5-10 reps - 10 sec  hold - Seated Scapular Retraction  - 2 x daily - 7 x weekly - 1-2 sets - 10 reps - 10 sec  hold - Standing Backward Shoulder Rolls  - 2 x daily - 7 x weekly - 1 sets - 10 reps - 1-2 sec  hold - Standing Cervical Retraction with Sidebending  - 2 x daily - 7 x weekly - 1 sets - 5-6 reps - 10-15 sec   hold - Supine Diaphragmatic Breathing  - 2 x daily - 7 x weekly - 1 sets - 10 reps - 4-6 sec  hold - Supine Lower Trunk Rotation  - 2 x daily - 7 x weekly - 1 sets - 3-5 reps - 20-30 sec  hold - Shoulder External Rotation and Scapular Retraction with Resistance  - 1 x daily - 7 x weekly - 2 sets - 10 reps - Shoulder W - External Rotation with Resistance  - 1 x daily - 7 x weekly - 2 sets - 10 reps - Standing Shoulder Horizontal Abduction with Resistance  - 1 x daily - 7 x weekly - 2 sets - 10 reps - Standing Bilateral Low Shoulder Row with Anchored Resistance  - 2 x daily - 7 x weekly - 1-3 sets - 10 reps - 2-3 sec  hold - Shoulder extension with resistance - Neutral  - 1 x daily - 7 x weekly - 1-2 sets - 10 reps - 3-5 sec  hold  ASSESSMENT:  CLINICAL IMPRESSION: Pt with increased periscapular trigger points likely from progression of her scapular strengthening exercises last session. Provided TPDN to address. Continued rotator cuff strength. Discussed alternating days of rotator cuff strengthening and then scapular strengthening.   EVAL: Patient is a 52 y.o. F who was seen today for physical therapy evaluation and treatment for thoracic and cervical pain. Assessment significant for multiple muscles in spasm including UTs, suboccipitals, and periscapular muscles likely related to her headaches and dizziness. Pt demos decreased cervical ROM with R>L rotator cuff weakness and decreased postural stability. Pt would highly benefit from PT to address these issues to improve pain for work and home tasks. Performed trial of TPDN this session to see if it will decrease pt's pain.  OBJECTIVE IMPAIRMENTS:  multiple muscles in spasm including UTs, suboccipitals, and periscapular muscles likely related to her headaches and dizziness. Pt demos decreased cervical ROM with R>L rotator cuff weakness and decreased postural stability decreased activity tolerance, decreased endurance, decreased mobility, decreased ROM,  decreased strength, increased fascial restrictions, increased muscle spasms, impaired UE functional use, postural dysfunction, and pain.    GOALS: Goals reviewed with patient? Yes  SHORT TERM GOALS: Target date: 06/26/2023   Pt will be ind with initial HEP Baseline:  Goal status: MET  2.  Pt will report >/=50% improvement in headaches Baseline:  Goal status: INITIAL  3.  Pt will be ind with maintaining postural stability for working at her desk Baseline:  Goal status: MET    LONG TERM GOALS: Target date: 07/24/2023  Pt will be ind with management and progression of HEP Baseline:  Goal status: INITIAL  2.  Pt will be able to demo at least 4/5 periscapular muscles for improved postural stability Baseline:  Goal status: INITIAL  3.  Pt will report >/=75% improvement in pain Baseline:  Goal status: INITIAL  4.  Pt will have increased FOTO score to >/=69 Baseline:  Goal status: INITIAL    PLAN:  PT FREQUENCY: 2x/week  PT DURATION: 8 weeks  PLANNED INTERVENTIONS: Therapeutic exercises, Therapeutic activity, Neuromuscular re-education, Balance training, Gait training, Patient/Family education, Self Care, Joint mobilization, Vestibular training, Aquatic Therapy, Dry Needling, Electrical stimulation, Spinal mobilization, Cryotherapy, Moist heat, Taping, Traction, Ionotophoresis 4mg /ml Dexamethasone, Manual therapy, and Re-evaluation  PLAN FOR NEXT SESSION: Initiate HEP for midback strengthening and neck stretching. Work on shoulder/rotator cuff strengthening as tolerated. Manual work/TPDN as indicated.    Katlynne Mckercher [place] Ma L Elsa Ploch, PT 06/30/2023, 4:44 PM

## 2023-07-02 ENCOUNTER — Encounter: Payer: Self-pay | Admitting: Rehabilitative and Restorative Service Providers"

## 2023-07-02 ENCOUNTER — Ambulatory Visit: Payer: Managed Care, Other (non HMO) | Admitting: Rehabilitative and Restorative Service Providers"

## 2023-07-02 DIAGNOSIS — M6281 Muscle weakness (generalized): Secondary | ICD-10-CM

## 2023-07-02 DIAGNOSIS — M542 Cervicalgia: Secondary | ICD-10-CM

## 2023-07-02 DIAGNOSIS — R293 Abnormal posture: Secondary | ICD-10-CM

## 2023-07-02 DIAGNOSIS — M546 Pain in thoracic spine: Secondary | ICD-10-CM

## 2023-07-02 NOTE — Therapy (Signed)
OUTPATIENT PHYSICAL THERAPY CERVICAL TREATMENT   Patient Name: Anna Robinson MRN: [id number] DOB:1971-05-22, 52 y.o., female Today's Date: 07/02/2023  END OF SESSION:  PT End of Session - 07/02/23 1619     Visit Number 9    Number of Visits 12    Date for PT Re-Evaluation 07/09/23    Authorization Type [employer]    PT Start Time 1617    PT Stop Time 1704    PT Time Calculation (min) 47 min    Activity Tolerance Patient tolerated treatment well              Past Medical History:  Diagnosis Date   Allergy    Anxiety    Constipation    GERD (gastroesophageal reflux disease)    IBS (irritable bowel syndrome)    PONV (postoperative nausea and vomiting)    Seasonal allergies    Past Surgical History:  Procedure Laterality Date   COLONOSCOPY     ~10 yrs- normal per pt    TOTAL ABDOMINAL HYSTERECTOMY     WISDOM TOOTH EXTRACTION     age [age]   Patient Active Problem List   Diagnosis Date Noted   Cervical neck pain with evidence of disc disease 05/06/2023   Migraine without status migrainosus, not intractable 04/13/2023   Nausea 04/13/2023   Muscle spasms of neck 04/13/2023   Post-menopausal 04/10/2023   DDD (degenerative disc disease), lumbar 12/01/2022   DDD (degenerative disc disease), 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   Mouth sores 12/01/2022   Lower abdominal pain 12/01/2022   Acute bilateral low back pain without sciatica 12/01/2022   Slow transit constipation 12/01/2022   Anxiety 05/30/2022   Chest pain 02/24/2022   Overweight (BMI 25.0-29.9) 02/24/2022   Gastroesophageal reflux disease without esophagitis 02/24/2022   Sinus arrhythmia seen on electrocardiogram 02/24/2022   Sinus bradycardia 02/24/2022   Class 1 obesity due to excess calories without serious comorbidity with body mass index (BMI) of 31.0 to 31.9 in adult 02/19/2021   Fibrocystic changes of left breast 05/15/2020   Class 1 obesity due to excess calories without serious comorbidity with body mass index  (BMI) of 30.0 to 30.9 in adult 02/01/2020   Right ovarian cyst 06/02/2019   Elevated liver enzymes 02/14/2019   Vitamin D insufficiency 01/26/2019   Elevated fasting glucose 01/26/2019   Dyslipidemia 01/26/2019   Epigastric pain 02/15/2018   Fatigue 01/15/2018   Muscle cramps 03/02/2015    PCP: Jomarie Longs, [name]  REFERRING PROVIDER: Jomarie Longs, [name]  REFERRING DIAG: [phone] (ICD-10-CM) - Muscle spasms of neck M50.90 (ICD-10-CM) - Cervical neck pain with evidence of disc disease R51.9 (ICD-10-CM) - Frequent headaches  THERAPY DIAG:  Cervicalgia  Pain in thoracic spine  Muscle weakness (generalized)  Abnormal posture  Rationale for Evaluation and Treatment: Rehabilitation  ONSET DATE: January 2024  SUBJECTIVE:  SUBJECTIVE STATEMENT: Pt states she has some soreness in the R midback from the needling and stress at work. She had to take migraine medication last night. Her work has been busy and she has not been able to do her exercises this week. Mother in law is in hospice and not doing well.   Hand dominance: Right  PERTINENT HISTORY:  N/a  PAIN:  Are you having pain? Yes: NPRS scale: currently 7/10; at worst 8/10 Pain location: now feeling pain and tightness in the shoulder blade area  Pain description: tightness; throbbing; aching R side constant  Aggravating factors: not sure  Relieving factors: medication, chiropractor, massage, dry needling   PRECAUTIONS: None  WEIGHT BEARING RESTRICTIONS: No  FALLS:  Has patient fallen in last 6 months? 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 No large vessel  occlusion or proximal hemodynamically significant stenosis. 2. No aneurysm identified.  IMPRESSION: Normal MRI of the brain.   PATIENT SURVEYS:  FOTO 58; predicted 69   POSTURE: rounded shoulders and forward head  PALPATION: TTP R periscapular muscles, thoracic paraspinals, bilat UTs, bilat cervical paraspinals and suboccipitals   CERVICAL ROM:   Active ROM A/PROM (deg) eval  Flexion 30  Extension 45  Right lateral flexion 39  Left lateral flexion 30*  Right rotation 44  Left rotation 52   (Blank rows = not tested)  UPPER EXTREMITY ROM:  Active ROM Right eval Left eval  Shoulder flexion WFL pulls into mid back [facility]  Shoulder extension    Shoulder abduction WFL pulls into mid back [facility]  Shoulder adduction    Shoulder extension    Shoulder internal rotation T10* T4  Shoulder external rotation T2 T2  Elbow flexion    Elbow extension    Wrist flexion    Wrist extension    Wrist ulnar deviation    Wrist radial deviation    Wrist pronation    Wrist supination     (Blank rows = not tested) * pain  UPPER EXTREMITY MMT:  MMT Right eval Left eval  Shoulder flexion 3+ 3+  Shoulder extension 4 5  Shoulder abduction 3+ 3+  Shoulder adduction    Shoulder internal rotation 3 5  Shoulder external rotation 3+ 5  Middle trapezius    Lower trapezius    Elbow flexion    Elbow extension    Wrist flexion    Wrist extension    Wrist ulnar deviation    Wrist radial deviation    Wrist pronation    Wrist supination    Grip strength     (Blank rows = not tested)  CERVICAL SPECIAL TESTS:  Did not assess  FUNCTIONAL TESTS:  Did not assess   TODAY'S TREATMENT:     [facility] Adult PT Treatment:                                                DATE: 07/02/23 Therapeutic Exercise: UBE L5; 4 min alt fwd/bwd Standing  Doorway stretch 3 positions x 30 sec x 2  Scap squeeze with noodle 3 sec x 10  ER bilat with noodle red TB 3 sec x 10 x 2 W with noodle red TB 3 sec x  10 x  2 Horizontal shoulder abd with noodle red TB 2x10 Prone  Axial extension with scap squeeze 5 sec x 10  Goal post 5 sec x 10  Superman 5 sec x 10  Manual Therapy: TPR & STM R periscapular muscles and UT Skilled assessment and palpation for TPDN Trigger Point Dry-Needling  Treatment instructions: Expect mild to moderate muscle soreness. S/S of pneumothorax if dry needled over a lung field, and to seek immediate medical attention should they occur. Patient verbalized understanding of these instructions and education. Patient Consent Given: Yes Education handout provided: Previously provided Muscles treated: R thoracic and lower cervical periscapular muscles, upper trap,  L QL Electrical stimulation performed: Yes Parameters: mAmp current intensity to pt tolerance  Treatment response/outcome: decreased palpable tightness; decreased pain   TODAY'S TREATMENT:     [facility] Adult PT Treatment:                                                DATE: 06/30/23 Therapeutic Exercise: UBE L4; 4 min alt fwd/bwd Supine On foam roll: goal post stretch x30 sec On foam roll: Y stretch x30 sec On foam roll snow angel x10 Shoulder flexion 2# 2x10 Shoulder IR with shoulder abducted 60 deg 2# 2x10 Sidelying Open/close book x10 Shoulder abd 2# 2x10 Shoulder ER 2# 2x10 Manual Therapy: TPR & STM R periscapular muscles and UT Skilled assessment and palpation for TPDN Trigger Point Dry-Needling  Treatment instructions: Expect mild to moderate muscle soreness. S/S of pneumothorax if dry needled over a lung field, and to seek immediate medical attention should they occur. Patient verbalized understanding of these instructions and education. Patient Consent Given: Yes Education handout provided: Previously provided Muscles treated: bilat UT, R periscapular muscles, R subscap Electrical stimulation performed: No Parameters: N/A Treatment response/outcome: Twitch response ilicited, increased muscle length   [facility]  Adult PT Treatment:                                                DATE: 06/23/23 Therapeutic Exercise: UBE L4; 4 min alt fwd/bwd Doorway pec stretch low, mid, high x30 sec each Pball roll out x30 sec, lateral flexion x30 sec Row blue TB 2x10 Shoulder ext blue TB 2x10 Shoulder ER green TB 2x10 Shoulder IR red TB 2x10 Cervical retraction into coregeous ball with shoulder flexion x10, with shoulder abd x10 Manual Therapy: STM & TPR UT, cervical/thoracic paraspinals Skilled assessment and palpation for TPDN Trigger Point Dry-Needling  Treatment instructions: Expect mild to moderate muscle soreness. S/S of pneumothorax if dry needled over a lung field, and to seek immediate medical attention should they occur. Patient verbalized understanding of these instructions and education. Patient Consent Given: Yes Education handout provided: Previously provided Muscles treated: bilat UT, cervical paraspinals, L QL Electrical stimulation performed: No Parameters: N/A Treatment response/outcome: Twitch response ilicited, increased muscle length  [facility] Adult PT Treatment:                                                DATE: 06/15/23 Therapeutic Exercise: UBE L4; 4 min alt fwd/bwd UT stretch x 30 sec Levator scap stretch x 30 sec Doorway pec stretch x 30 sec low, mid, high Doorway biceps stretch bilat 30 sec x 2 Shoulder ER red TB 3 sec x 10 "W" red TB 3 sec x 10 Horizontal shoulder abd red TB 2x10 Row blue TB x10 10 Shoulder ext blue TB x10  Chin tuck with axial extension, scap squeeze prone 5 sec x 5   Manual Therapy: STM & TPR UT, cervical/thoracic paraspinals Skilled assessment and palpation for TPDN Trigger Point Dry-Needling  Treatment instructions: Expect mild to moderate muscle soreness. S/S of pneumothorax if dry needled over a lung field, and to seek immediate medical  attention should they occur. Patient verbalized understanding of these instructions and education. Patient Consent Given: Yes Education handout provided: Previously provided Muscles treated: bilat UT, bilat cervical paraspinals, bilat thoracic paraspinals, suboccipitals, rhomboids Electrical stimulation performed: No Parameters: N/A Treatment response/outcome: Twitch response ilicited, increased muscle length   PATIENT EDUCATION:  Education details: TPDN, exam findings, POC, initial HEP Person educated: Patient Education method: Explanation, Demonstration, and Handouts Education comprehension: verbalized understanding, returned demonstration, and needs further education  HOME EXERCISE PROGRAM:  Access Code: [email or web address] URL: [email or web address] Date: 06/08/2023 Prepared by: Corlis Leak  Exercises - Seated Cervical Retraction  - 2 x daily - 7 x weekly - 1-2 sets - 5-10 reps - 10 sec  hold - Seated Scapular Retraction  - 2 x daily - 7 x weekly - 1-2 sets - 10 reps - 10 sec  hold - Standing Backward Shoulder Rolls  - 2 x daily - 7 x weekly - 1 sets - 10 reps - 1-2 sec  hold - Standing Cervical Retraction with Sidebending  - 2 x daily - 7 x weekly - 1 sets - 5-6 reps - 10-15 sec  hold - Supine Diaphragmatic Breathing  - 2 x daily - 7 x weekly - 1 sets - 10 reps - 4-6 sec  hold - Supine Lower Trunk Rotation  - 2 x daily - 7 x weekly - 1 sets - 3-5 reps - 20-30 sec  hold - Shoulder External Rotation and Scapular Retraction with Resistance  - 1 x daily - 7 x weekly - 2 sets - 10 reps - Shoulder W - External Rotation with Resistance  - 1 x daily - 7 x weekly - 2 sets - 10 reps - Standing Shoulder Horizontal Abduction with Resistance  - 1 x daily - 7 x weekly - 2 sets - 10 reps - Standing Bilateral Low Shoulder Row with Anchored Resistance  - 2 x daily - 7 x weekly - 1-3 sets - 10 reps - 2-3 sec  hold - Shoulder extension with resistance - Neutral  - 1 x daily - 7 x weekly - 1-2  sets - 10 reps - 3-5 sec  hold  ASSESSMENT:  CLINICAL IMPRESSION: Pt with increased pain in the R periscapular area with active trigger points. Continued stretching and postural strengthening. Manual work and DN through the R thoracic paraspinals; upper trap adding estim with DN. Added cat cow and child's pose to stretch paraspinals. DN to L QL which  was also tight today. Encouraged patient to resume consistent exercise program.    EVAL: Patient is a 52 y.o. F who was seen today for physical therapy evaluation and treatment for thoracic and cervical pain. Assessment significant for multiple muscles in spasm including UTs, suboccipitals, and periscapular muscles likely related to her headaches and dizziness. Pt demos decreased cervical ROM with R>L rotator cuff weakness and decreased postural stability. Pt would highly benefit from PT to address these issues to improve pain for work and home tasks. Performed trial of TPDN this session to see if it will decrease pt's pain.  OBJECTIVE IMPAIRMENTS:  multiple muscles in spasm including UTs, suboccipitals, and periscapular muscles likely related to her headaches and dizziness. Pt demos decreased cervical ROM with R>L rotator cuff weakness and decreased postural stability decreased activity tolerance, decreased endurance, decreased mobility, decreased ROM, decreased strength, increased fascial restrictions, increased muscle spasms, impaired UE functional use, postural dysfunction, and pain.    GOALS: Goals reviewed with patient? Yes  SHORT TERM GOALS: Target date: 06/26/2023   Pt will be ind with initial HEP Baseline:  Goal status: MET  2.  Pt will report >/=50% improvement in headaches Baseline:  Goal status: INITIAL  3.  Pt will be ind with maintaining postural stability for working at her desk Baseline:  Goal status: MET    LONG TERM GOALS: Target date: 07/24/2023  Pt will be ind with management and progression of HEP Baseline:  Goal  status: INITIAL  2.  Pt will be able to demo at least 4/5 periscapular muscles for improved postural stability Baseline:  Goal status: INITIAL  3.  Pt will report >/=75% improvement in pain Baseline:  Goal status: INITIAL  4.  Pt will have increased FOTO score to >/=69 Baseline:  Goal status: INITIAL    PLAN:  PT FREQUENCY: 2x/week  PT DURATION: 8 weeks  PLANNED INTERVENTIONS: Therapeutic exercises, Therapeutic activity, Neuromuscular re-education, Balance training, Gait training, Patient/Family education, Self Care, Joint mobilization, Vestibular training, Aquatic Therapy, Dry Needling, Electrical stimulation, Spinal mobilization, Cryotherapy, Moist heat, Taping, Traction, Ionotophoresis 4mg /ml Dexamethasone, Manual therapy, and Re-evaluation  PLAN FOR NEXT SESSION: Initiate HEP for midback strengthening and neck stretching. Work on shoulder/rotator cuff strengthening as tolerated. Manual work/TPDN as indicated.    Chandrea Zellman Rober Minion, PT 07/02/2023, 4:20 PM

## 2023-07-07 ENCOUNTER — Encounter: Payer: Managed Care, Other (non HMO) | Admitting: Rehabilitative and Restorative Service Providers"

## 2023-07-09 ENCOUNTER — Ambulatory Visit: Payer: Managed Care, Other (non HMO) | Admitting: Physical Therapy

## 2023-07-14 ENCOUNTER — Ambulatory Visit: Payer: Managed Care, Other (non HMO) | Admitting: Rehabilitative and Restorative Service Providers"

## 2023-07-14 ENCOUNTER — Encounter: Payer: Self-pay | Admitting: Rehabilitative and Restorative Service Providers"

## 2023-07-14 DIAGNOSIS — M546 Pain in thoracic spine: Secondary | ICD-10-CM

## 2023-07-14 DIAGNOSIS — M542 Cervicalgia: Secondary | ICD-10-CM | POA: Diagnosis not present

## 2023-07-14 DIAGNOSIS — R293 Abnormal posture: Secondary | ICD-10-CM

## 2023-07-14 DIAGNOSIS — M6281 Muscle weakness (generalized): Secondary | ICD-10-CM

## 2023-07-14 NOTE — Therapy (Signed)
OUTPATIENT PHYSICAL THERAPY CERVICAL TREATMENT   Patient Name: Anna Robinson MRN: [id number] DOB:Apr 18, 1971, 52 y.o., female Today's Date: 07/14/2023  END OF SESSION:  PT End of Session - 07/14/23 1622     Visit Number 10    Number of Visits 12    Date for PT Re-Evaluation 07/09/23    Authorization Type [employer]    PT Start Time 1615    PT Stop Time 1703    PT Time Calculation (min) 48 min    Activity Tolerance Patient tolerated treatment well              Past Medical History:  Diagnosis Date   Allergy    Anxiety    Constipation    GERD (gastroesophageal reflux disease)    IBS (irritable bowel syndrome)    PONV (postoperative nausea and vomiting)    Seasonal allergies    Past Surgical History:  Procedure Laterality Date   COLONOSCOPY     ~10 yrs- normal per pt    TOTAL ABDOMINAL HYSTERECTOMY     WISDOM TOOTH EXTRACTION     age 40-17   Patient Active Problem List   Diagnosis Date Noted   Cervical neck pain with evidence of disc disease 05/06/2023   Migraine without status migrainosus, not intractable 04/13/2023   Nausea 04/13/2023   Muscle spasms of neck 04/13/2023   Post-menopausal 04/10/2023   DDD (degenerative disc disease), lumbar 12/01/2022   DDD (degenerative disc disease), 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   Mouth sores 12/01/2022   Lower abdominal pain 12/01/2022   Acute bilateral low back pain without sciatica 12/01/2022   Slow transit constipation 12/01/2022   Anxiety 05/30/2022   Chest pain 02/24/2022   Overweight (BMI 25.0-29.9) 02/24/2022   Gastroesophageal reflux disease without esophagitis 02/24/2022   Sinus arrhythmia seen on electrocardiogram 02/24/2022   Sinus bradycardia 02/24/2022   Class 1 obesity due to excess calories without serious comorbidity with body mass index (BMI) of 31.0 to 31.9 in adult 02/19/2021   Fibrocystic changes of left breast 05/15/2020   Class 1 obesity due to excess calories without serious comorbidity with body mass  index (BMI) of 30.0 to 30.9 in adult 02/01/2020   Right ovarian cyst 06/02/2019   Elevated liver enzymes 02/14/2019   Vitamin D insufficiency 01/26/2019   Elevated fasting glucose 01/26/2019   Dyslipidemia 01/26/2019   Epigastric pain 02/15/2018   Fatigue 01/15/2018   Muscle cramps 03/02/2015    PCP: Jomarie Longs, [name]  REFERRING PROVIDER: Jomarie Longs, [name]  REFERRING DIAG: [id number] (ICD-10-CM) - Muscle spasms of neck M50.90 (ICD-10-CM) - Cervical neck pain with evidence of disc disease R51.9 (ICD-10-CM) - Frequent headaches  THERAPY DIAG:  Cervicalgia  Pain in thoracic spine  Muscle weakness (generalized)  Abnormal posture  Rationale for Evaluation and Treatment: Rehabilitation  ONSET DATE: January 2024  SUBJECTIVE:  SUBJECTIVE STATEMENT: Pt states she has some soreness and pain in the R midback and in her neck. She did not sleep well last night. Her mother in law passed last week and she has been stressed with that and with return to work. She has a migraine today. Her work has been busy and she has been trying to catch up with work from being off last week. She has been doing her exercises this week. Significant improvement in headache and neck pain with treatment today.  Hand dominance: Right  PERTINENT HISTORY:  N/a  PAIN:  Are you having pain? Yes: NPRS scale: currently 8-9/10; at worst 8/10 Pain location: now feeling pain and tightness in the shoulder blade area  Pain description: tightness; throbbing; aching R side constant  Aggravating factors: not sure  Relieving factors: medication, chiropractor, massage, dry needling   PRECAUTIONS: None  WEIGHT BEARING RESTRICTIONS: No  FALLS:  Has patient fallen in last 6 months? 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 No large vessel occlusion or proximal hemodynamically significant stenosis. 2. No aneurysm identified.  IMPRESSION: Normal MRI of the brain.   PATIENT SURVEYS:  FOTO 58; predicted 69   POSTURE: rounded shoulders and forward head  PALPATION: TTP R periscapular muscles, thoracic paraspinals, bilat UTs, bilat cervical paraspinals and suboccipitals   CERVICAL ROM:   Active ROM A/PROM (deg) eval  Flexion 30  Extension 45  Right lateral flexion 39  Left lateral flexion 30*  Right rotation 44  Left rotation 52   (Blank rows = not tested)  UPPER EXTREMITY ROM:  Active ROM Right eval Left eval  Shoulder flexion WFL pulls into mid back [facility]  Shoulder extension    Shoulder abduction WFL pulls into mid back [facility]  Shoulder adduction    Shoulder extension    Shoulder internal rotation T10* T4  Shoulder external rotation T2 T2  Elbow flexion    Elbow extension    Wrist flexion    Wrist extension    Wrist ulnar deviation    Wrist radial deviation    Wrist pronation    Wrist supination     (Blank rows = not tested) * pain  UPPER EXTREMITY MMT:  MMT Right eval Left eval  Shoulder flexion 3+ 3+  Shoulder extension 4 5  Shoulder abduction 3+ 3+  Shoulder adduction    Shoulder internal rotation 3 5  Shoulder external rotation 3+ 5  Middle trapezius    Lower trapezius    Elbow flexion    Elbow extension    Wrist flexion    Wrist extension    Wrist ulnar deviation    Wrist radial deviation    Wrist pronation    Wrist supination    Grip strength     (Blank rows = not tested)  CERVICAL SPECIAL TESTS:  Did not assess  FUNCTIONAL TESTS:  Did not assess   TODAY'S TREATMENT:     [facility] Adult PT Treatment:                                                DATE: 07/14/23 Therapeutic Exercise: UBE L5; 4 min  alt fwd/bwd Standing  Doorway stretch 3 positions x 30  sec x 2  Prone  Axial extension with scap squeeze 5 sec x 10  Manual Therapy: TPR & STM R periscapular muscles and UT Skilled assessment and palpation for TPDN Trigger Point Dry-Needling  Treatment instructions: Expect mild to moderate muscle soreness. S/S of pneumothorax if dry needled over a lung field, and to seek immediate medical attention should they occur. Patient verbalized understanding of these instructions and education. Patient Consent Given: Yes Education handout provided: Previously provided Muscles treated: R thoracic and bilat lower cervical periscapular muscles, upper trap, suboccipitals, cervical paraspinals, scaleni Electrical stimulation performed: Yes upper traps Parameters: mAmp current intensity to pt tolerance  Treatment response/outcome: decreased palpable tightness; decreased pain    [facility] Adult PT Treatment:                                                DATE: 07/02/23 Therapeutic Exercise: UBE L5; 4 min alt fwd/bwd Standing  Doorway stretch 3 positions x 30 sec x 2  Scap squeeze with noodle 3 sec x 10  ER bilat with noodle red TB 3 sec x 10 x 2 W with noodle red TB 3 sec x 10 x 2 Horizontal shoulder abd with noodle red TB 2x10 Prone  Axial extension with scap squeeze 5 sec x 10  Goal post 5 sec x 10  Superman 5 sec x 10  Manual Therapy: TPR & STM R periscapular muscles and UT Skilled assessment and palpation for TPDN Trigger Point Dry-Needling  Treatment instructions: Expect mild to moderate muscle soreness. S/S of pneumothorax if dry needled over a lung field, and to seek immediate medical attention should they occur. Patient verbalized understanding of these instructions and education. Patient Consent Given: Yes Education handout provided: Previously provided Muscles treated: R thoracic and lower cervical periscapular muscles, upper trap,  L QL Electrical stimulation performed: Yes Parameters:  mAmp current intensity to pt tolerance  Treatment response/outcome: decreased palpable tightness; decreased pain   TODAY'S TREATMENT:     [facility] Adult PT Treatment:                                                DATE: 06/30/23 Therapeutic Exercise: UBE L4; 4 min alt fwd/bwd Supine On foam roll: goal post stretch x30 sec On foam roll: Y stretch x30 sec On foam roll snow angel x10 Shoulder flexion 2# 2x10 Shoulder IR with shoulder abducted 60 deg 2# 2x10 Sidelying Open/close book x10 Shoulder abd 2# 2x10 Shoulder ER 2# 2x10 Manual Therapy: TPR & STM R periscapular muscles and UT Skilled assessment and palpation for TPDN Trigger Point Dry-Needling  Treatment instructions: Expect mild to moderate muscle soreness. S/S of pneumothorax if dry needled over a lung field, and to seek immediate medical attention should they occur. Patient verbalized understanding of these instructions and education. Patient Consent Given: Yes Education handout provided: Previously provided Muscles treated: bilat UT, R periscapular muscles, R subscap Electrical stimulation performed: No Parameters: N/A Treatment response/outcome: Twitch response ilicited, increased muscle lengt  PATIENT EDUCATION:  Education details: TPDN, exam findings, POC, initial HEP Person educated: Patient Education method: Explanation, Demonstration, and Handouts Education comprehension: verbalized understanding, returned demonstration, and needs further education  HOME EXERCISE PROGRAM:  Access Code: [email or web address] URL: [email or web address] Date: 06/08/2023 Prepared by: Corlis Leak  Exercises - Seated Cervical Retraction  - 2 x daily - 7 x weekly - 1-2 sets - 5-10 reps - 10 sec  hold - Seated Scapular Retraction  - 2 x daily - 7 x weekly - 1-2 sets - 10 reps - 10 sec  hold - Standing Backward  Shoulder Rolls  - 2 x daily - 7 x weekly - 1 sets - 10 reps - 1-2 sec  hold - Standing Cervical Retraction with Sidebending  - 2 x daily - 7 x weekly - 1 sets - 5-6 reps - 10-15 sec  hold - Supine Diaphragmatic Breathing  - 2 x daily - 7 x weekly - 1 sets - 10 reps - 4-6 sec  hold - Supine Lower Trunk Rotation  - 2 x daily - 7 x weekly - 1 sets - 3-5 reps - 20-30 sec  hold - Shoulder External Rotation and Scapular Retraction with Resistance  - 1 x daily - 7 x weekly - 2 sets - 10 reps - Shoulder W - External Rotation with Resistance  - 1 x daily - 7 x weekly - 2 sets - 10 reps - Standing Shoulder Horizontal Abduction with Resistance  - 1 x daily - 7 x weekly - 2 sets - 10 reps - Standing Bilateral Low Shoulder Row with Anchored Resistance  - 2 x daily - 7 x weekly - 1-3 sets - 10 reps - 2-3 sec  hold - Shoulder extension with resistance - Neutral  - 1 x daily - 7 x weekly - 1-2 sets - 10 reps - 3-5 sec  hold  ASSESSMENT:  CLINICAL IMPRESSION: Pt with increased headache and increased pain in the R periscapular area with active trigger points today. Patient has had a death the family and has experienced increased stress with this loss. She has continued stretching and postural strengthening as she could. Back to work this week and has been stressed with RTW.  Manual work and DN through the R thoracic paraspinals; upper trap with estim with DN. Encouraged patient to resume consistent exercise program.    EVAL: Patient is a 52 y.o. F who was seen today for physical therapy evaluation and treatment for thoracic and cervical pain. Assessment significant for multiple muscles in spasm including UTs, suboccipitals, and periscapular muscles likely related to her headaches and dizziness. Pt demos decreased cervical ROM with R>L rotator cuff weakness and decreased postural stability. Pt would highly benefit from PT to address these issues to improve pain for work and home tasks. Performed trial of TPDN this  session to see if it will decrease pt's pain.  OBJECTIVE IMPAIRMENTS:  multiple muscles in spasm including UTs, suboccipitals, and periscapular muscles likely related to her headaches and dizziness. Pt demos decreased cervical ROM with R>L rotator cuff weakness and decreased postural stability decreased activity tolerance, decreased endurance, decreased mobility, decreased ROM, decreased strength, increased fascial restrictions, increased muscle spasms, impaired UE functional use, postural dysfunction, and pain.    GOALS: Goals reviewed with patient? Yes  SHORT TERM GOALS: Target date: 06/26/2023   Pt will be ind with initial HEP Baseline:  Goal status: MET  2.  Pt will report >/=50% improvement in headaches Baseline:  Goal status: INITIAL  3.  Pt will be ind with maintaining postural stability for working at her desk Baseline:  Goal status: MET    LONG TERM GOALS: Target date: 07/24/2023  Pt will be ind with management and progression of HEP Baseline:  Goal status: INITIAL  2.  Pt will be able to demo at least 4/5 periscapular muscles for improved postural stability Baseline:  Goal status: INITIAL  3.  Pt will report >/=75% improvement in pain Baseline:  Goal status: INITIAL  4.  Pt will have increased FOTO score to >/=69 Baseline:  Goal status: INITIAL    PLAN:  PT FREQUENCY: 2x/week  PT DURATION: 8 weeks  PLANNED INTERVENTIONS: Therapeutic exercises, Therapeutic activity, Neuromuscular re-education, Balance training, Gait training, Patient/Family education, Self Care, Joint mobilization, Vestibular training, Aquatic Therapy, Dry Needling, Electrical stimulation, Spinal mobilization, Cryotherapy, Moist heat, Taping, Traction, Ionotophoresis 4mg /ml Dexamethasone, Manual therapy, and Re-evaluation  PLAN FOR NEXT SESSION: Initiate HEP for midback strengthening and neck stretching. Work on shoulder/rotator cuff strengthening as tolerated. Manual work/TPDN as indicated.     Val Riles, PT 07/14/2023, 5:05 PM

## 2023-07-16 ENCOUNTER — Encounter: Payer: Self-pay | Admitting: Rehabilitative and Restorative Service Providers"

## 2023-07-16 ENCOUNTER — Ambulatory Visit
Payer: Managed Care, Other (non HMO) | Attending: Physician Assistant | Admitting: Rehabilitative and Restorative Service Providers"

## 2023-07-16 DIAGNOSIS — R293 Abnormal posture: Secondary | ICD-10-CM | POA: Diagnosis present

## 2023-07-16 DIAGNOSIS — M6281 Muscle weakness (generalized): Secondary | ICD-10-CM | POA: Insufficient documentation

## 2023-07-16 DIAGNOSIS — M546 Pain in thoracic spine: Secondary | ICD-10-CM | POA: Insufficient documentation

## 2023-07-16 DIAGNOSIS — M542 Cervicalgia: Secondary | ICD-10-CM | POA: Insufficient documentation

## 2023-07-16 NOTE — Therapy (Signed)
OUTPATIENT PHYSICAL THERAPY CERVICAL TREATMENT   Patient Name: Anna Robinson MRN: [id number] DOB:[birth year], 52 y.o., female Today's Date: 07/16/2023  END OF SESSION:  PT End of Session - 07/16/23 1619     Visit Number 11    Number of Visits 24    Date for PT Re-Evaluation 09/24/23    Authorization Type [employer]    PT Start Time 1615    PT Stop Time 1700    PT Time Calculation (min) 45 min    Activity Tolerance Patient tolerated treatment well              Past Medical History:  Diagnosis Date   Allergy    Anxiety    Constipation    GERD (gastroesophageal reflux disease)    IBS (irritable bowel syndrome)    PONV (postoperative nausea and vomiting)    Seasonal allergies    Past Surgical History:  Procedure Laterality Date   COLONOSCOPY     ~10 yrs- normal per pt    TOTAL ABDOMINAL HYSTERECTOMY     WISDOM TOOTH EXTRACTION     age [age]   Patient Active Problem List   Diagnosis Date Noted   Cervical neck pain with evidence of disc disease 05/06/2023   Migraine without status migrainosus, not intractable 04/13/2023   Nausea 04/13/2023   Muscle spasms of neck 04/13/2023   Post-menopausal 04/10/2023   DDD (degenerative disc disease), lumbar 12/01/2022   DDD (degenerative disc disease), 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   Mouth sores 12/01/2022   Lower abdominal pain 12/01/2022   Acute bilateral low back pain without sciatica 12/01/2022   Slow transit constipation 12/01/2022   Anxiety 05/30/2022   Chest pain 02/24/2022   Overweight (BMI 25.0-29.9) 02/24/2022   Gastroesophageal reflux disease without esophagitis 02/24/2022   Sinus arrhythmia seen on electrocardiogram 02/24/2022   Sinus bradycardia 02/24/2022   Class 1 obesity due to excess calories without serious comorbidity with body mass index (BMI) of 31.0 to 31.9 in adult 02/19/2021   Fibrocystic changes of left breast 05/15/2020   Class 1 obesity due to excess calories without serious comorbidity with body mass index  (BMI) of 30.0 to 30.9 in adult 02/01/2020   Right ovarian cyst 06/02/2019   Elevated liver enzymes 02/14/2019   Vitamin D insufficiency 01/26/2019   Elevated fasting glucose 01/26/2019   Dyslipidemia 01/26/2019   Epigastric pain 02/15/2018   Fatigue 01/15/2018   Muscle cramps 03/02/2015    PCP: Jomarie Longs, [name]  REFERRING PROVIDER: Jomarie Longs, [name]  REFERRING DIAG: [phone] (ICD-10-CM) - Muscle spasms of neck M50.90 (ICD-10-CM) - Cervical neck pain with evidence of disc disease R51.9 (ICD-10-CM) - Frequent headaches  THERAPY DIAG:  Cervicalgia  Pain in thoracic spine  Muscle weakness (generalized)  Abnormal posture  Rationale for Evaluation and Treatment: Rehabilitation  ONSET DATE: January 2024  SUBJECTIVE:  SUBJECTIVE STATEMENT: Patient reports that she is feeling better than she was at last visit. She feels better overall than she started therapy. She is no longer having headaches every day but continues to have pain in the R shoulder blade area and into the R side and front of the shoulder. She has had pain and spasms in the R side since January which has been worse since March. She will have intermittent spasms in the R side.   Pt states she has some soreness and pain in the R midback and in her neck. She did not sleep well last night. Her mother in law passed last week and she has been stressed with that and with return to work. She has a migraine today. Her work has been busy and she has been trying to catch up with work from being off last week. She has been doing her exercises this week. Significant improvement in headache and neck pain with treatment today.  Hand dominance: Right  PERTINENT HISTORY:  N/a  PAIN:  Are you having pain? Yes: NPRS scale:  currently 8/10 in the R shoulder blade area; rest of body 5/10 Pain location: now feeling pain and tightness in the shoulder blade area  Pain description: tightness; throbbing; aching R side constant  Aggravating factors: not sure  Relieving factors: medication, chiropractor, massage, dry needling   PRECAUTIONS: None  WEIGHT BEARING RESTRICTIONS: No  FALLS:  Has patient fallen in last 6 months? 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 No large vessel occlusion or proximal hemodynamically significant stenosis. 2. No aneurysm identified.  IMPRESSION: Normal MRI of the brain.   PATIENT SURVEYS:  FOTO 58; predicted 69 07/16/23: 54   POSTURE: rounded shoulders and forward head  PALPATION: TTP R periscapular muscles, thoracic paraspinals, bilat UTs, bilat cervical paraspinals and suboccipitals   CERVICAL ROM:   Active ROM A/PROM (deg) eval AROM  07/16/23  Flexion 30 57  Extension 45 45  Right lateral flexion 39 30  Left lateral flexion 30* 27 pain   Right rotation 44 65   Left rotation 52 63 tight    (Blank rows = not tested)  UPPER EXTREMITY ROM:  Active ROM Right eval Left eval Right  07/16/23  Shoulder flexion WFL pulls into mid back [facility] WNL's   Shoulder extension     Shoulder abduction WFL pulls into mid back [facility] WNL's  Tight R side   Shoulder adduction     Shoulder extension     Shoulder internal rotation T10* T4 T8 pain   Shoulder external rotation T2 T2 WNL's   Elbow flexion     Elbow extension     Wrist flexion     Wrist extension     Wrist ulnar deviation     Wrist radial deviation     Wrist pronation     Wrist supination      (Blank rows = not tested) * pain  UPPER EXTREMITY MMT:  MMT Right eval Left eval Right  07/16/23 Left  07/16/23  Shoulder flexion 3+ 3+ 4 5  Shoulder extension 4 5 5  5   Shoulder abduction 3+ 3+ 4 5  Shoulder adduction      Shoulder internal rotation 3 5 4+ 5  Shoulder external rotation 3+ 5 4 5   Middle trapezius      Lower  trapezius      Elbow flexion      Elbow extension      Wrist flexion      Wrist extension      Wrist ulnar deviation      Wrist radial deviation      Wrist pronation      Wrist supination      Grip strength       (Blank rows = not tested)  CERVICAL SPECIAL TESTS:  Did not assess  FUNCTIONAL TESTS:  Did not assess   TODAY'S TREATMENT:     [facility] Adult PT Treatment:                                                DATE: 07/16/23 Therapeutic Exercise: UBE L5; 4 min alt fwd/bwd Standing  Doorway stretch 3 positions x 30 sec x 2  Sit Lateral flexion L/R 20 sec x2  Prone  Prone press up x 5 Cat cow x 5  Child's pose x 5 Axial extension with scap squeeze 5 sec x 10    [facility] Adult PT Treatment:                                                DATE: 07/14/23 Therapeutic Exercise: UBE L5; 4 min alt fwd/bwd Standing  Doorway stretch 3 positions x 30 sec x 2  Prone  Axial extension with scap squeeze 5 sec x 10  Manual Therapy: TPR & STM R periscapular muscles and UT Skilled assessment and palpation for TPDN Trigger Point Dry-Needling  Treatment instructions: Expect mild to moderate muscle soreness. S/S of pneumothorax if dry needled over a lung field, and to seek immediate medical attention should they occur. Patient verbalized understanding of these instructions and education. Patient Consent Given: Yes Education handout provided: Previously provided Muscles treated: R thoracic and bilat lower cervical periscapular muscles, upper trap, suboccipitals, cervical paraspinals, scaleni Electrical stimulation performed: Yes upper traps Parameters: mAmp current intensity to pt tolerance  Treatment response/outcome: decreased palpable tightness; decreased pain    [facility] Adult PT Treatment:                                                 DATE: 07/02/23 Therapeutic Exercise: UBE L5; 4 min alt fwd/bwd Standing  Doorway stretch 3 positions x 30 sec x 2  Scap squeeze with noodle 3 sec x 10  ER bilat with noodle red TB 3 sec x 10 x 2 W with noodle red TB 3 sec x 10 x 2 Horizontal shoulder abd with noodle red TB 2x10 Prone  Axial extension with scap squeeze 5 sec x 10  Goal post 5 sec x 10  Superman 5 sec x 10  Manual Therapy: TPR & STM R periscapular muscles and UT Skilled assessment and palpation for TPDN Trigger Point Dry-Needling  Treatment instructions: Expect mild to moderate muscle soreness. S/S of pneumothorax if dry needled over a lung field, and to seek immediate medical attention should they occur. Patient verbalized understanding of these instructions and education. Patient Consent Given: Yes Education handout provided: Previously  provided Muscles treated: R thoracic and lower cervical periscapular muscles, upper trap,  L QL Electrical stimulation performed: Yes Parameters: mAmp current intensity to pt tolerance  Treatment response/outcome: decreased palpable tightness; decreased pain                                                                                                                             PATIENT EDUCATION:  Education details: TPDN, exam findings, POC, initial HEP Person educated: Patient Education method: Explanation, Demonstration, and Handouts Education comprehension: verbalized understanding, returned demonstration, and needs further education  HOME EXERCISE PROGRAM:  Access Code: [email or web address] URL: [email or web address] Date: 06/08/2023 Prepared by: Corlis Leak  Exercises - Seated Cervical Retraction  - 2 x daily - 7 x weekly - 1-2 sets - 5-10 reps - 10 sec  hold - Seated Scapular Retraction  - 2 x daily - 7 x weekly - 1-2 sets - 10 reps - 10 sec  hold - Standing Backward Shoulder Rolls  - 2 x daily - 7 x weekly - 1 sets - 10 reps - 1-2 sec  hold - Standing Cervical  Retraction with Sidebending  - 2 x daily - 7 x weekly - 1 sets - 5-6 reps - 10-15 sec  hold - Supine Diaphragmatic Breathing  - 2 x daily - 7 x weekly - 1 sets - 10 reps - 4-6 sec  hold - Supine Lower Trunk Rotation  - 2 x daily - 7 x weekly - 1 sets - 3-5 reps - 20-30 sec  hold - Shoulder External Rotation and Scapular Retraction with Resistance  - 1 x daily - 7 x weekly - 2 sets - 10 reps - Shoulder W - External Rotation with Resistance  - 1 x daily - 7 x weekly - 2 sets - 10 reps - Standing Shoulder Horizontal Abduction with Resistance  - 1 x daily - 7 x weekly - 2 sets - 10 reps - Standing Bilateral Low Shoulder Row with Anchored Resistance  - 2 x daily - 7 x weekly - 1-3 sets - 10 reps - 2-3 sec  hold - Shoulder extension with resistance - Neutral  - 1 x daily - 7 x weekly - 1-2 sets - 10 reps - 3-5 sec  hold  ASSESSMENT:  CLINICAL IMPRESSION: Patient initially demonstrated good improvement in symptoms but has experienced a set back in the past 2 weeks with increased intensity of pain in the R side and shoulder blade area. Patient has a history of tummy tuck surgery ~ 2 years ago and has significant tightness to palpation through the abdomen into the R lateral trunk. She has tightness with lat stretch supine. Patient has limited trunk mobility related to abdominal restrictions. She will benefit from continued treatment to address facial restrictions and improve trunk mobility/ROM. She has continued headaches and increased pain in the R periscapular area with active trigger points. She has continued stretching and postural strengthening as well as adding exercises  at home including burpees which may have contributed to the flare up of her symptoms. Patient will benefit from continued treatment to address problems identified.    EVAL: Patient is a 52 y.o. F who was seen today for physical therapy evaluation and treatment for thoracic and cervical pain. Assessment significant for multiple muscles in  spasm including UTs, suboccipitals, and periscapular muscles likely related to her headaches and dizziness. Pt demos decreased cervical ROM with R>L rotator cuff weakness and decreased postural stability. Pt would highly benefit from PT to address these issues to improve pain for work and home tasks. Performed trial of TPDN this session to see if it will decrease pt's pain.  OBJECTIVE IMPAIRMENTS:  multiple muscles in spasm including UTs, suboccipitals, and periscapular muscles likely related to her headaches and dizziness. Pt demos decreased cervical ROM with R>L rotator cuff weakness and decreased postural stability decreased activity tolerance, decreased endurance, decreased mobility, decreased ROM, decreased strength, increased fascial restrictions, increased muscle spasms, impaired UE functional use, postural dysfunction, and pain.    GOALS: Goals reviewed with patient? Yes  SHORT TERM GOALS: Target date: 06/26/2023   Pt will be ind with initial HEP Baseline:  Goal status: MET  2.  Pt will report >/=50% improvement in headaches Baseline:  Goal status: met  3.  Pt will be ind with maintaining postural stability for working at her desk Baseline:  Goal status: MET    LONG TERM GOALS: Target date: 09/24/2023  Pt will be ind with management and progression of HEP Baseline:  Goal status: on going   2.  Pt will be able to demo at least 5/5 periscapular muscles for improved postural stability with no pain  Baseline:  Goal status: on going - revise   3.  Pt will report >/=75% improvement in pain Baseline:  Goal status: on going   4.  Pt will have increased FOTO score to >/=69 Baseline: 52 07/16/23: 54 Goal status: on going     PLAN:  PT FREQUENCY: 2x/week  PT DURATION: 8 weeks  PLANNED INTERVENTIONS: Therapeutic exercises, Therapeutic activity, Neuromuscular re-education, Balance training, Gait training, Patient/Family education, Self Care, Joint mobilization, Vestibular  training, Aquatic Therapy, Dry Needling, Electrical stimulation, Spinal mobilization, Cryotherapy, Moist heat, Taping, Traction, Ionotophoresis 4mg /ml Dexamethasone, Manual therapy, and Re-evaluation  PLAN FOR NEXT SESSION: Initiate HEP for midback strengthening and neck stretching. Work on shoulder/rotator cuff strengthening as tolerated. Manual work/TPDN as indicated.    Val Riles, PT 07/16/2023, 5:31 PM

## 2023-07-20 ENCOUNTER — Ambulatory Visit: Payer: Managed Care, Other (non HMO) | Admitting: Psychiatry

## 2023-07-20 ENCOUNTER — Encounter: Payer: Self-pay | Admitting: Psychiatry

## 2023-07-20 VITALS — BP 110/61 | HR 69 | Ht 66.0 in | Wt 187.2 lb

## 2023-07-20 DIAGNOSIS — M542 Cervicalgia: Secondary | ICD-10-CM

## 2023-07-20 DIAGNOSIS — G43709 Chronic migraine without aura, not intractable, without status migrainosus: Secondary | ICD-10-CM

## 2023-07-20 MED ORDER — NORTRIPTYLINE HCL 25 MG PO CAPS: 25.0000 mg | ORAL_CAPSULE | Freq: Every day | ORAL | 3 refills | Status: DC

## 2023-07-20 MED ORDER — BACLOFEN 10 MG PO TABS: ORAL_TABLET | ORAL | 30 refills | Status: DC

## 2023-07-20 MED ORDER — UBRELVY 100 MG PO TABS: 100.0000 mg | ORAL_TABLET | ORAL | 3 refills | Status: DC | PRN

## 2023-07-20 NOTE — Progress Notes (Signed)
Referring:  Charlton Amor, DO [address] [place],  [place] [address]  PCP: Jomarie Longs, [place]  Neurology was asked to evaluate Anna Robinson, a 52 year old female for a chief complaint of headaches.  Our recommendations of care will be communicated by shared medical record.    CC:  headaches  History provided from self  HPI:  Medical co-morbidities: gastric ulcer, IBS  The patient presents for evaluation of worsening headaches which began 4 months ago. She has a history of migraines, but they would typically only last 1-2 days at a time. On Shalen 22, 2024 she developed a migraine that would not go away. She was started on Topamax 25 mg BID which has helped somewhat but makes her "feel weird". She currently takes Maxalt as needed at least 3 times per week. It helps take the edge off but makes her feel weird and causes tingling.  She also reports severe neck pain and tension. Tried Flexeril which helped somewhat but caused drowsiness. She is undergoing neck PT now, which is somewhat helpful. Denies radicular pain or weakness/numbness of the upper extremities.  MRI brain 04/16/23 was unremarkable.  Headache History: Onset: Stephanie 2024 Aura: blurry vision Associated Symptoms:  Photophobia: yes  Phonophobia: yes  Nausea: yes Worse with activity?: yes  Migraine days per month: 15 Headache free days per month: 15  Current Treatment: Abortive Maxalt 10 mg PRN  Preventative Topamax 25 mg BID  Prior Therapies                                 Mobic Imitrex 50 mg PRN - side effects Maxalt 10 mg PRN - tingling Nurtec - lack of efficacy Flexeril Zofran  Topamax 25 mg BID Neck PT   LABS: CBC    Component Value Date/Time   WBC 9.0 06/20/2023 1554   WBC 6.0 03/30/2023 0919   RBC 4.71 06/20/2023 1554   RBC 5.01 03/30/2023 0919   HGB 13.9 06/20/2023 1554   HCT 42.0 06/20/2023 1554   PLT 244 06/20/2023 1554   MCV 89 06/20/2023 1554   MCH 29.5  06/20/2023 1554   MCH 28.5 03/30/2023 0919   MCHC 33.1 06/20/2023 1554   MCHC 32.9 03/30/2023 0919   RDW 13.8 06/20/2023 1554   LYMPHSABS 3.4 (H) 06/20/2023 1554   MONOABS 0.6 02/28/2015 1401   EOSABS 0.2 06/20/2023 1554   BASOSABS 0.0 06/20/2023 1554      Latest Ref Rng & Units 06/20/2023    3:54 PM 03/30/2023    9:19 AM 02/24/2022   12:00 AM  CMP  Glucose 70 - 99 mg/dL 604  99  540   BUN 6 - 24 mg/dL 17  17  13    Creatinine 0.57 - 1.00 mg/dL 9.81  1.91  4.78   Sodium 134 - 144 mmol/L 141  142  142   Potassium 3.5 - 5.2 mmol/L 4.3  4.6  4.2   Chloride 96 - 106 mmol/L 106  105  108   CO2 20 - 29 mmol/L 22  28  29    Calcium 8.7 - 10.2 mg/dL 9.3  9.7  8.7   Total Protein 6.0 - 8.5 g/dL 6.5  7.1  5.5   Total Bilirubin 0.0 - 1.2 mg/dL 0.7  0.9  0.9   Alkaline Phos 44 - 121 IU/L 135     AST 0 - 40 IU/L 42  24  20   ALT 0 - 32 IU/L 81  39  25       IMAGING:  MRI brain 04/16/23: unremarkable  Imaging independently reviewed on July 20, 2023   Current Outpatient Medications on File Prior to Visit  Medication Sig Dispense Refill   ALPRAZolam (XANAX) 0.25 MG tablet TAKE 1 TABLET BY MOUTH AT BEDTIME AS NEEDED FOR ANXIETY. 30 tablet 0   diphenhydrAMINE (BENADRYL) 25 mg capsule Take 25 mg by mouth every 6 (six) hours as needed.     linaclotide (LINZESS) 290 MCG CAPS capsule Take 1 capsule (290 mcg total) by mouth daily. 90 capsule 0   MAGNESIUM PO Take by mouth. Takes once a week     omeprazole (PRILOSEC) 40 MG capsule Take 1 capsule (40 mg total) by mouth daily. 90 capsule 0   ondansetron (ZOFRAN) 4 MG tablet Take 1 tablet (4 mg total) by mouth every 8 (eight) hours as needed for nausea or vomiting. 30 tablet 2   rizatriptan (MAXALT) 10 MG tablet Take 1 tablet (10 mg total) by mouth as needed for migraine. May repeat in 2 hours if needed 10 tablet 5   fexofenadine (ALLEGRA ALLERGY) 180 MG tablet Take 1 tablet (180 mg total) by mouth daily for 15 days. 15 tablet 0   meloxicam (MOBIC) 15  MG tablet Take 1 tablet (15 mg total) by mouth daily. (Patient not taking: Reported on 07/20/2023) 30 tablet 2   topiramate (TOPAMAX) 25 MG tablet Take 1 tablet (25 mg total) by mouth 2 (two) times daily. (Patient not taking: Reported on 07/20/2023) 180 tablet 0   No current facility-administered medications on file prior to visit.     Allergies: Allergies  Allergen Reactions   Penicillins Other (See Comments) and Rash    Chest tightness   Wellbutrin [Bupropion]     nosebleeds    Family History: Family History  Problem Relation Age of Onset   Heart disease Mother    Diabetes Mother    Hyperlipidemia Mother    Hypertension Mother    Stroke Mother    Breast cancer Mother    Cancer Father    Brain cancer Father    Lung cancer Father    Anuerysm Father    Depression Maternal Aunt    Heart disease Maternal Grandmother    Esophageal cancer Sister    Colon cancer Neg Hx    Colon polyps Neg Hx    Rectal cancer Neg Hx    Stomach cancer Neg Hx      Past Medical History: Past Medical History:  Diagnosis Date   Allergy    Anxiety    Constipation    GERD (gastroesophageal reflux disease)    IBS (irritable bowel syndrome)    PONV (postoperative nausea and vomiting)    Seasonal allergies     Past Surgical History Past Surgical History:  Procedure Laterality Date   COLONOSCOPY     ~10 yrs- normal per pt    TOTAL ABDOMINAL HYSTERECTOMY     WISDOM TOOTH EXTRACTION     age [age]    Social History: Social History   Tobacco Use   Smoking status: Never   Smokeless tobacco: Never  Substance Use Topics   Alcohol use: Not Currently    Comment: rare - not often per pt    Drug use: No    ROS: Negative for fevers, chills. Positive for headaches. All other systems reviewed and negative unless stated otherwise in HPI.  Physical Exam:   Vital Signs: BP 110/61 (BP Location: Left Arm, Patient Position: Sitting, Cuff Size: Normal)   Pulse 69   Ht 5\' 6"  (1.676 m)   Wt 187  lb 3.2 oz (84.9 kg)   BMI 30.21 kg/m  GENERAL: well appearing,in no acute distress,alert SKIN:  Color, texture, turgor normal. No rashes or lesions HEAD:  Normocephalic/atraumatic. CV:  RRR RESP: Normal respiratory effort MSK: +tenderness to palpation over bilateral occiput, neck, and shoulders  NEUROLOGICAL: Mental Status: Alert, oriented to person, place and time,Follows commands Cranial Nerves: PERRL, visual fields intact to confrontation, extraocular movements intact, facial sensation intact, no facial droop or ptosis, hearing grossly intact, no dysarthria Motor: muscle strength 5/5 both upper and lower extremities,no drift, normal tone Reflexes: 2+ throughout Sensation: intact to light touch all 4 extremities Coordination: Finger-to- nose-finger intact bilaterally Gait: normal-based   IMPRESSION: 52 year old female with a history of gastric ulcer, IBS who presents for evaluation of headaches and neck pain. MRI brain was unremarkable. She has had some improvement with Topamax, but it causes side effects. Will switch to nortriptyline for prevention, which may also help with her neck pain. Discussed Botox as a potential next step if nortriptyline is ineffective. Will try Ubrelvy for rescue as she has failed 2 triptans and Nurtec. Baclofen prescribed for muscle spasms as this may be less sedating than Flexeril.  PLAN: -Prevention: Stop Topamax. Start nortriptyline 25 mg at bedtime -Rescue: Start Ubrelvy 100 mg PRN -Baclofen 5-10 mg TID PRN for muscle spasms -Continue Neck PT -Next steps: Consider Botox for migraine prevention   I spent a total of 38 minutes chart reviewing and counseling the patient. Headache education was done. Discussed treatment options including preventive and acute medications, and physical therapy. Discussed medication side effects, adverse reactions and drug interactions. Written educational materials and patient instructions outlining all of the above were  given.  Follow-up: 3 months   Ocie Doyne, MD 07/20/2023   1:37 PM

## 2023-07-23 ENCOUNTER — Ambulatory Visit: Payer: Managed Care, Other (non HMO)

## 2023-07-29 ENCOUNTER — Other Ambulatory Visit (HOSPITAL_COMMUNITY): Payer: Self-pay

## 2023-07-29 ENCOUNTER — Telehealth: Payer: Self-pay

## 2023-07-29 ENCOUNTER — Ambulatory Visit: Payer: Managed Care, Other (non HMO)

## 2023-07-29 ENCOUNTER — Other Ambulatory Visit: Payer: Self-pay

## 2023-07-29 ENCOUNTER — Ambulatory Visit
Admission: EM | Admit: 2023-07-29 | Discharge: 2023-07-29 | Disposition: A | Discharge status: Home / Self Care | Payer: Managed Care, Other (non HMO) | Attending: Family Medicine | Admitting: Family Medicine

## 2023-07-29 DIAGNOSIS — R0782 Intercostal pain: Secondary | ICD-10-CM | POA: Diagnosis not present

## 2023-07-29 DIAGNOSIS — R0781 Pleurodynia: Secondary | ICD-10-CM

## 2023-07-29 DIAGNOSIS — R0789 Other chest pain: Secondary | ICD-10-CM | POA: Diagnosis not present

## 2023-07-29 MED ORDER — CELECOXIB 200 MG PO CAPS: 200.0000 mg | ORAL_CAPSULE | Freq: Every day | ORAL | 0 refills | Status: AC

## 2023-07-29 NOTE — Discharge Instructions (Addendum)
Patient advised of chest x-ray results with hardcopy provided to patient.  Advised patient to take medication as directed with food to completion.  Encouraged increase daily water intake to 64 ounces per day while taking this medication.  Advised if symptoms worsen and/or unresolved please follow-up with PCP or here for further evaluation.

## 2023-07-29 NOTE — Telephone Encounter (Signed)
Pharmacy Patient Advocate Encounter  Received notification from [employer] that Prior Authorization for Ubrelvy 100MG  tablets has been APPROVED from 07-29-2023 to 07-28-2024   PA #/Case ID/Reference #: [id number]

## 2023-07-29 NOTE — ED Triage Notes (Signed)
Right sided rib pain since falling at [employer] a week ago. Has c/o nausea also.

## 2023-07-29 NOTE — ED Provider Notes (Signed)
[name]    CSN: [id number] Arrival date & time: 07/29/23  1726      History   Chief Complaint No chief complaint on file.   HPI Anna Robinson is a 52 y.o. female.   HPI 52 year old female presents with right-sided rib pain since falling at [facility] 1 week ago.  Patient reports intermittent nausea as well.  PMH significant for obesity, IBS, migraine  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   Mouth sores 12/01/2022   Lower abdominal pain 12/01/2022   Acute bilateral low back pain without sciatica 12/01/2022   Slow transit constipation 12/01/2022   Anxiety 05/30/2022   Chest pain 02/24/2022   Overweight (BMI 25.0-29.9) 02/24/2022   Gastroesophageal reflux disease without esophagitis 02/24/2022   Sinus arrhythmia seen on electrocardiogram 02/24/2022   Sinus bradycardia 02/24/2022   Class 1 obesity due to excess calories without serious comorbidity with body mass index (BMI) of 31.0 to 31.9 in adult 02/19/2021   Fibrocystic changes of left breast 05/15/2020   Class 1 obesity due to excess calories without serious comorbidity with body mass index (BMI) of 30.0 to 30.9 in adult 02/01/2020   Right ovarian cyst 06/02/2019   Elevated liver enzymes 02/14/2019   Vitamin D insufficiency 01/26/2019   Elevated fasting glucose 01/26/2019   Dyslipidemia 01/26/2019   Epigastric pain 02/15/2018   Fatigue 01/15/2018    Muscle cramps 03/02/2015    Past Surgical History:  Procedure Laterality Date   COLONOSCOPY     ~10 yrs- normal per pt    TOTAL ABDOMINAL HYSTERECTOMY     WISDOM TOOTH EXTRACTION     age [age]    OB History     Gravida  3   Para  2   Term  2   Preterm      AB  1   Living         SAB      IAB  1   Ectopic      Multiple      Live Births               Home Medications    Prior to Admission medications   Medication Sig Start Date End Date Taking? Authorizing Provider  celecoxib (CELEBREX) 200 MG capsule Take 1 capsule (200 mg total) by mouth daily for 15 days. 07/29/23 08/13/23 Yes Trevor Iha, FNP  ALPRAZolam Prudy Feeler) 0.25 MG tablet TAKE 1 TABLET BY MOUTH AT BEDTIME AS NEEDED FOR ANXIETY. 06/09/23   Breeback, Jade L, [name]  baclofen (LIORESAL) 10 MG tablet Take 1/2-1 pill up to 3 times a day as needed for muscle spasms 07/20/23   Ocie Doyne, MD  diphenhydrAMINE (BENADRYL) 25 mg capsule Take 25 mg by mouth every 6 (six) hours as needed.    [provider]  linaclotide (LINZESS) 290 MCG CAPS capsule Take 1 capsule (290 mcg total) by mouth daily.  05/27/23   Breeback, Jade L, [name]  rizatriptan (MAXALT) 10 MG tablet Take 1 tablet (10 mg total) by mouth as needed for migraine. May repeat in 2 hours if needed 05/27/23   Breeback, Jade L, [name]  Ubrogepant (UBRELVY) 100 MG TABS Take 1 tablet (100 mg total) by mouth as needed (for migraine). May repeat a dose in 2 hours if needed 07/20/23   Ocie Doyne, MD    Family History Family History  Problem Relation Age of Onset   Heart disease Mother    Diabetes Mother    Hyperlipidemia Mother    Hypertension Mother    Stroke Mother    Breast cancer Mother    Cancer Father    Brain cancer Father    Lung cancer Father    Anuerysm Father    Depression Maternal Aunt    Heart disease Maternal Grandmother    Esophageal cancer Sister    Colon cancer Neg Hx    Colon polyps Neg Hx    Rectal cancer Neg Hx     Stomach cancer Neg Hx     Social History Social History   Tobacco Use   Smoking status: Never   Smokeless tobacco: Never  Substance Use Topics   Alcohol use: Not Currently    Comment: rare - not often per pt    Drug use: No     Allergies   Penicillins and Wellbutrin [bupropion]   Review of Systems Review of Systems  Musculoskeletal:        Chest wall pain/sternum pain/right rib pain secondary to fall 4 days ago     Physical Exam Triage Vital Signs ED Triage Vitals [07/29/23 1734]  Encounter Vitals Group     BP 128/89     Systolic BP Percentile      Diastolic BP Percentile      Pulse Rate 86     Resp 16     Temp 98.6 F (37 C)     Temp Source Oral     SpO2 99 %     Weight      Height      Head Circumference      Peak Flow      Pain Score 10     Pain Loc      Pain Education      Exclude from Growth Chart    No data found.  Updated Vital Signs BP 128/89 (BP Location: Right Arm)   Pulse 86   Temp 98.6 F (37 C) (Oral)   Resp 16   SpO2 99%     Physical Exam Vitals and nursing note reviewed.  Constitutional:      General: She is not in acute distress.    Appearance: Normal appearance. She is normal weight. She is not ill-appearing.  HENT:     Head: Normocephalic and atraumatic.     Mouth/Throat:     Mouth: Mucous membranes are moist.     Pharynx: Oropharynx is clear.  Eyes:     Extraocular Movements: Extraocular movements intact.     Conjunctiva/sclera: Conjunctivae normal.     Pupils: Pupils are equal, round, and reactive to light.  Cardiovascular:     Rate and Rhythm: Normal rate and regular rhythm.     Pulses: Normal pulses.     Heart sounds: Normal heart sounds.  Pulmonary:     Effort: Pulmonary effort is normal.     Breath sounds: Normal breath sounds. No wheezing, rhonchi or rales.  Musculoskeletal:  General: Normal range of motion.     Cervical back: Normal range of motion and neck supple.     Comments: Sternum/right-sided  ribs anterior lateral aspect patient reporting pain over 5th-8th rib, no deformity noted  Skin:    General: Skin is warm and dry.  Neurological:     General: No focal deficit present.     Mental Status: She is alert and oriented to person, place, and time.  Psychiatric:        Mood and Affect: Mood normal.        Behavior: Behavior normal.        Thought Content: Thought content normal.      UC Treatments / Results  Labs (all labs ordered are listed, but only abnormal results are displayed) Labs Reviewed - No data to display  EKG   Radiology DG Chest 2 View  Result Date: 07/29/2023 CLINICAL DATA:  Chest wall pain after fall EXAM: CHEST - 2 VIEW COMPARISON:  None Available. FINDINGS: The heart size and mediastinal contours are within normal limits. Both lungs are clear. No pneumothorax. No displaced rib fracture is identified. IMPRESSION: No active cardiopulmonary disease. Electronically Signed   By: Duanne Guess D.O.   On: 07/29/2023 17:59    Procedures Procedures (including critical care time)  Medications Ordered in UC Medications - No data to display  Initial Impression / Assessment and Plan / UC Course  I have reviewed the triage vital signs and the nursing notes.  Pertinent labs & imaging results that were available during my care of the patient were reviewed by me and considered in my medical decision making (see chart for details).     MDM: 1.  Chest wall pain-chest x-ray revealed above; 2.  Rib pain on right side-Rx'd Celebrex 200 mg capsule daily x 15 days. Patient advised of chest x-ray results with hardcopy provided to patient.  Advised patient to take medication as directed with food to completion.  Encouraged increase daily water intake to 64 ounces per day while taking this medication.  Advised if symptoms worsen and/or unresolved please follow-up with PCP or here for further evaluation.  Patient discharged home, hemodynamically stable. Final Clinical  Impressions(s) / UC Diagnoses   Final diagnoses:  Chest wall pain  Rib pain on right side     Discharge Instructions      Patient advised of chest x-ray results with hardcopy provided to patient.  Advised patient to take medication as directed with food to completion.  Encouraged increase daily water intake to 64 ounces per day while taking this medication.  Advised if symptoms worsen and/or unresolved please follow-up with PCP or here for further evaluation.     ED Prescriptions     Medication Sig Dispense Auth. Provider   celecoxib (CELEBREX) 200 MG capsule Take 1 capsule (200 mg total) by mouth daily for 15 days. 15 capsule Trevor Iha, FNP      PDMP not reviewed this encounter.   Trevor Iha, FNP 07/29/23 1826

## 2023-07-30 ENCOUNTER — Ambulatory Visit: Payer: Managed Care, Other (non HMO)

## 2023-07-30 DIAGNOSIS — M542 Cervicalgia: Secondary | ICD-10-CM

## 2023-07-30 DIAGNOSIS — R293 Abnormal posture: Secondary | ICD-10-CM

## 2023-07-30 DIAGNOSIS — M6281 Muscle weakness (generalized): Secondary | ICD-10-CM

## 2023-07-30 DIAGNOSIS — M546 Pain in thoracic spine: Secondary | ICD-10-CM

## 2023-07-30 NOTE — Therapy (Signed)
OUTPATIENT PHYSICAL THERAPY CERVICAL TREATMENT   Patient Name: Anna Robinson MRN: [id number] DOB:10-07-1971, 52 y.o., female Today's Date: 07/30/2023  END OF SESSION:  PT End of Session - 07/30/23 1620     Visit Number 12    Number of Visits 24    Date for PT Re-Evaluation 09/24/23    Authorization Type [employer]    PT Start Time 1620    PT Stop Time 1705    PT Time Calculation (min) 45 min    Activity Tolerance Patient tolerated treatment well    Behavior During Therapy WFL for tasks assessed/performed              Past Medical History:  Diagnosis Date   Allergy    Anxiety    Constipation    GERD (gastroesophageal reflux disease)    IBS (irritable bowel syndrome)    PONV (postoperative nausea and vomiting)    Seasonal allergies    Past Surgical History:  Procedure Laterality Date   COLONOSCOPY     ~10 yrs- normal per pt    TOTAL ABDOMINAL HYSTERECTOMY     WISDOM TOOTH EXTRACTION     age [age]   Patient Active Problem List   Diagnosis Date Noted   Cervical neck pain with evidence of disc disease 05/06/2023   Migraine without status migrainosus, not intractable 04/13/2023   Nausea 04/13/2023   Muscle spasms of neck 04/13/2023   Post-menopausal 04/10/2023   DDD (degenerative disc disease), lumbar 12/01/2022   DDD (degenerative disc disease), 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   Mouth sores 12/01/2022   Lower abdominal pain 12/01/2022   Acute bilateral low back pain without sciatica 12/01/2022   Slow transit constipation 12/01/2022   Anxiety 05/30/2022   Chest pain 02/24/2022   Overweight (BMI 25.0-29.9) 02/24/2022   Gastroesophageal reflux disease without esophagitis 02/24/2022   Sinus arrhythmia seen on electrocardiogram 02/24/2022   Sinus bradycardia 02/24/2022   Class 1 obesity due to excess calories without serious comorbidity with body mass index (BMI) of 31.0 to 31.9 in adult 02/19/2021   Fibrocystic changes of left breast 05/15/2020   Class 1 obesity due to  excess calories without serious comorbidity with body mass index (BMI) of 30.0 to 30.9 in adult 02/01/2020   Right ovarian cyst 06/02/2019   Elevated liver enzymes 02/14/2019   Vitamin D insufficiency 01/26/2019   Elevated fasting glucose 01/26/2019   Dyslipidemia 01/26/2019   Epigastric pain 02/15/2018   Fatigue 01/15/2018   Muscle cramps 03/02/2015    PCP: Jomarie Longs, [name]  REFERRING PROVIDER: Jomarie Longs, [name]  REFERRING DIAG: [phone] (ICD-10-CM) - Muscle spasms of neck M50.90 (ICD-10-CM) - Cervical neck pain with evidence of disc disease R51.9 (ICD-10-CM) - Frequent headaches  THERAPY DIAG:  Cervicalgia  Pain in thoracic spine  Muscle weakness (generalized)  Abnormal posture  Rationale for Evaluation and Treatment: Rehabilitation  ONSET DATE: January 2024  SUBJECTIVE:  SUBJECTIVE STATEMENT: Patient reports she had a major fall last Monday and her headaches and pain along R shoulder blade have returned; states her neurologist changes her medication to see of ot helps with migraines. Patient reports moderate pain along R shoulder and has a headache right now but it is easing due to taking medication prior to therapy. Patient states neck is very tight today from fall.   Pt states she has some soreness and pain in the R midback and in her neck. She did not sleep well last night. Her mother in law passed last week and she has been stressed with that and with return to work. She has a migraine today. Her work has been busy and she has been trying to catch up with work from being off last week. She has been doing her exercises this week. Significant improvement in headache and neck pain with treatment today.  Hand dominance: Right  PERTINENT HISTORY:  N/a  PAIN:  Are you  having pain? Yes: NPRS scale: currently 8/10 in the R shoulder blade area; rest of body 5/10 Pain location: now feeling pain and tightness in the shoulder blade area  Pain description: tightness; throbbing; aching R side constant  Aggravating factors: not sure  Relieving factors: medication, chiropractor, massage, dry needling   PRECAUTIONS: None  WEIGHT BEARING RESTRICTIONS: No  FALLS:  Has patient fallen in last 6 months? 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 No large vessel occlusion or proximal hemodynamically significant stenosis. 2. No aneurysm identified.  IMPRESSION: Normal MRI of the brain.   PATIENT SURVEYS:  FOTO 58; predicted 69 07/16/23: 54   POSTURE: rounded shoulders and forward head  PALPATION: TTP R periscapular muscles, thoracic paraspinals, bilat UTs, bilat cervical paraspinals and suboccipitals   CERVICAL ROM:   Active ROM A/PROM (deg) eval AROM  07/16/23  Flexion 30 57  Extension 45 45  Right lateral flexion 39 30  Left lateral flexion 30* 27 pain   Right rotation 44 65   Left rotation 52 63 tight    (Blank rows = not tested)  UPPER EXTREMITY ROM:  Active ROM Right eval Left eval Right  07/16/23  Shoulder flexion WFL pulls into mid back [facility] WNL's   Shoulder extension     Shoulder abduction WFL pulls into mid back [facility] WNL's  Tight R side   Shoulder adduction     Shoulder extension     Shoulder internal rotation T10* T4 T8 pain   Shoulder external rotation T2 T2 WNL's   Elbow flexion     Elbow extension     Wrist flexion     Wrist extension     Wrist ulnar deviation     Wrist radial deviation     Wrist pronation     Wrist supination      (Blank rows = not tested) * pain  UPPER EXTREMITY MMT:  MMT Right eval Left eval Right  07/16/23 Left  07/16/23  Shoulder flexion 3+ 3+  4 5  Shoulder extension 4 5 5 5   Shoulder abduction 3+ 3+ 4 5  Shoulder adduction      Shoulder internal rotation 3 5 4+ 5  Shoulder external rotation 3+ 5 4 5   Middle trapezius      Lower trapezius      Elbow flexion  Elbow extension      Wrist flexion      Wrist extension      Wrist ulnar deviation      Wrist radial deviation      Wrist pronation      Wrist supination      Grip strength       (Blank rows = not tested)  CERVICAL SPECIAL TESTS:  Did not assess  FUNCTIONAL TESTS:  Did not assess   TODAY'S TREATMENT:     [facility] Adult PT Treatment:                                                DATE: 07/30/2023 Therapeutic Exercise: Cat/cow  Seated thoracic extension with 1/2 foam roller at mid back Seated UT variations --> LS stretch Arms crossed overhead + lateral trunk flexion stretch Lateral trunk flexion stretch with side support from orange PB Standing (noodle): Scap squeezes 10x5" L arms x15 W armsx15 Standing facing wall, bracing pelvic into wall: thoracic extension  Pilates [place] with head down --> ball b/w knees --> ankles Self-trigger point massage with theracane Manual Therapy: IASTM/TPR along medial border R scapula   [facility] Adult PT Treatment:                                                DATE: 07/16/23 Therapeutic Exercise: UBE L5; 4 min alt fwd/bwd Standing  Doorway stretch 3 positions x 30 sec x 2  Sit Lateral flexion L/R 20 sec x2  Prone  Prone press up x 5 Cat cow x 5  Child's pose x 5 Axial extension with scap squeeze 5 sec x 10    [facility] Adult PT Treatment:                                                DATE: 07/14/23 Therapeutic Exercise: UBE L5; 4 min alt fwd/bwd Standing  Doorway stretch 3 positions x 30 sec x 2  Prone  Axial extension with scap squeeze 5 sec x 10  Manual Therapy: TPR & STM R periscapular muscles and UT Skilled assessment and palpation for TPDN Trigger Point Dry-Needling  Treatment instructions: Expect mild to  moderate muscle soreness. S/S of pneumothorax if dry needled over a lung field, and to seek immediate medical attention should they occur. Patient verbalized understanding of these instructions and education. Patient Consent Given: Yes Education handout provided: Previously provided Muscles treated: R thoracic and bilat lower cervical periscapular muscles, upper trap, suboccipitals, cervical paraspinals, scaleni Electrical stimulation performed: Yes upper traps Parameters: mAmp current intensity to pt tolerance  Treatment response/outcome: decreased palpable tightness; decreased pain    [facility] Adult PT Treatment:                                                DATE: 07/02/23 Therapeutic Exercise: UBE L5; 4 min alt fwd/bwd Standing  Doorway stretch 3 positions x 30 sec x 2  Scap squeeze with noodle  3 sec x 10  ER bilat with noodle red TB 3 sec x 10 x 2 W with noodle red TB 3 sec x 10 x 2 Horizontal shoulder abd with noodle red TB 2x10 Prone  Axial extension with scap squeeze 5 sec x 10  Goal post 5 sec x 10  Superman 5 sec x 10  Manual Therapy: TPR & STM R periscapular muscles and UT Skilled assessment and palpation for TPDN Trigger Point Dry-Needling  Treatment instructions: Expect mild to moderate muscle soreness. S/S of pneumothorax if dry needled over a lung field, and to seek immediate medical attention should they occur. Patient verbalized understanding of these instructions and education. Patient Consent Given: Yes Education handout provided: Previously provided Muscles treated: R thoracic and lower cervical periscapular muscles, upper trap,  L QL Electrical stimulation performed: Yes Parameters: mAmp current intensity to pt tolerance  Treatment response/outcome: decreased palpable tightness; decreased pain                                                                                                                             PATIENT EDUCATION:  Education details: TPDN, exam  findings, POC, initial HEP Person educated: Patient Education method: Explanation, Demonstration, and Handouts Education comprehension: verbalized understanding, returned demonstration, and needs further education  HOME EXERCISE PROGRAM:  Access Code: [email or web address] URL: [email or web address] Date: 06/08/2023 Prepared by: Corlis Leak  Exercises - Seated Cervical Retraction  - 2 x daily - 7 x weekly - 1-2 sets - 5-10 reps - 10 sec  hold - Seated Scapular Retraction  - 2 x daily - 7 x weekly - 1-2 sets - 10 reps - 10 sec  hold - Standing Backward Shoulder Rolls  - 2 x daily - 7 x weekly - 1 sets - 10 reps - 1-2 sec  hold - Standing Cervical Retraction with Sidebending  - 2 x daily - 7 x weekly - 1 sets - 5-6 reps - 10-15 sec  hold - Supine Diaphragmatic Breathing  - 2 x daily - 7 x weekly - 1 sets - 10 reps - 4-6 sec  hold - Supine Lower Trunk Rotation  - 2 x daily - 7 x weekly - 1 sets - 3-5 reps - 20-30 sec  hold - Shoulder External Rotation and Scapular Retraction with Resistance  - 1 x daily - 7 x weekly - 2 sets - 10 reps - Shoulder W - External Rotation with Resistance  - 1 x daily - 7 x weekly - 2 sets - 10 reps - Standing Shoulder Horizontal Abduction with Resistance  - 1 x daily - 7 x weekly - 2 sets - 10 reps - Standing Bilateral Low Shoulder Row with Anchored Resistance  - 2 x daily - 7 x weekly - 1-3 sets - 10 reps - 2-3 sec  hold - Shoulder extension with resistance - Neutral  - 1 x daily - 7 x weekly - 1-2 sets - 10 reps -  3-5 sec  hold  ASSESSMENT:  CLINICAL IMPRESSION: Exercises continued focusing on mid-thoracic mobility and extension; range improved with repetition and variations of exercise in sitting and standing. Patient instructed in salf-massage/trigger point release with theracane to address continued myofascial tightness along R medial scapular area.   RE-EVAL: Patient initially demonstrated good improvement in symptoms but has experienced a set back in  the past 2 weeks with increased intensity of pain in the R side and shoulder blade area. Patient has a history of tummy tuck surgery ~ 2 years ago and has significant tightness to palpation through the abdomen into the R lateral trunk. She has tightness with lat stretch supine. Patient has limited trunk mobility related to abdominal restrictions. She will benefit from continued treatment to address facial restrictions and improve trunk mobility/ROM. She has continued headaches and increased pain in the R periscapular area with active trigger points. She has continued stretching and postural strengthening as well as adding exercises at home including burpees which may have contributed to the flare up of her symptoms. Patient will benefit from continued treatment to address problems identified.    OBJECTIVE IMPAIRMENTS:  multiple muscles in spasm including UTs, suboccipitals, and periscapular muscles likely related to her headaches and dizziness. Pt demos decreased cervical ROM with R>L rotator cuff weakness and decreased postural stability decreased activity tolerance, decreased endurance, decreased mobility, decreased ROM, decreased strength, increased fascial restrictions, increased muscle spasms, impaired UE functional use, postural dysfunction, and pain.    GOALS: Goals reviewed with patient? Yes  SHORT TERM GOALS: Target date: 06/26/2023   Pt will be ind with initial HEP Baseline:  Goal status: MET  2.  Pt will report >/=50% improvement in headaches Baseline:  Goal status: met  3.  Pt will be ind with maintaining postural stability for working at her desk Baseline:  Goal status: MET    LONG TERM GOALS: Target date: 09/24/2023  Pt will be ind with management and progression of HEP Baseline:  Goal status: on going   2.  Pt will be able to demo at least 5/5 periscapular muscles for improved postural stability with no pain  Baseline:  Goal status: on going - revise   3.  Pt will  report >/=75% improvement in pain Baseline:  Goal status: on going   4.  Pt will have increased FOTO score to >/=69 Baseline: 52 07/16/23: 54 Goal status: on going     PLAN:  PT FREQUENCY: 2x/week  PT DURATION: 8 weeks  PLANNED INTERVENTIONS: Therapeutic exercises, Therapeutic activity, Neuromuscular re-education, Balance training, Gait training, Patient/Family education, Self Care, Joint mobilization, Vestibular training, Aquatic Therapy, Dry Needling, Electrical stimulation, Spinal mobilization, Cryotherapy, Moist heat, Taping, Traction, Ionotophoresis 4mg /ml Dexamethasone, Manual therapy, and Re-evaluation  PLAN FOR NEXT SESSION: Initiate HEP for midback strengthening and neck stretching. Work on shoulder/rotator cuff strengthening as tolerated. Manual work/TPDN as indicated.    Sanjuana Mae, PTA 07/30/2023, 5:07 PM

## 2023-08-04 ENCOUNTER — Encounter: Payer: Self-pay | Admitting: Family Medicine

## 2023-08-05 ENCOUNTER — Telehealth: Payer: Self-pay | Admitting: Psychiatry

## 2023-08-05 ENCOUNTER — Other Ambulatory Visit: Payer: Self-pay

## 2023-08-05 ENCOUNTER — Other Ambulatory Visit: Payer: Self-pay | Admitting: Psychiatry

## 2023-08-05 MED ORDER — NORTRIPTYLINE HCL 25 MG PO CAPS: 25.0000 mg | ORAL_CAPSULE | Freq: Every day | ORAL | 3 refills | Status: DC

## 2023-08-05 NOTE — Telephone Encounter (Signed)
Call to patient, she states that since seeing Dr. Delena Bali and starting the nortriptyline and ubrelvy, her headaches are not better. Upon further discussion, Patient has been taking 25 mg nortriptyline at bedtime, 2 100 mg ubrelvy daily as well as 2 10 mg maxalt daily. Advised I would discuss with Dr. Delena Bali as those may be causing rebound headaches.  Per discussion with Dr. Delena Bali, Dr. Delena Bali advises to stop Maxalt, continue nortriptyline and continued ubrelvy as needed for rescue. Will inform patient she may experience a few days of rebound headaches while adjusting to medications. Advised can take up to 4 weeks for nortriptyline to work effectively and if this doesn't work Dr. Delena Bali will try for Botox approval  Call to patient and advised of Dr. Delena Bali advice and she verbalized understanding and appreciative of call.

## 2023-08-05 NOTE — Telephone Encounter (Signed)
Pt said,Ubrogepant (UBRELVY) 100 MG TABS and  Nortriptyline HCl 25 mg Oral Daily at bedtime is not working have had a headache everyday. Would like a call back.

## 2023-08-11 ENCOUNTER — Encounter: Payer: Self-pay | Admitting: Physical Therapy

## 2023-08-11 ENCOUNTER — Ambulatory Visit: Payer: Managed Care, Other (non HMO) | Admitting: Physical Therapy

## 2023-08-11 DIAGNOSIS — M546 Pain in thoracic spine: Secondary | ICD-10-CM

## 2023-08-11 DIAGNOSIS — M6281 Muscle weakness (generalized): Secondary | ICD-10-CM

## 2023-08-11 DIAGNOSIS — M542 Cervicalgia: Secondary | ICD-10-CM | POA: Diagnosis not present

## 2023-08-11 DIAGNOSIS — R293 Abnormal posture: Secondary | ICD-10-CM

## 2023-08-11 NOTE — Therapy (Signed)
OUTPATIENT PHYSICAL THERAPY CERVICAL TREATMENT   Patient Name: Anna Robinson MRN: [id number] DOB:08-18-1971, 52 y.o., female Today's Date: 08/11/2023  END OF SESSION:  PT End of Session - 08/11/23 1655     Visit Number 13    Number of Visits 24    Date for PT Re-Evaluation 09/24/23    PT Start Time 1615    PT Stop Time 1655    PT Time Calculation (min) 40 min    Activity Tolerance Patient tolerated treatment well    Behavior During Therapy WFL for tasks assessed/performed               Past Medical History:  Diagnosis Date   Allergy    Anxiety    Constipation    GERD (gastroesophageal reflux disease)    IBS (irritable bowel syndrome)    PONV (postoperative nausea and vomiting)    Seasonal allergies    Past Surgical History:  Procedure Laterality Date   COLONOSCOPY     ~10 yrs- normal per pt    TOTAL ABDOMINAL HYSTERECTOMY     WISDOM TOOTH EXTRACTION     age 14-17   Patient Active Problem List   Diagnosis Date Noted   Cervical neck pain with evidence of disc disease 05/06/2023   Migraine without status migrainosus, not intractable 04/13/2023   Nausea 04/13/2023   Muscle spasms of neck 04/13/2023   Post-menopausal 04/10/2023   DDD (degenerative disc disease), lumbar 12/01/2022   DDD (degenerative disc disease), 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   Mouth sores 12/01/2022   Lower abdominal pain 12/01/2022   Acute bilateral low back pain without sciatica 12/01/2022   Slow transit constipation 12/01/2022   Anxiety 05/30/2022   Chest pain 02/24/2022   Overweight (BMI 25.0-29.9) 02/24/2022   Gastroesophageal reflux disease without esophagitis 02/24/2022   Sinus arrhythmia seen on electrocardiogram 02/24/2022   Sinus bradycardia 02/24/2022   Class 1 obesity due to excess calories without serious comorbidity with body mass index (BMI) of 31.0 to 31.9 in adult 02/19/2021   Fibrocystic changes of left breast 05/15/2020   Class 1 obesity due to excess calories without  serious comorbidity with body mass index (BMI) of 30.0 to 30.9 in adult 02/01/2020   Right ovarian cyst 06/02/2019   Elevated liver enzymes 02/14/2019   Vitamin D insufficiency 01/26/2019   Elevated fasting glucose 01/26/2019   Dyslipidemia 01/26/2019   Epigastric pain 02/15/2018   Fatigue 01/15/2018   Muscle cramps 03/02/2015    PCP: Jomarie Longs, [name]  REFERRING PROVIDER: Jomarie Longs, [name]  REFERRING DIAG: [phone] (ICD-10-CM) - Muscle spasms of neck M50.90 (ICD-10-CM) - Cervical neck pain with evidence of disc disease R51.9 (ICD-10-CM) - Frequent headaches  THERAPY DIAG:  Cervicalgia  Pain in thoracic spine  Muscle weakness (generalized)  Abnormal posture  Rationale for Evaluation and Treatment: Rehabilitation  ONSET DATE: January 2024  SUBJECTIVE:  SUBJECTIVE STATEMENT: Patient reports her back feels "terrible". She continues to have pain in Rt scapular area up to her neck. She is still having headaches and returns to neurologist for possible botox treatments  Pt states she has some soreness and pain in the R midback and in her neck. She did not sleep well last night. Her mother in law passed last week and she has been stressed with that and with return to work. She has a migraine today. Her work has been busy and she has been trying to catch up with work from being off last week. She has been doing her exercises this week. Significant improvement in headache and neck pain with treatment today.  Hand dominance: Right  PERTINENT HISTORY:  N/a  PAIN:  Are you having pain? Yes: NPRS scale: currently 8/10 in the R shoulder blade area Pain location: now feeling pain and tightness in the shoulder blade area  Pain description: tightness; throbbing; aching R side constant   Aggravating factors: not sure  Relieving factors: medication, chiropractor, massage, dry needling   PRECAUTIONS: None  WEIGHT BEARING RESTRICTIONS: No  FALLS:  Has patient fallen in last 6 months? 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 No large vessel occlusion or proximal hemodynamically significant stenosis. 2. No aneurysm identified.  IMPRESSION: Normal MRI of the brain.   PATIENT SURVEYS:  FOTO 58; predicted 69 07/16/23: 54   POSTURE: rounded shoulders and forward head  PALPATION: TTP R periscapular muscles, thoracic paraspinals, bilat UTs, bilat cervical paraspinals and suboccipitals   CERVICAL ROM:   Active ROM A/PROM (deg) eval AROM  07/16/23  Flexion 30 57  Extension 45 45  Right lateral flexion 39 30  Left lateral flexion 30* 27 pain   Right rotation 44 65   Left rotation 52 63 tight    (Blank rows = not tested)  UPPER EXTREMITY ROM:  Active ROM Right eval Left eval Right  07/16/23  Shoulder flexion WFL pulls into mid back [facility] WNL's   Shoulder extension     Shoulder abduction WFL pulls into mid back [facility] WNL's  Tight R side   Shoulder adduction     Shoulder extension     Shoulder internal rotation T10* T4 T8 pain   Shoulder external rotation T2 T2 WNL's   Elbow flexion     Elbow extension     Wrist flexion     Wrist extension     Wrist ulnar deviation     Wrist radial deviation     Wrist pronation     Wrist supination      (Blank rows = not tested) * pain  UPPER EXTREMITY MMT:  MMT Right eval Left eval Right  07/16/23 Left  07/16/23  Shoulder flexion 3+ 3+ 4 5  Shoulder extension 4 5 5 5   Shoulder abduction 3+ 3+ 4 5  Shoulder adduction      Shoulder internal rotation 3 5 4+ 5  Shoulder external rotation 3+ 5 4 5   Middle trapezius      Lower trapezius      Elbow flexion       Elbow extension      Wrist flexion      Wrist extension      Wrist ulnar deviation      Wrist radial deviation  Wrist pronation      Wrist supination      Grip strength       (Blank rows = not tested)  CERVICAL SPECIAL TESTS:  Did not assess  FUNCTIONAL TESTS:  Did not assess   TODAY'S TREATMENT:     [facility] Adult PT Treatment:                                                DATE: 08/11/23 Therapeutic Exercise: UBE L5 x 4 min alt fwd/bkwd Cat/cow Doorway stretch 3 positions 2 x 30 sec Prone W x 10 Standing with noodle: L arms, W arms, scap squeeze Manual Therapy: TPR & STM Rt thoracic paraspinals, scapular musculature Skilled assessment and palpation for TPDN Trigger Point Dry-Needling  Treatment instructions: Expect mild to moderate muscle soreness. S/S of pneumothorax if dry needled over a lung field, and to seek immediate medical attention should they occur. Patient verbalized understanding of these instructions and education. Patient Consent Given: Yes Education handout provided: Previously provided Muscles treated: Rt thoracic paraspinals, Rt periscapular musculature, UT Electrical stimulation performed: No Parameters: NA Treatment response/outcome: decreased palpable tightness; decreased pain   [facility] Adult PT Treatment:                                                DATE: 07/30/2023 Therapeutic Exercise: Cat/cow  Seated thoracic extension with 1/2 foam roller at mid back Seated UT variations --> LS stretch Arms crossed overhead + lateral trunk flexion stretch Lateral trunk flexion stretch with side support from orange PB Standing (noodle): Scap squeezes 10x5" L arms x15 W armsx15 Standing facing wall, bracing pelvic into wall: thoracic extension  Pilates [place] with head down --> ball b/w knees --> ankles Self-trigger point massage with theracane Manual Therapy: IASTM/TPR along medial border R scapula   [facility] Adult PT Treatment:                                                 DATE: 07/16/23 Therapeutic Exercise: UBE L5; 4 min alt fwd/bwd Standing  Doorway stretch 3 positions x 30 sec x 2  Sit Lateral flexion L/R 20 sec x2  Prone  Prone press up x 5 Cat cow x 5  Child's pose x 5 Axial extension with scap squeeze 5 sec x 10    [facility] Adult PT Treatment:                                                DATE: 07/14/23 Therapeutic Exercise: UBE L5; 4 min alt fwd/bwd Standing  Doorway stretch 3 positions x 30 sec x 2  Prone  Axial extension with scap squeeze 5 sec x 10  Manual Therapy: TPR & STM R periscapular muscles and UT Skilled assessment and palpation for TPDN Trigger Point Dry-Needling  Treatment instructions: Expect mild to moderate muscle soreness. S/S of pneumothorax if dry needled over a lung field, and to seek immediate medical attention should they  occur. Patient verbalized understanding of these instructions and education. Patient Consent Given: Yes Education handout provided: Previously provided Muscles treated: R thoracic and bilat lower cervical periscapular muscles, upper trap, suboccipitals, cervical paraspinals, scaleni Electrical stimulation performed: Yes upper traps Parameters: mAmp current intensity to pt tolerance  Treatment response/outcome: decreased palpable tightness; decreased pain                                                                                                                             PATIENT EDUCATION:  Education details: TPDN, exam findings, POC, initial HEP Person educated: Patient Education method: Explanation, Demonstration, and Handouts Education comprehension: verbalized understanding, returned demonstration, and needs further education  HOME EXERCISE PROGRAM:  Access Code: [email or web address] URL: [email or web address] Date: 06/08/2023 Prepared by: Corlis Leak  Exercises - Seated Cervical Retraction  - 2 x daily - 7 x weekly - 1-2 sets - 5-10 reps - 10 sec  hold - Seated  Scapular Retraction  - 2 x daily - 7 x weekly - 1-2 sets - 10 reps - 10 sec  hold - Standing Backward Shoulder Rolls  - 2 x daily - 7 x weekly - 1 sets - 10 reps - 1-2 sec  hold - Standing Cervical Retraction with Sidebending  - 2 x daily - 7 x weekly - 1 sets - 5-6 reps - 10-15 sec  hold - Supine Diaphragmatic Breathing  - 2 x daily - 7 x weekly - 1 sets - 10 reps - 4-6 sec  hold - Supine Lower Trunk Rotation  - 2 x daily - 7 x weekly - 1 sets - 3-5 reps - 20-30 sec  hold - Shoulder External Rotation and Scapular Retraction with Resistance  - 1 x daily - 7 x weekly - 2 sets - 10 reps - Shoulder W - External Rotation with Resistance  - 1 x daily - 7 x weekly - 2 sets - 10 reps - Standing Shoulder Horizontal Abduction with Resistance  - 1 x daily - 7 x weekly - 2 sets - 10 reps - Standing Bilateral Low Shoulder Row with Anchored Resistance  - 2 x daily - 7 x weekly - 1-3 sets - 10 reps - 2-3 sec  hold - Shoulder extension with resistance - Neutral  - 1 x daily - 7 x weekly - 1-2 sets - 10 reps - 3-5 sec  hold  ASSESSMENT:  CLINICAL IMPRESSION: Pt continues to respond well to dry needling and manual work. She states that after PT treatment she feels good for a few days before things tighten back up. PT emphasized importance of performing thoracic mobility at home, pt verbalizes understanding   RE-EVAL: Patient initially demonstrated good improvement in symptoms but has experienced a set back in the past 2 weeks with increased intensity of pain in the R side and shoulder blade area. Patient has a history of tummy tuck surgery ~ 2 years ago and has significant tightness to palpation through  the abdomen into the R lateral trunk. She has tightness with lat stretch supine. Patient has limited trunk mobility related to abdominal restrictions. She will benefit from continued treatment to address facial restrictions and improve trunk mobility/ROM. She has continued headaches and increased pain in the R  periscapular area with active trigger points. She has continued stretching and postural strengthening as well as adding exercises at home including burpees which may have contributed to the flare up of her symptoms. Patient will benefit from continued treatment to address problems identified.    OBJECTIVE IMPAIRMENTS:  multiple muscles in spasm including UTs, suboccipitals, and periscapular muscles likely related to her headaches and dizziness. Pt demos decreased cervical ROM with R>L rotator cuff weakness and decreased postural stability decreased activity tolerance, decreased endurance, decreased mobility, decreased ROM, decreased strength, increased fascial restrictions, increased muscle spasms, impaired UE functional use, postural dysfunction, and pain.    GOALS: Goals reviewed with patient? Yes  SHORT TERM GOALS: Target date: 06/26/2023   Pt will be ind with initial HEP Baseline:  Goal status: MET  2.  Pt will report >/=50% improvement in headaches Baseline:  Goal status: met  3.  Pt will be ind with maintaining postural stability for working at her desk Baseline:  Goal status: MET    LONG TERM GOALS: Target date: 09/24/2023  Pt will be ind with management and progression of HEP Baseline:  Goal status: on going   2.  Pt will be able to demo at least 5/5 periscapular muscles for improved postural stability with no pain  Baseline:  Goal status: on going - revise   3.  Pt will report >/=75% improvement in pain Baseline:  Goal status: on going   4.  Pt will have increased FOTO score to >/=69 Baseline: 52 07/16/23: 54 Goal status: on going     PLAN:  PT FREQUENCY: 2x/week  PT DURATION: 8 weeks  PLANNED INTERVENTIONS: Therapeutic exercises, Therapeutic activity, Neuromuscular re-education, Balance training, Gait training, Patient/Family education, Self Care, Joint mobilization, Vestibular training, Aquatic Therapy, Dry Needling, Electrical stimulation, Spinal  mobilization, Cryotherapy, Moist heat, Taping, Traction, Ionotophoresis 4mg /ml Dexamethasone, Manual therapy, and Re-evaluation  PLAN FOR NEXT SESSION:Thoracic mobility, Work on shoulder/rotator cuff strengthening as tolerated. Manual work/TPDN as indicated.    Rilya Longo, PT 08/11/2023, 4:56 PM

## 2023-08-18 ENCOUNTER — Ambulatory Visit: Payer: Managed Care, Other (non HMO) | Attending: Physician Assistant | Admitting: Physical Therapy

## 2023-08-18 ENCOUNTER — Encounter: Payer: Self-pay | Admitting: Physical Therapy

## 2023-08-18 DIAGNOSIS — M542 Cervicalgia: Secondary | ICD-10-CM

## 2023-08-18 DIAGNOSIS — M546 Pain in thoracic spine: Secondary | ICD-10-CM

## 2023-08-18 DIAGNOSIS — M6281 Muscle weakness (generalized): Secondary | ICD-10-CM

## 2023-08-18 DIAGNOSIS — R293 Abnormal posture: Secondary | ICD-10-CM

## 2023-08-18 NOTE — Therapy (Incomplete)
OUTPATIENT PHYSICAL THERAPY CERVICAL TREATMENT   Patient Name: Anna Robinson MRN: [id number] DOB:[birth year], 52 y.o., female Today's Date: 08/18/2023  END OF SESSION:      Past Medical History:  Diagnosis Date   Allergy    Anxiety    Constipation    GERD (gastroesophageal reflux disease)    IBS (irritable bowel syndrome)    PONV (postoperative nausea and vomiting)    Seasonal allergies    Past Surgical History:  Procedure Laterality Date   COLONOSCOPY     ~10 yrs- normal per pt    TOTAL ABDOMINAL HYSTERECTOMY     WISDOM TOOTH EXTRACTION     age 21-17   Patient Active Problem List   Diagnosis Date Noted   Cervical neck pain with evidence of disc disease 05/06/2023   Migraine without status migrainosus, not intractable 04/13/2023   Nausea 04/13/2023   Muscle spasms of neck 04/13/2023   Post-menopausal 04/10/2023   DDD (degenerative disc disease), lumbar 12/01/2022   DDD (degenerative disc disease), 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   Mouth sores 12/01/2022   Lower abdominal pain 12/01/2022   Acute bilateral low back pain without sciatica 12/01/2022   Slow transit constipation 12/01/2022   Anxiety 05/30/2022   Chest pain 02/24/2022   Overweight (BMI 25.0-29.9) 02/24/2022   Gastroesophageal reflux disease without esophagitis 02/24/2022   Sinus arrhythmia seen on electrocardiogram 02/24/2022   Sinus bradycardia 02/24/2022   Class 1 obesity due to excess calories without serious comorbidity with body mass index (BMI) of 31.0 to 31.9 in adult 02/19/2021   Fibrocystic changes of left breast 05/15/2020   Class 1 obesity due to excess calories without serious comorbidity with body mass index (BMI) of 30.0 to 30.9 in adult 02/01/2020   Right ovarian cyst 06/02/2019   Elevated liver enzymes 02/14/2019   Vitamin D insufficiency 01/26/2019   Elevated fasting glucose 01/26/2019   Dyslipidemia 01/26/2019   Epigastric pain 02/15/2018   Fatigue 01/15/2018   Muscle cramps 03/02/2015     PCP: Jomarie Longs, [name]  REFERRING PROVIDER: Jomarie Longs, [name]  REFERRING DIAG: [id number] (ICD-10-CM) - Muscle spasms of neck M50.90 (ICD-10-CM) - Cervical neck pain with evidence of disc disease R51.9 (ICD-10-CM) - Frequent headaches  THERAPY DIAG:  No diagnosis found.  Rationale for Evaluation and Treatment: Rehabilitation  ONSET DATE: January 2024  SUBJECTIVE:  SUBJECTIVE STATEMENT: Patient reports her back feels "terrible". She continues to have pain in Rt scapular area up to her neck. She is still having headaches and returns to neurologist for possible botox treatments  Pt states she has some soreness and pain in the R midback and in her neck. She did not sleep well last night. Her mother in law passed last week and she has been stressed with that and with return to work. She has a migraine today. Her work has been busy and she has been trying to catch up with work from being off last week. She has been doing her exercises this week. Significant improvement in headache and neck pain with treatment today.  Hand dominance: Right  PERTINENT HISTORY:  N/a  PAIN:  Are you having pain? Yes: NPRS scale: currently 8/10 in the R shoulder blade area Pain location: now feeling pain and tightness in the shoulder blade area  Pain description: tightness; throbbing; aching R side constant  Aggravating factors: not sure  Relieving factors: medication, chiropractor, massage, dry needling   PRECAUTIONS: None  WEIGHT BEARING RESTRICTIONS: No  FALLS:  Has patient fallen in last 6 months? 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 No large vessel occlusion or proximal hemodynamically significant stenosis. 2. No aneurysm identified.  IMPRESSION: Normal MRI of the brain.   PATIENT SURVEYS:  FOTO 58; predicted 69 07/16/23: 54   POSTURE: rounded shoulders and forward head  PALPATION: TTP R periscapular muscles, thoracic paraspinals, bilat UTs, bilat cervical paraspinals and suboccipitals   CERVICAL ROM:   Active ROM A/PROM (deg) eval AROM  07/16/23  Flexion 30 57  Extension 45 45  Right lateral flexion 39 30  Left lateral flexion 30* 27 pain   Right rotation 44 65   Left rotation 52 63 tight    (Blank rows = not tested)  UPPER EXTREMITY ROM:  Active ROM Right eval Left eval Right  07/16/23  Shoulder flexion WFL pulls into mid back [facility] WNL's   Shoulder extension     Shoulder abduction WFL pulls into mid back [facility] WNL's  Tight R side   Shoulder adduction     Shoulder extension     Shoulder internal rotation T10* T4 T8 pain   Shoulder external rotation T2 T2 WNL's   Elbow flexion     Elbow extension     Wrist flexion     Wrist extension     Wrist ulnar deviation     Wrist radial deviation     Wrist pronation     Wrist supination      (Blank rows = not tested) * pain  UPPER EXTREMITY MMT:  MMT Right eval Left eval Right  07/16/23 Left  07/16/23  Shoulder flexion 3+ 3+ 4 5  Shoulder extension 4 5 5 5   Shoulder abduction 3+ 3+ 4 5  Shoulder adduction      Shoulder internal rotation 3 5 4+ 5  Shoulder external rotation 3+ 5 4 5   Middle trapezius      Lower trapezius      Elbow flexion      Elbow extension      Wrist flexion      Wrist extension      Wrist ulnar deviation      Wrist radial deviation      Wrist  pronation      Wrist supination      Grip strength       (Blank rows = not tested)  CERVICAL SPECIAL TESTS:  Did not assess  FUNCTIONAL TESTS:  Did not assess   TODAY'S TREATMENT:     [facility] Adult PT Treatment:                                                 DATE: 08/18/23 Therapeutic Exercise: *** Manual Therapy: *** Neuromuscular re-ed: *** Therapeutic Activity: *** Modalities: *** Self Care: ***  [facility] Adult PT Treatment:                                                DATE: 08/11/23 Therapeutic Exercise: UBE L5 x 4 min alt fwd/bkwd Cat/cow Doorway stretch 3 positions 2 x 30 sec Prone W x 10 Standing with noodle: L arms, W arms, scap squeeze Manual Therapy: TPR & STM Rt thoracic paraspinals, scapular musculature Skilled assessment and palpation for TPDN Trigger Point Dry-Needling  Treatment instructions: Expect mild to moderate muscle soreness. S/S of pneumothorax if dry needled over a lung field, and to seek immediate medical attention should they occur. Patient verbalized understanding of these instructions and education. Patient Consent Given: Yes Education handout provided: Previously provided Muscles treated: Rt thoracic paraspinals, Rt periscapular musculature, UT Electrical stimulation performed: No Parameters: NA Treatment response/outcome: decreased palpable tightness; decreased pain   [facility] Adult PT Treatment:                                                DATE: 07/30/2023 Therapeutic Exercise: Cat/cow  Seated thoracic extension with 1/2 foam roller at mid back Seated UT variations --> LS stretch Arms crossed overhead + lateral trunk flexion stretch Lateral trunk flexion stretch with side support from orange PB Standing (noodle): Scap squeezes 10x5" L arms x15 W armsx15 Standing facing wall, bracing pelvic into wall: thoracic extension  Pilates [place] with head down --> ball b/w knees --> ankles Self-trigger point massage with theracane Manual Therapy: IASTM/TPR along medial border R scapula   [facility] Adult PT Treatment:                                                DATE: 07/16/23 Therapeutic Exercise: UBE L5; 4 min alt fwd/bwd Standing  Doorway stretch 3 positions x 30 sec x 2  Sit Lateral flexion L/R 20 sec  x2  Prone  Prone press up x 5 Cat cow x 5  Child's pose x 5 Axial extension with scap squeeze 5 sec x 10    [facility] Adult PT Treatment:                                                DATE: 07/14/23 Therapeutic Exercise: UBE L5; 4 min alt  fwd/bwd Standing  Doorway stretch 3 positions x 30 sec x 2  Prone  Axial extension with scap squeeze 5 sec x 10  Manual Therapy: TPR & STM R periscapular muscles and UT Skilled assessment and palpation for TPDN Trigger Point Dry-Needling  Treatment instructions: Expect mild to moderate muscle soreness. S/S of pneumothorax if dry needled over a lung field, and to seek immediate medical attention should they occur. Patient verbalized understanding of these instructions and education. Patient Consent Given: Yes Education handout provided: Previously provided Muscles treated: R thoracic and bilat lower cervical periscapular muscles, upper trap, suboccipitals, cervical paraspinals, scaleni Electrical stimulation performed: Yes upper traps Parameters: mAmp current intensity to pt tolerance  Treatment response/outcome: decreased palpable tightness; decreased pain                                                                                                                             PATIENT EDUCATION:  Education details: TPDN, exam findings, POC, initial HEP Person educated: Patient Education method: Explanation, Demonstration, and Handouts Education comprehension: verbalized understanding, returned demonstration, and needs further education  HOME EXERCISE PROGRAM:  Access Code: [email or web address] URL: [email or web address] Date: 06/08/2023 Prepared by: Corlis Leak  Exercises - Seated Cervical Retraction  - 2 x daily - 7 x weekly - 1-2 sets - 5-10 reps - 10 sec  hold - Seated Scapular Retraction  - 2 x daily - 7 x weekly - 1-2 sets - 10 reps - 10 sec  hold - Standing Backward Shoulder Rolls  - 2 x daily - 7 x weekly - 1 sets - 10 reps - 1-2 sec   hold - Standing Cervical Retraction with Sidebending  - 2 x daily - 7 x weekly - 1 sets - 5-6 reps - 10-15 sec  hold - Supine Diaphragmatic Breathing  - 2 x daily - 7 x weekly - 1 sets - 10 reps - 4-6 sec  hold - Supine Lower Trunk Rotation  - 2 x daily - 7 x weekly - 1 sets - 3-5 reps - 20-30 sec  hold - Shoulder External Rotation and Scapular Retraction with Resistance  - 1 x daily - 7 x weekly - 2 sets - 10 reps - Shoulder W - External Rotation with Resistance  - 1 x daily - 7 x weekly - 2 sets - 10 reps - Standing Shoulder Horizontal Abduction with Resistance  - 1 x daily - 7 x weekly - 2 sets - 10 reps - Standing Bilateral Low Shoulder Row with Anchored Resistance  - 2 x daily - 7 x weekly - 1-3 sets - 10 reps - 2-3 sec  hold - Shoulder extension with resistance - Neutral  - 1 x daily - 7 x weekly - 1-2 sets - 10 reps - 3-5 sec  hold  ASSESSMENT:  CLINICAL IMPRESSION: Pt continues to respond well to dry needling and manual work. She states that after PT treatment she feels good for a few  days before things tighten back up. PT emphasized importance of performing thoracic mobility at home, pt verbalizes understanding   RE-EVAL: Patient initially demonstrated good improvement in symptoms but has experienced a set back in the past 2 weeks with increased intensity of pain in the R side and shoulder blade area. Patient has a history of tummy tuck surgery ~ 2 years ago and has significant tightness to palpation through the abdomen into the R lateral trunk. She has tightness with lat stretch supine. Patient has limited trunk mobility related to abdominal restrictions. She will benefit from continued treatment to address facial restrictions and improve trunk mobility/ROM. She has continued headaches and increased pain in the R periscapular area with active trigger points. She has continued stretching and postural strengthening as well as adding exercises at home including burpees which may have  contributed to the flare up of her symptoms. Patient will benefit from continued treatment to address problems identified.    OBJECTIVE IMPAIRMENTS:  multiple muscles in spasm including UTs, suboccipitals, and periscapular muscles likely related to her headaches and dizziness. Pt demos decreased cervical ROM with R>L rotator cuff weakness and decreased postural stability decreased activity tolerance, decreased endurance, decreased mobility, decreased ROM, decreased strength, increased fascial restrictions, increased muscle spasms, impaired UE functional use, postural dysfunction, and pain.    GOALS: Goals reviewed with patient? Yes  SHORT TERM GOALS: Target date: 06/26/2023   Pt will be ind with initial HEP Baseline:  Goal status: MET  2.  Pt will report >/=50% improvement in headaches Baseline:  Goal status: met  3.  Pt will be ind with maintaining postural stability for working at her desk Baseline:  Goal status: MET    LONG TERM GOALS: Target date: 09/24/2023  Pt will be ind with management and progression of HEP Baseline:  Goal status: on going   2.  Pt will be able to demo at least 5/5 periscapular muscles for improved postural stability with no pain  Baseline:  Goal status: on going - revise   3.  Pt will report >/=75% improvement in pain Baseline:  Goal status: on going   4.  Pt will have increased FOTO score to >/=69 Baseline: 52 07/16/23: 54 Goal status: on going     PLAN:  PT FREQUENCY: 2x/week  PT DURATION: 8 weeks  PLANNED INTERVENTIONS: Therapeutic exercises, Therapeutic activity, Neuromuscular re-education, Balance training, Gait training, Patient/Family education, Self Care, Joint mobilization, Vestibular training, Aquatic Therapy, Dry Needling, Electrical stimulation, Spinal mobilization, Cryotherapy, Moist heat, Taping, Traction, Ionotophoresis 4mg /ml Dexamethasone, Manual therapy, and Re-evaluation  PLAN FOR NEXT SESSION:Thoracic mobility, Work on  shoulder/rotator cuff strengthening as tolerated. Manual work/TPDN as indicated.   Letitia Libra, PT, DPT, ATC 08/18/23 9:29 AM

## 2023-08-18 NOTE — Therapy (Unsigned)
OUTPATIENT PHYSICAL THERAPY CERVICAL TREATMENT   Patient Name: Anna Robinson MRN: [id number] DOB:07/27/1971, 52 y.o., female Today's Date: 08/18/2023  END OF SESSION:  PT End of Session - 08/18/23 1650     Visit Number 14    Number of Visits 24    Date for PT Re-Evaluation 09/24/23    PT Start Time 1613    PT Stop Time 1651    PT Time Calculation (min) 38 min    Activity Tolerance Patient tolerated treatment well    Behavior During Therapy WFL for tasks assessed/performed                Past Medical History:  Diagnosis Date   Allergy    Anxiety    Constipation    GERD (gastroesophageal reflux disease)    IBS (irritable bowel syndrome)    PONV (postoperative nausea and vomiting)    Seasonal allergies    Past Surgical History:  Procedure Laterality Date   COLONOSCOPY     ~10 yrs- normal per pt    TOTAL ABDOMINAL HYSTERECTOMY     WISDOM TOOTH EXTRACTION     age 33-17   Patient Active Problem List   Diagnosis Date Noted   Cervical neck pain with evidence of disc disease 05/06/2023   Migraine without status migrainosus, not intractable 04/13/2023   Nausea 04/13/2023   Muscle spasms of neck 04/13/2023   Post-menopausal 04/10/2023   DDD (degenerative disc disease), lumbar 12/01/2022   DDD (degenerative disc disease), 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   Mouth sores 12/01/2022   Lower abdominal pain 12/01/2022   Acute bilateral low back pain without sciatica 12/01/2022   Slow transit constipation 12/01/2022   Anxiety 05/30/2022   Chest pain 02/24/2022   Overweight (BMI 25.0-29.9) 02/24/2022   Gastroesophageal reflux disease without esophagitis 02/24/2022   Sinus arrhythmia seen on electrocardiogram 02/24/2022   Sinus bradycardia 02/24/2022   Class 1 obesity due to excess calories without serious comorbidity with body mass index (BMI) of 31.0 to 31.9 in adult 02/19/2021   Fibrocystic changes of left breast 05/15/2020   Class 1 obesity due to excess calories without  serious comorbidity with body mass index (BMI) of 30.0 to 30.9 in adult 02/01/2020   Right ovarian cyst 06/02/2019   Elevated liver enzymes 02/14/2019   Vitamin D insufficiency 01/26/2019   Elevated fasting glucose 01/26/2019   Dyslipidemia 01/26/2019   Epigastric pain 02/15/2018   Fatigue 01/15/2018   Muscle cramps 03/02/2015    PCP: Jomarie Longs, [name]  REFERRING PROVIDER: Jomarie Longs, [name]  REFERRING DIAG: [phone] (ICD-10-CM) - Muscle spasms of neck M50.90 (ICD-10-CM) - Cervical neck pain with evidence of disc disease R51.9 (ICD-10-CM) - Frequent headaches  THERAPY DIAG:  Cervicalgia  Pain in thoracic spine  Muscle weakness (generalized)  Abnormal posture  Rationale for Evaluation and Treatment: Rehabilitation  ONSET DATE: January 2024  SUBJECTIVE:  SUBJECTIVE STATEMENT: Patient reports she feels "much better" than last visit. She states needling and interventions were helpful in her getting through almost a whole week before pain returned. She states she only had 1 headache since last visit  Pt states she has some soreness and pain in the R midback and in her neck. She did not sleep well last night. Her mother in law passed last week and she has been stressed with that and with return to work. She has a migraine today. Her work has been busy and she has been trying to catch up with work from being off last week. She has been doing her exercises this week. Significant improvement in headache and neck pain with treatment today.  Hand dominance: Right  PERTINENT HISTORY:  N/a  PAIN:  Are you having pain? Yes: NPRS scale: currently 3/10 in the R shoulder blade area Pain location: now feeling pain and tightness in the shoulder blade area  Pain description: tightness;  throbbing; aching R side constant  Aggravating factors: not sure  Relieving factors: medication, chiropractor, massage, dry needling   PRECAUTIONS: None  WEIGHT BEARING RESTRICTIONS: No  FALLS:  Has patient fallen in last 6 months? 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 No large vessel occlusion or proximal hemodynamically significant stenosis. 2. No aneurysm identified.  IMPRESSION: Normal MRI of the brain.   PATIENT SURVEYS:  FOTO 58; predicted 69 07/16/23: 54   POSTURE: rounded shoulders and forward head  PALPATION: TTP R periscapular muscles, thoracic paraspinals, bilat UTs, bilat cervical paraspinals and suboccipitals   CERVICAL ROM:   Active ROM A/PROM (deg) eval AROM  07/16/23 AROM 08/18/23  Flexion 30 57 57  Extension 45 45 50  Right lateral flexion 39 30 41  Left lateral flexion 30* 27 pain  41  Right rotation 44 65  60  Left rotation 52 63 tight  70   (Blank rows = not tested)  UPPER EXTREMITY ROM:  Active ROM Right eval Left eval Right  07/16/23  Shoulder flexion WFL pulls into mid back [facility] WNL's   Shoulder extension     Shoulder abduction WFL pulls into mid back [facility] WNL's  Tight R side   Shoulder adduction     Shoulder extension     Shoulder internal rotation T10* T4 T8 pain   Shoulder external rotation T2 T2 WNL's   Elbow flexion     Elbow extension     Wrist flexion     Wrist extension     Wrist ulnar deviation     Wrist radial deviation     Wrist pronation     Wrist supination      (Blank rows = not tested) * pain  UPPER EXTREMITY MMT:  MMT Right eval Left eval Right  07/16/23 Left  07/16/23  Shoulder flexion 3+ 3+ 4 5  Shoulder extension 4 5 5 5   Shoulder abduction 3+ 3+ 4 5  Shoulder adduction      Shoulder internal rotation 3 5 4+ 5  Shoulder external rotation 3+ 5 4 5    Middle trapezius      Lower trapezius      Elbow flexion      Elbow extension      Wrist flexion      Wrist extension      Wrist ulnar deviation  Wrist radial deviation      Wrist pronation      Wrist supination      Grip strength       (Blank rows = not tested)   TODAY'S TREATMENT:     [facility] Adult PT Treatment:                                                DATE: 08/18/23 Therapeutic Exercise: UBE L5 x 4 min alt fwd/bkwd Doorway stretch 3 positions 2 x 30 sec Standing with noodle: L arms, W arms, scap squeeze x 20 Cat/cow Manual Therapy: TPR & STM bilat thoracic paraspinals, scapular musculature Skilled assessment and palpation for TPDN Trigger Point Dry-Needling  Treatment instructions: Expect mild to moderate muscle soreness. S/S of pneumothorax if dry needled over a lung field, and to seek immediate medical attention should they occur. Patient verbalized understanding of these instructions and education. Patient Consent Given: Yes Education handout provided: Previously provided Muscles treated: bilat thoracic paraspinals, bilat periscapular musculature, UT Electrical stimulation performed: No Parameters: NA Treatment response/outcome: decreased palpable tightness; decreased pain     [facility] Adult PT Treatment:                                                DATE: 08/11/23 Therapeutic Exercise: UBE L5 x 4 min alt fwd/bkwd Cat/cow Doorway stretch 3 positions 2 x 30 sec Prone W x 10 Standing with noodle: L arms, W arms, scap squeeze Manual Therapy: TPR & STM Rt thoracic paraspinals, scapular musculature Skilled assessment and palpation for TPDN Trigger Point Dry-Needling  Treatment instructions: Expect mild to moderate muscle soreness. S/S of pneumothorax if dry needled over a lung field, and to seek immediate medical attention should they occur. Patient verbalized understanding of these instructions and education. Patient Consent Given: Yes Education handout provided:  Previously provided Muscles treated: Rt thoracic paraspinals, Rt periscapular musculature, UT Electrical stimulation performed: No Parameters: NA Treatment response/outcome: decreased palpable tightness; decreased pain   [facility] Adult PT Treatment:                                                DATE: 07/30/2023 Therapeutic Exercise: Cat/cow  Seated thoracic extension with 1/2 foam roller at mid back Seated UT variations --> LS stretch Arms crossed overhead + lateral trunk flexion stretch Lateral trunk flexion stretch with side support from orange PB Standing (noodle): Scap squeezes 10x5" L arms x15 W armsx15 Standing facing wall, bracing pelvic into wall: thoracic extension  Pilates [place] with head down --> ball b/w knees --> ankles Self-trigger point massage with theracane Manual Therapy: IASTM/TPR along medial border R scapula   [facility] Adult PT Treatment:                                                DATE: 07/16/23 Therapeutic Exercise: UBE L5; 4 min alt fwd/bwd Standing  Doorway stretch 3 positions x 30 sec x 2  Sit Lateral  flexion L/R 20 sec x2  Prone  Prone press up x 5 Cat cow x 5  Child's pose x 5 Axial extension with scap squeeze 5 sec x 10                                                                                                                            PATIENT EDUCATION:  Education details: TPDN, exam findings, POC, initial HEP Person educated: Patient Education method: Explanation, Demonstration, and Handouts Education comprehension: verbalized understanding, returned demonstration, and needs further education  HOME EXERCISE PROGRAM:  Access Code: [email or web address] URL: [email or web address] Date: 06/08/2023 Prepared by: Corlis Leak  Exercises - Seated Cervical Retraction  - 2 x daily - 7 x weekly - 1-2 sets - 5-10 reps - 10 sec  hold - Seated Scapular Retraction  - 2 x daily - 7 x weekly - 1-2 sets - 10 reps - 10 sec  hold - Standing Backward  Shoulder Rolls  - 2 x daily - 7 x weekly - 1 sets - 10 reps - 1-2 sec  hold - Standing Cervical Retraction with Sidebending  - 2 x daily - 7 x weekly - 1 sets - 5-6 reps - 10-15 sec  hold - Supine Diaphragmatic Breathing  - 2 x daily - 7 x weekly - 1 sets - 10 reps - 4-6 sec  hold - Supine Lower Trunk Rotation  - 2 x daily - 7 x weekly - 1 sets - 3-5 reps - 20-30 sec  hold - Shoulder External Rotation and Scapular Retraction with Resistance  - 1 x daily - 7 x weekly - 2 sets - 10 reps - Shoulder W - External Rotation with Resistance  - 1 x daily - 7 x weekly - 2 sets - 10 reps - Standing Shoulder Horizontal Abduction with Resistance  - 1 x daily - 7 x weekly - 2 sets - 10 reps - Standing Bilateral Low Shoulder Row with Anchored Resistance  - 2 x daily - 7 x weekly - 1-3 sets - 10 reps - 2-3 sec  hold - Shoulder extension with resistance - Neutral  - 1 x daily - 7 x weekly - 1-2 sets - 10 reps - 3-5 sec  hold  ASSESSMENT:  CLINICAL IMPRESSION: Pt has continued to progress cervical ROM and is pain free with all motions. She has decreased frequency of headaches. She continues with increased mm spasticity and will continue to benefit from skilled PT to work towards LTGs   RE-EVAL: Patient initially demonstrated good improvement in symptoms but has experienced a set back in the past 2 weeks with increased intensity of pain in the R side and shoulder blade area. Patient has a history of tummy tuck surgery ~ 2 years ago and has significant tightness to palpation through the abdomen into the R lateral trunk. She has tightness with lat stretch supine. Patient has limited trunk mobility related to abdominal restrictions. She will benefit from continued  treatment to address facial restrictions and improve trunk mobility/ROM. She has continued headaches and increased pain in the R periscapular area with active trigger points. She has continued stretching and postural strengthening as well as adding exercises at  home including burpees which may have contributed to the flare up of her symptoms. Patient will benefit from continued treatment to address problems identified.    OBJECTIVE IMPAIRMENTS:  multiple muscles in spasm including UTs, suboccipitals, and periscapular muscles likely related to her headaches and dizziness. Pt demos decreased cervical ROM with R>L rotator cuff weakness and decreased postural stability decreased activity tolerance, decreased endurance, decreased mobility, decreased ROM, decreased strength, increased fascial restrictions, increased muscle spasms, impaired UE functional use, postural dysfunction, and pain.    GOALS: Goals reviewed with patient? Yes  SHORT TERM GOALS: Target date: 06/26/2023   Pt will be ind with initial HEP Baseline:  Goal status: MET  2.  Pt will report >/=50% improvement in headaches Baseline:  Goal status: met  3.  Pt will be ind with maintaining postural stability for working at her desk Baseline:  Goal status: MET    LONG TERM GOALS: Target date: 09/24/2023  Pt will be ind with management and progression of HEP Baseline:  Goal status: on going   2.  Pt will be able to demo at least 5/5 periscapular muscles for improved postural stability with no pain  Baseline:  Goal status: on going - revise   3.  Pt will report >/=75% improvement in pain Baseline:  Goal status: on going   4.  Pt will have increased FOTO score to >/=69 Baseline: 52 07/16/23: 54 Goal status: on going     PLAN:  PT FREQUENCY: 2x/week  PT DURATION: 8 weeks  PLANNED INTERVENTIONS: Therapeutic exercises, Therapeutic activity, Neuromuscular re-education, Balance training, Gait training, Patient/Family education, Self Care, Joint mobilization, Vestibular training, Aquatic Therapy, Dry Needling, Electrical stimulation, Spinal mobilization, Cryotherapy, Moist heat, Taping, Traction, Ionotophoresis 4mg /ml Dexamethasone, Manual therapy, and Re-evaluation  PLAN FOR  NEXT SESSION: add prone shoulder strengthening, Thoracic mobility, Work on shoulder/rotator cuff strengthening as tolerated. Manual work/TPDN as indicated.    Saraiya Kozma, PT 08/18/2023, 4:51 PM

## 2023-08-20 ENCOUNTER — Other Ambulatory Visit: Payer: Self-pay

## 2023-08-20 MED ORDER — NARATRIPTAN HCL 2.5 MG PO TABS: 2.5000 mg | ORAL_TABLET | ORAL | 6 refills | Status: DC | PRN

## 2023-08-20 NOTE — Addendum Note (Signed)
Addended by: Ocie Doyne on: 08/20/2023 04:49 PM   Modules accepted: Orders

## 2023-08-20 NOTE — Telephone Encounter (Signed)
LVM for pt to call back.

## 2023-08-20 NOTE — Telephone Encounter (Signed)
Pt has called to report she is exhausted from her headaches, she is open to Botox, please call.

## 2023-08-20 NOTE — Telephone Encounter (Signed)
Pt is requesting a call back in response to vm left for her

## 2023-08-20 NOTE — Telephone Encounter (Signed)
Called pt and she stated that she has given it a couple of weeks and her headaches haven't gotten any better. Pt states that she is having headaches that will last a couple of days and nothing is helping to stop and control her headaches and she would like to go on botox.

## 2023-08-20 NOTE — Telephone Encounter (Signed)
Can we do a new botox start for her please? I will also send in an rx for naratriptan which she can take for rescue. She should not take the Ubrelvy or Maxalt with the naratriptan.

## 2023-08-23 ENCOUNTER — Other Ambulatory Visit: Payer: Self-pay | Admitting: Family Medicine

## 2023-08-23 DIAGNOSIS — R110 Nausea: Secondary | ICD-10-CM

## 2023-08-25 ENCOUNTER — Encounter: Payer: Self-pay | Admitting: Rehabilitative and Restorative Service Providers"

## 2023-08-25 ENCOUNTER — Ambulatory Visit: Payer: Managed Care, Other (non HMO) | Admitting: Rehabilitative and Restorative Service Providers"

## 2023-08-25 DIAGNOSIS — M546 Pain in thoracic spine: Secondary | ICD-10-CM | POA: Diagnosis present

## 2023-08-25 DIAGNOSIS — M6281 Muscle weakness (generalized): Secondary | ICD-10-CM

## 2023-08-25 DIAGNOSIS — M542 Cervicalgia: Secondary | ICD-10-CM | POA: Diagnosis present

## 2023-08-25 DIAGNOSIS — R293 Abnormal posture: Secondary | ICD-10-CM | POA: Diagnosis present

## 2023-08-25 NOTE — Therapy (Signed)
OUTPATIENT PHYSICAL THERAPY CERVICAL TREATMENT   Patient Name: Anna Robinson MRN: [id number] DOB:1971-04-22, 52 y.o., female Today's Date: 08/25/2023  END OF SESSION:  PT End of Session - 08/25/23 1618     Visit Number 15    Number of Visits 24    Date for PT Re-Evaluation 09/24/23    Authorization Type [employer]    PT Start Time 1617    PT Stop Time 1704    PT Time Calculation (min) 47 min    Activity Tolerance Patient tolerated treatment well                Past Medical History:  Diagnosis Date   Allergy    Anxiety    Constipation    GERD (gastroesophageal reflux disease)    IBS (irritable bowel syndrome)    PONV (postoperative nausea and vomiting)    Seasonal allergies    Past Surgical History:  Procedure Laterality Date   COLONOSCOPY     ~10 yrs- normal per pt    TOTAL ABDOMINAL HYSTERECTOMY     WISDOM TOOTH EXTRACTION     age 33-17   Patient Active Problem List   Diagnosis Date Noted   Cervical neck pain with evidence of disc disease 05/06/2023   Migraine without status migrainosus, not intractable 04/13/2023   Nausea 04/13/2023   Muscle spasms of neck 04/13/2023   Post-menopausal 04/10/2023   DDD (degenerative disc disease), lumbar 12/01/2022   DDD (degenerative disc disease), 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   Mouth sores 12/01/2022   Lower abdominal pain 12/01/2022   Acute bilateral low back pain without sciatica 12/01/2022   Slow transit constipation 12/01/2022   Anxiety 05/30/2022   Chest pain 02/24/2022   Overweight (BMI 25.0-29.9) 02/24/2022   Gastroesophageal reflux disease without esophagitis 02/24/2022   Sinus arrhythmia seen on electrocardiogram 02/24/2022   Sinus bradycardia 02/24/2022   Class 1 obesity due to excess calories without serious comorbidity with body mass index (BMI) of 31.0 to 31.9 in adult 02/19/2021   Fibrocystic changes of left breast 05/15/2020   Class 1 obesity due to excess calories without serious comorbidity with body mass  index (BMI) of 30.0 to 30.9 in adult 02/01/2020   Right ovarian cyst 06/02/2019   Elevated liver enzymes 02/14/2019   Vitamin D insufficiency 01/26/2019   Elevated fasting glucose 01/26/2019   Dyslipidemia 01/26/2019   Epigastric pain 02/15/2018   Fatigue 01/15/2018   Muscle cramps 03/02/2015    PCP: Jomarie Longs, [name]  REFERRING PROVIDER: Jomarie Longs, [name]  REFERRING DIAG: [phone] (ICD-10-CM) - Muscle spasms of neck M50.90 (ICD-10-CM) - Cervical neck pain with evidence of disc disease R51.9 (ICD-10-CM) - Frequent headaches  THERAPY DIAG:  Cervicalgia  Pain in thoracic spine  Muscle weakness (generalized)  Abnormal posture  Rationale for Evaluation and Treatment: Rehabilitation  ONSET DATE: January 2024  SUBJECTIVE:  SUBJECTIVE STATEMENT: Patient reports that she felt OK after last treatment but then she had a headache for the next 3-4 days. She has been doing her exercises - 3 days of strengthening with weights and 2 days of pilates and riding her bike at home. Good release of soft tissue with treatment. Can move easier.    Pt states she has some soreness and pain in the R midback and in her neck. She did not sleep well last night. Her mother in law passed last week and she has been stressed with that and with return to work. She has a migraine today. Her work has been busy and she has been trying to catch up with work from being off last week. She has been doing her exercises this week. Significant improvement in headache and neck pain with treatment today.  Hand dominance: Right  PERTINENT HISTORY:  N/a  PAIN:  Are you having pain? Yes: NPRS scale: currently 3/10 in the R shoulder blade area; HA Pain location: now feeling pain and tightness in the shoulder blade  area  Pain description: tightness; throbbing; aching R side constant  Aggravating factors: not sure  Relieving factors: medication, chiropractor, massage, dry needling   PRECAUTIONS: None  WEIGHT BEARING RESTRICTIONS: No  FALLS:  Has patient fallen in last 6 months? 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 No large vessel occlusion or proximal hemodynamically significant stenosis. 2. No aneurysm identified.  IMPRESSION: Normal MRI of the brain.   PATIENT SURVEYS:  FOTO 58; predicted 69 07/16/23: 54   POSTURE: rounded shoulders and forward head  PALPATION: TTP R periscapular muscles, thoracic paraspinals, bilat UTs, bilat cervical paraspinals and suboccipitals   CERVICAL ROM:   Active ROM A/PROM (deg) eval AROM  07/16/23 AROM 08/18/23  Flexion 30 57 57  Extension 45 45 50  Right lateral flexion 39 30 41  Left lateral flexion 30* 27 pain  41  Right rotation 44 65  60  Left rotation 52 63 tight  70   (Blank rows = not tested)  UPPER EXTREMITY ROM:  Active ROM Right eval Left eval Right  07/16/23  Shoulder flexion WFL pulls into mid back [facility] WNL's   Shoulder extension     Shoulder abduction WFL pulls into mid back [facility] WNL's  Tight R side   Shoulder adduction     Shoulder extension     Shoulder internal rotation T10* T4 T8 pain   Shoulder external rotation T2 T2 WNL's   Elbow flexion     Elbow extension     Wrist flexion     Wrist extension     Wrist ulnar deviation     Wrist radial deviation     Wrist pronation     Wrist supination      (Blank rows = not tested) * pain  UPPER EXTREMITY MMT:  MMT Right eval Left eval Right  07/16/23 Left  07/16/23  Shoulder flexion 3+ 3+ 4 5  Shoulder extension 4 5 5 5   Shoulder abduction 3+ 3+ 4 5  Shoulder adduction      Shoulder internal rotation 3 5 4+ 5   Shoulder external rotation 3+ 5 4 5   Middle trapezius      Lower trapezius      Elbow flexion      Elbow extension  Wrist flexion      Wrist extension      Wrist ulnar deviation      Wrist radial deviation      Wrist pronation      Wrist supination      Grip strength       (Blank rows = not tested)   TODAY'S TREATMENT:     [facility] Adult PT Treatment:                                                DATE: 08/25/23 Therapeutic Exercise: UBE L5 x 4 min alt fwd/bkwd Doorway stretch 3 positions 2 x 30 sec Standing with noodle: L arms, W arms, scap squeeze x 20 Cat/cow Manual Therapy: TPR & STM though the abdomen and fascia btn transverse abdominals and lats  Skilled palpation to assess response to manual work and dry needling  Trigger Point Dry-Needling  Treatment instructions: Expect mild to moderate muscle soreness. S/S of pneumothorax if dry needled over a lung field, and to seek immediate medical attention should they occur. Patient verbalized understanding of these instructions and education. Patient Consent Given: Yes Education handout provided: Previously provided Muscles treated: bilat lower abdominals to rectus  Electrical stimulation performed: yes Parameters: mAmp current Treatment response/outcome: decreased palpable tightness; decreased pain   TODAY'S TREATMENT:     [facility] Adult PT Treatment:                                                DATE: 08/18/23 Therapeutic Exercise: UBE L5 x 4 min alt fwd/bkwd Doorway stretch 3 positions 2 x 30 sec Standing with noodle: L arms, W arms, scap squeeze x 20 Cat/cow Manual Therapy: TPR & STM bilat thoracic paraspinals, scapular musculature Skilled assessment and palpation for TPDN Trigger Point Dry-Needling  Treatment instructions: Expect mild to moderate muscle soreness. S/S of pneumothorax if dry needled over a lung field, and to seek immediate medical attention should they occur. Patient verbalized understanding of these  instructions and education. Patient Consent Given: Yes Education handout provided: Previously provided Muscles treated: bilat thoracic paraspinals, bilat periscapular musculature, UT Electrical stimulation performed: No Parameters: NA Treatment response/outcome: decreased palpable tightness; decreased pain     [facility] Adult PT Treatment:                                                DATE: 08/11/23 Therapeutic Exercise: UBE L5 x 4 min alt fwd/bkwd Cat/cow Doorway stretch 3 positions 2 x 30 sec Prone W x 10 Standing with noodle: L arms, W arms, scap squeeze Manual Therapy: TPR & STM Rt thoracic paraspinals, scapular musculature Skilled assessment and palpation for TPDN Trigger Point Dry-Needling  Treatment instructions: Expect mild to moderate muscle soreness. S/S of pneumothorax if dry needled over a lung field, and to seek immediate medical attention should they occur. Patient verbalized understanding of these instructions and education. Patient Consent Given: Yes Education handout provided: Previously provided Muscles treated: Rt thoracic paraspinals, Rt periscapular musculature, UT Electrical stimulation performed: No Parameters: NA Treatment response/outcome: decreased palpable tightness; decreased pain  PATIENT EDUCATION:  Education details: TPDN, exam findings, POC, initial HEP Person educated: Patient Education method: Explanation, Demonstration, and Handouts Education comprehension: verbalized understanding, returned demonstration, and needs further education  HOME EXERCISE PROGRAM:  Access Code: [email or web address] URL: [email or web address] Date: 06/08/2023 Prepared by: Corlis Leak  Exercises - Seated Cervical Retraction  - 2 x daily - 7 x weekly - 1-2 sets - 5-10 reps - 10 sec  hold - Seated Scapular Retraction  - 2 x daily - 7 x weekly - 1-2  sets - 10 reps - 10 sec  hold - Standing Backward Shoulder Rolls  - 2 x daily - 7 x weekly - 1 sets - 10 reps - 1-2 sec  hold - Standing Cervical Retraction with Sidebending  - 2 x daily - 7 x weekly - 1 sets - 5-6 reps - 10-15 sec  hold - Supine Diaphragmatic Breathing  - 2 x daily - 7 x weekly - 1 sets - 10 reps - 4-6 sec  hold - Supine Lower Trunk Rotation  - 2 x daily - 7 x weekly - 1 sets - 3-5 reps - 20-30 sec  hold - Shoulder External Rotation and Scapular Retraction with Resistance  - 1 x daily - 7 x weekly - 2 sets - 10 reps - Shoulder W - External Rotation with Resistance  - 1 x daily - 7 x weekly - 2 sets - 10 reps - Standing Shoulder Horizontal Abduction with Resistance  - 1 x daily - 7 x weekly - 2 sets - 10 reps - Standing Bilateral Low Shoulder Row with Anchored Resistance  - 2 x daily - 7 x weekly - 1-3 sets - 10 reps - 2-3 sec  hold - Shoulder extension with resistance - Neutral  - 1 x daily - 7 x weekly - 1-2 sets - 10 reps - 3-5 sec  hold  ASSESSMENT:  CLINICAL IMPRESSION: Patient has significant muscular tightness and fascial restrictions through the abdomen with pulling into the low back and shoulder area. Good release of fascial restrictions bth the transverse abdominals and lats. She had good improvement in thoracic rotation following treatment. Patient has continued to progress cervical ROM and is pain free with all motions. She continues with increased mm spasticity and will continue to benefit from skilled PT to work towards LTGs   RE-EVAL: Patient initially demonstrated good improvement in symptoms but has experienced a set back in the past 2 weeks with increased intensity of pain in the R side and shoulder blade area. Patient has a history of tummy tuck surgery ~ 2 years ago and has significant tightness to palpation through the abdomen into the R lateral trunk. She has tightness with lat stretch supine. Patient has limited trunk mobility related to abdominal  restrictions. She will benefit from continued treatment to address facial restrictions and improve trunk mobility/ROM. She has continued headaches and increased pain in the R periscapular area with active trigger points. She has continued stretching and postural strengthening as well as adding exercises at home including burpees which may have contributed to the flare up of her symptoms. Patient will benefit from continued treatment to address problems identified.    OBJECTIVE IMPAIRMENTS:  multiple muscles in spasm including UTs, suboccipitals, and periscapular muscles likely related to her headaches and dizziness. Pt demos decreased cervical ROM with R>L rotator cuff weakness and decreased postural stability decreased activity tolerance, decreased endurance, decreased mobility, decreased ROM, decreased strength, increased fascial restrictions, increased muscle spasms, impaired UE functional  use, postural dysfunction, and pain.    GOALS: Goals reviewed with patient? Yes  SHORT TERM GOALS: Target date: 06/26/2023   Pt will be ind with initial HEP Baseline:  Goal status: MET  2.  Pt will report >/=50% improvement in headaches Baseline:  Goal status: met  3.  Pt will be ind with maintaining postural stability for working at her desk Baseline:  Goal status: MET    LONG TERM GOALS: Target date: 09/24/2023  Pt will be ind with management and progression of HEP Baseline:  Goal status: on going   2.  Pt will be able to demo at least 5/5 periscapular muscles for improved postural stability with no pain  Baseline:  Goal status: on going - revise   3.  Pt will report >/=75% improvement in pain Baseline:  Goal status: on going   4.  Pt will have increased FOTO score to >/=69 Baseline: 52 07/16/23: 54 Goal status: on going     PLAN:  PT FREQUENCY: 2x/week  PT DURATION: 8 weeks  PLANNED INTERVENTIONS: Therapeutic exercises, Therapeutic activity, Neuromuscular re-education,  Balance training, Gait training, Patient/Family education, Self Care, Joint mobilization, Vestibular training, Aquatic Therapy, Dry Needling, Electrical stimulation, Spinal mobilization, Cryotherapy, Moist heat, Taping, Traction, Ionotophoresis 4mg /ml Dexamethasone, Manual therapy, and Re-evaluation  PLAN FOR NEXT SESSION: add prone shoulder strengthening, Thoracic mobility, Work on shoulder/rotator cuff strengthening as tolerated. Manual work/TPDN as indicated.    Val Riles, PT 08/25/2023, 5:11 PM

## 2023-08-26 NOTE — Telephone Encounter (Signed)
Submitted benefit verification, will update with results. [id number]

## 2023-08-27 ENCOUNTER — Ambulatory Visit: Payer: Managed Care, Other (non HMO) | Admitting: Rehabilitative and Restorative Service Providers"

## 2023-08-27 NOTE — Telephone Encounter (Signed)
Completed [employer] PA form and placed in nurse pod for MD signature.

## 2023-09-01 NOTE — Telephone Encounter (Signed)
Faxed [employer] PA form along with OV notes to [phone].

## 2023-09-02 ENCOUNTER — Ambulatory Visit: Payer: Managed Care, Other (non HMO) | Admitting: Rehabilitative and Restorative Service Providers"

## 2023-09-02 ENCOUNTER — Encounter: Payer: Self-pay | Admitting: Rehabilitative and Restorative Service Providers"

## 2023-09-02 DIAGNOSIS — M546 Pain in thoracic spine: Secondary | ICD-10-CM

## 2023-09-02 DIAGNOSIS — R293 Abnormal posture: Secondary | ICD-10-CM

## 2023-09-02 DIAGNOSIS — M6281 Muscle weakness (generalized): Secondary | ICD-10-CM

## 2023-09-02 DIAGNOSIS — M542 Cervicalgia: Secondary | ICD-10-CM | POA: Diagnosis not present

## 2023-09-02 NOTE — Therapy (Signed)
OUTPATIENT PHYSICAL THERAPY CERVICAL TREATMENT   Patient Name: Anna Robinson MRN: [id number] DOB:1971/02/27, 52 y.o., female Today's Date: 09/02/2023  END OF SESSION:  PT End of Session - 09/02/23 1538     Visit Number 16    Number of Visits 24    Date for PT Re-Evaluation 09/24/23    Authorization Type [employer]    PT Start Time 1535    PT Stop Time 1623    PT Time Calculation (min) 48 min    Activity Tolerance Patient tolerated treatment well                Past Medical History:  Diagnosis Date   Allergy    Anxiety    Constipation    GERD (gastroesophageal reflux disease)    IBS (irritable bowel syndrome)    PONV (postoperative nausea and vomiting)    Seasonal allergies    Past Surgical History:  Procedure Laterality Date   COLONOSCOPY     ~10 yrs- normal per pt    TOTAL ABDOMINAL HYSTERECTOMY     WISDOM TOOTH EXTRACTION     age 45-17   Patient Active Problem List   Diagnosis Date Noted   Cervical neck pain with evidence of disc disease 05/06/2023   Migraine without status migrainosus, not intractable 04/13/2023   Nausea 04/13/2023   Muscle spasms of neck 04/13/2023   Post-menopausal 04/10/2023   DDD (degenerative disc disease), lumbar 12/01/2022   DDD (degenerative disc disease), 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   Mouth sores 12/01/2022   Lower abdominal pain 12/01/2022   Acute bilateral low back pain without sciatica 12/01/2022   Slow transit constipation 12/01/2022   Anxiety 05/30/2022   Chest pain 02/24/2022   Overweight (BMI 25.0-29.9) 02/24/2022   Gastroesophageal reflux disease without esophagitis 02/24/2022   Sinus arrhythmia seen on electrocardiogram 02/24/2022   Sinus bradycardia 02/24/2022   Class 1 obesity due to excess calories without serious comorbidity with body mass index (BMI) of 31.0 to 31.9 in adult 02/19/2021   Fibrocystic changes of left breast 05/15/2020   Class 1 obesity due to excess calories without serious comorbidity with body mass  index (BMI) of 30.0 to 30.9 in adult 02/01/2020   Right ovarian cyst 06/02/2019   Elevated liver enzymes 02/14/2019   Vitamin D insufficiency 01/26/2019   Elevated fasting glucose 01/26/2019   Dyslipidemia 01/26/2019   Epigastric pain 02/15/2018   Fatigue 01/15/2018   Muscle cramps 03/02/2015    PCP: Jomarie Longs, [name]  REFERRING PROVIDER: Jomarie Longs, [name]  REFERRING DIAG: [id number] (ICD-10-CM) - Muscle spasms of neck M50.90 (ICD-10-CM) - Cervical neck pain with evidence of disc disease R51.9 (ICD-10-CM) - Frequent headaches  THERAPY DIAG:  Cervicalgia  Pain in thoracic spine  Muscle weakness (generalized)  Abnormal posture  Rationale for Evaluation and Treatment: Rehabilitation  ONSET DATE: January 2024  SUBJECTIVE:  SUBJECTIVE STATEMENT: Patient reports that she felt good following last treatment for 3-4 days after work in the lower abdominals and flank last week. She has a headache and that has been recurring 3-4 times a week. She is awaiting approval for botox injections from the neurologist.  She has been doing her exercises - 3 days of strengthening with weights and 2 days of pilates and riding her bike at home. Good release of soft tissue with treatment. Can move easier.    Pt states she has some soreness and pain in the R midback and in her neck. She did not sleep well last night. Her mother in law passed last week and she has been stressed with that and with return to work. She has a migraine today. Her work has been busy and she has been trying to catch up with work from being off last week. She has been doing her exercises this week. Significant improvement in headache and neck pain with treatment today.  Hand dominance: Right  PERTINENT HISTORY:  N/a  PAIN:   Are you having pain? Yes: NPRS scale: currently 5-6/10 in the R shoulder blade area; 7-8/10 HA Pain location: now feeling pain and tightness in the shoulder blade area  Pain description: tightness; throbbing; aching R side constant  Aggravating factors: not sure  Relieving factors: medication, chiropractor, massage, dry needling   PRECAUTIONS: None  WEIGHT BEARING RESTRICTIONS: No  FALLS:  Has patient fallen in last 6 months? 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 No large vessel occlusion or proximal hemodynamically significant stenosis. 2. No aneurysm identified.  IMPRESSION: Normal MRI of the brain.   PATIENT SURVEYS:  FOTO 58; predicted 69 07/16/23: 54   POSTURE: rounded shoulders and forward head  PALPATION: TTP R periscapular muscles, thoracic paraspinals, bilat UTs, bilat cervical paraspinals and suboccipitals   CERVICAL ROM:   Active ROM A/PROM (deg) eval AROM  07/16/23 AROM 08/18/23  Flexion 30 57 57  Extension 45 45 50  Right lateral flexion 39 30 41  Left lateral flexion 30* 27 pain  41  Right rotation 44 65  60  Left rotation 52 63 tight  70   (Blank rows = not tested)  UPPER EXTREMITY ROM:  Active ROM Right eval Left eval Right  07/16/23  Shoulder flexion WFL pulls into mid back [facility] WNL's   Shoulder extension     Shoulder abduction WFL pulls into mid back [facility] WNL's  Tight R side   Shoulder adduction     Shoulder extension     Shoulder internal rotation T10* T4 T8 pain   Shoulder external rotation T2 T2 WNL's   Elbow flexion     Elbow extension     Wrist flexion     Wrist extension     Wrist ulnar deviation     Wrist radial deviation     Wrist pronation     Wrist supination      (Blank rows = not tested) * pain  UPPER EXTREMITY MMT:  MMT Right eval Left eval Right  07/16/23 Left   07/16/23  Shoulder flexion 3+ 3+ 4 5  Shoulder extension 4 5 5 5   Shoulder abduction 3+ 3+ 4 5  Shoulder adduction      Shoulder internal rotation 3 5 4+ 5  Shoulder external rotation 3+ 5 4  5  Middle trapezius      Lower trapezius      Elbow flexion      Elbow extension      Wrist flexion      Wrist extension      Wrist ulnar deviation      Wrist radial deviation      Wrist pronation      Wrist supination      Grip strength       (Blank rows = not tested)   TODAY'S TREATMENT:     [facility] Adult PT Treatment:                                                DATE: 09/02/23 Therapeutic Exercise: UBE L5 x 4 min alt fwd/bkwd Doorway stretch 3 positions 2 x 30 sec Manual Therapy: TPR & STM though the abdomen and fascia btn transverse abdominals and lats  Skilled palpation to assess response to manual work and dry needling  Myofacial release and scar massage abdominals  Trigger Point Dry-Needling  Treatment instructions: Expect mild to moderate muscle soreness. S/S of pneumothorax if dry needled over a lung field, and to seek immediate medical attention should they occur. Patient verbalized understanding of these instructions and education. Patient Consent Given: Yes Education handout provided: Previously provided Muscles treated: bilat suboccipitals; cervical paraspinals; L lower abdominals; bilat transverse abdominals/lat junction  Electrical stimulation performed: yes to  L lower abdominals; bilat transverse abdominals/lat junction Parameters: mAmp current Treatment response/outcome: decreased palpable tightness; decreased pain     [facility] Adult PT Treatment:                                                DATE: 08/25/23 Therapeutic Exercise: UBE L5 x 4 min alt fwd/bkwd Doorway stretch 3 positions 2 x 30 sec Standing with noodle: L arms, W arms, scap squeeze x 20 Cat/cow Manual Therapy: TPR & STM though the abdomen and fascia btn transverse abdominals and lats  Skilled palpation to  assess response to manual work and dry needling  Trigger Point Dry-Needling  Treatment instructions: Expect mild to moderate muscle soreness. S/S of pneumothorax if dry needled over a lung field, and to seek immediate medical attention should they occur. Patient verbalized understanding of these instructions and education. Patient Consent Given: Yes Education handout provided: Previously provided Muscles treated: bilat lower abdominals to rectus  Electrical stimulation performed: yes Parameters: mAmp current Treatment response/outcome: decreased palpable tightness; decreased pain   TODAY'S TREATMENT:     [facility] Adult PT Treatment:                                                DATE: 08/18/23 Therapeutic Exercise: UBE L5 x 4 min alt fwd/bkwd Doorway stretch 3 positions 2 x 30 sec Standing with noodle: L arms, W arms, scap squeeze x 20 Cat/cow Manual Therapy: TPR & STM bilat thoracic paraspinals, scapular musculature Skilled assessment and palpation for TPDN Trigger Point Dry-Needling  Treatment instructions: Expect mild to moderate muscle soreness. S/S of pneumothorax if dry needled over a lung field, and to  seek immediate medical attention should they occur. Patient verbalized understanding of these instructions and education. Patient Consent Given: Yes Education handout provided: Previously provided Muscles treated: bilat thoracic paraspinals, bilat periscapular musculature, UT Electrical stimulation performed: No Parameters: NA Treatment response/outcome: decreased palpable tightness; decreased pain                                                                                                                         PATIENT EDUCATION:  Education details: TPDN, exam findings, POC, initial HEP Person educated: Patient Education method: Explanation, Demonstration, and Handouts Education comprehension: verbalized understanding, returned demonstration, and needs further education  HOME  EXERCISE PROGRAM:  Access Code: [email or web address] URL: [email or web address] Date: 06/08/2023 Prepared by: Corlis Leak  Exercises - Seated Cervical Retraction  - 2 x daily - 7 x weekly - 1-2 sets - 5-10 reps - 10 sec  hold - Seated Scapular Retraction  - 2 x daily - 7 x weekly - 1-2 sets - 10 reps - 10 sec  hold - Standing Backward Shoulder Rolls  - 2 x daily - 7 x weekly - 1 sets - 10 reps - 1-2 sec  hold - Standing Cervical Retraction with Sidebending  - 2 x daily - 7 x weekly - 1 sets - 5-6 reps - 10-15 sec  hold - Supine Diaphragmatic Breathing  - 2 x daily - 7 x weekly - 1 sets - 10 reps - 4-6 sec  hold - Supine Lower Trunk Rotation  - 2 x daily - 7 x weekly - 1 sets - 3-5 reps - 20-30 sec  hold - Shoulder External Rotation and Scapular Retraction with Resistance  - 1 x daily - 7 x weekly - 2 sets - 10 reps - Shoulder W - External Rotation with Resistance  - 1 x daily - 7 x weekly - 2 sets - 10 reps - Standing Shoulder Horizontal Abduction with Resistance  - 1 x daily - 7 x weekly - 2 sets - 10 reps - Standing Bilateral Low Shoulder Row with Anchored Resistance  - 2 x daily - 7 x weekly - 1-3 sets - 10 reps - 2-3 sec  hold - Shoulder extension with resistance - Neutral  - 1 x daily - 7 x weekly - 1-2 sets - 10 reps - 3-5 sec  hold  ASSESSMENT:  CLINICAL IMPRESSION: Patient has decreased muscular tightness and fascial restrictions through the abdomen with less pulling into the low back and shoulder area. Good release of fascial restrictions bth the transverse abdominals and lats. She had good improvement in thoracic rotation following treatment. Patient has continued to progress cervical ROM and is pain free with all motions. Headaches continue to occur 3-4 days a week. She continues with increased mm tightness and facial restrictions and will continue to benefit from skilled PT to work towards LTGs   RE-EVAL: Patient initially demonstrated good improvement in symptoms but has  experienced a set back in the past 2 weeks with  increased intensity of pain in the R side and shoulder blade area. Patient has a history of tummy tuck surgery ~ 2 years ago and has significant tightness to palpation through the abdomen into the R lateral trunk. She has tightness with lat stretch supine. Patient has limited trunk mobility related to abdominal restrictions. She will benefit from continued treatment to address facial restrictions and improve trunk mobility/ROM. She has continued headaches and increased pain in the R periscapular area with active trigger points. She has continued stretching and postural strengthening as well as adding exercises at home including burpees which may have contributed to the flare up of her symptoms. Patient will benefit from continued treatment to address problems identified.    OBJECTIVE IMPAIRMENTS:  multiple muscles in spasm including UTs, suboccipitals, and periscapular muscles likely related to her headaches and dizziness. Pt demos decreased cervical ROM with R>L rotator cuff weakness and decreased postural stability decreased activity tolerance, decreased endurance, decreased mobility, decreased ROM, decreased strength, increased fascial restrictions, increased muscle spasms, impaired UE functional use, postural dysfunction, and pain.    GOALS: Goals reviewed with patient? Yes  SHORT TERM GOALS: Target date: 06/26/2023   Pt will be ind with initial HEP Baseline:  Goal status: MET  2.  Pt will report >/=50% improvement in headaches Baseline:  Goal status: met  3.  Pt will be ind with maintaining postural stability for working at her desk Baseline:  Goal status: MET    LONG TERM GOALS: Target date: 09/24/2023  Pt will be ind with management and progression of HEP Baseline:  Goal status: on going   2.  Pt will be able to demo at least 5/5 periscapular muscles for improved postural stability with no pain  Baseline:  Goal status: on going -  revise   3.  Pt will report >/=75% improvement in pain Baseline:  Goal status: on going   4.  Pt will have increased FOTO score to >/=69 Baseline: 52 07/16/23: 54 Goal status: on going     PLAN:  PT FREQUENCY: 2x/week  PT DURATION: 8 weeks  PLANNED INTERVENTIONS: Therapeutic exercises, Therapeutic activity, Neuromuscular re-education, Balance training, Gait training, Patient/Family education, Self Care, Joint mobilization, Vestibular training, Aquatic Therapy, Dry Needling, Electrical stimulation, Spinal mobilization, Cryotherapy, Moist heat, Taping, Traction, Ionotophoresis 4mg /ml Dexamethasone, Manual therapy, and Re-evaluation  PLAN FOR NEXT SESSION: add prone shoulder strengthening, Thoracic mobility, Work on shoulder/rotator cuff strengthening as tolerated. Manual work/TPDN as indicated.    Val Riles, PT 09/02/2023, 3:39 PM

## 2023-09-03 MED ORDER — ONABOTULINUMTOXINA 200 UNITS IJ SOLR: INTRAMUSCULAR | 2 refills | Status: DC

## 2023-09-03 NOTE — Telephone Encounter (Signed)
Patient informed with all the below. Rx sent to [facility].

## 2023-09-03 NOTE — Addendum Note (Signed)
Addended by: Aura Camps on: 09/03/2023 01:48 PM   Modules accepted: Orders

## 2023-09-03 NOTE — Telephone Encounter (Signed)
Left message for patient to call to relay Botox has been sent in and to relay Dr.Chima original response.

## 2023-09-03 NOTE — Telephone Encounter (Signed)
Received fax of approval from [place]. I called to be sure [phone] would be included on this auth, I was told the admin code is included and claim will be paid. Ref# [id number]. 09/03/23 @ 1:08 pm.  Please send rx to [facility], thank you!

## 2023-09-10 ENCOUNTER — Encounter: Payer: Managed Care, Other (non HMO) | Admitting: Rehabilitative and Restorative Service Providers"

## 2023-09-16 ENCOUNTER — Encounter: Payer: Self-pay | Admitting: Rehabilitative and Restorative Service Providers"

## 2023-09-16 ENCOUNTER — Ambulatory Visit
Payer: Managed Care, Other (non HMO) | Attending: Physician Assistant | Admitting: Rehabilitative and Restorative Service Providers"

## 2023-09-16 DIAGNOSIS — M6281 Muscle weakness (generalized): Secondary | ICD-10-CM | POA: Diagnosis present

## 2023-09-16 DIAGNOSIS — M546 Pain in thoracic spine: Secondary | ICD-10-CM | POA: Insufficient documentation

## 2023-09-16 DIAGNOSIS — R293 Abnormal posture: Secondary | ICD-10-CM | POA: Insufficient documentation

## 2023-09-16 DIAGNOSIS — M542 Cervicalgia: Secondary | ICD-10-CM | POA: Insufficient documentation

## 2023-09-16 NOTE — Therapy (Signed)
OUTPATIENT PHYSICAL THERAPY CERVICAL TREATMENT   Patient Name: Anna Robinson MRN: [id number] DOB:1971/06/26, 51 y.o., female Today's Date: 09/16/2023  END OF SESSION:  PT End of Session - 09/16/23 1409     Visit Number 17    Number of Visits 24    Date for PT Re-Evaluation 09/24/23    Authorization Type [employer]    PT Start Time 1406    PT Stop Time 1450    PT Time Calculation (min) 44 min                Past Medical History:  Diagnosis Date   Allergy    Anxiety    Constipation    GERD (gastroesophageal reflux disease)    IBS (irritable bowel syndrome)    PONV (postoperative nausea and vomiting)    Seasonal allergies    Past Surgical History:  Procedure Laterality Date   COLONOSCOPY     ~10 yrs- normal per pt    TOTAL ABDOMINAL HYSTERECTOMY     WISDOM TOOTH EXTRACTION     age [age]   Patient Active Problem List   Diagnosis Date Noted   Cervical neck pain with evidence of disc disease 05/06/2023   Migraine without status migrainosus, not intractable 04/13/2023   Nausea 04/13/2023   Muscle spasms of neck 04/13/2023   Post-menopausal 04/10/2023   DDD (degenerative disc disease), lumbar 12/01/2022   DDD (degenerative disc disease), 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   Mouth sores 12/01/2022   Lower abdominal pain 12/01/2022   Acute bilateral low back pain without sciatica 12/01/2022   Slow transit constipation 12/01/2022   Anxiety 05/30/2022   Chest pain 02/24/2022   Overweight (BMI 25.0-29.9) 02/24/2022   Gastroesophageal reflux disease without esophagitis 02/24/2022   Sinus arrhythmia seen on electrocardiogram 02/24/2022   Sinus bradycardia 02/24/2022   Class 1 obesity due to excess calories without serious comorbidity with body mass index (BMI) of 31.0 to 31.9 in adult 02/19/2021   Fibrocystic changes of left breast 05/15/2020   Class 1 obesity due to excess calories without serious comorbidity with body mass index (BMI) of 30.0 to 30.9 in adult 02/01/2020   Right  ovarian cyst 06/02/2019   Elevated liver enzymes 02/14/2019   Vitamin D insufficiency 01/26/2019   Elevated fasting glucose 01/26/2019   Dyslipidemia 01/26/2019   Epigastric pain 02/15/2018   Fatigue 01/15/2018   Muscle cramps 03/02/2015    PCP: Jomarie Longs, [name]  REFERRING PROVIDER: Jomarie Longs, [name]  REFERRING DIAG: [phone] (ICD-10-CM) - Muscle spasms of neck M50.90 (ICD-10-CM) - Cervical neck pain with evidence of disc disease R51.9 (ICD-10-CM) - Frequent headaches  THERAPY DIAG:  Cervicalgia  Pain in thoracic spine  Muscle weakness (generalized)  Abnormal posture  Rationale for Evaluation and Treatment: Rehabilitation  ONSET DATE: January 2024  SUBJECTIVE:  SUBJECTIVE STATEMENT: Patient reports that she felt good following last treatment for almost a week. She is feeling better through the lower abdominals and flank. She had no pain with working out. She still has a headache and that has been recurring 3-4 times a week. She is awaiting approval for botox injections from the neurologist.  She has been doing her exercises - 3 days of strengthening with weights and 2 days of pilates and riding her bike at home. Good release of soft tissue with treatment. Can move easier.    Pt states she has some soreness and pain in the R midback and in her neck. She did not sleep well last night. Her mother in law passed last week and she has been stressed with that and with return to work. She has a migraine today. Her work has been busy and she has been trying to catch up with work from being off last week. She has been doing her exercises this week. Significant improvement in headache and neck pain with treatment today.  Hand dominance: Right  PERTINENT HISTORY:  N/a  PAIN:  Are  you having pain? Yes: NPRS scale: currently 5/10 in the R shoulder blade area; 6-7/10 HA Pain location: now feeling pain and tightness in the shoulder blade area  Pain description: tightness; throbbing; aching R side constant  Aggravating factors: not sure  Relieving factors: medication, chiropractor, massage, dry needling   PRECAUTIONS: None  WEIGHT BEARING RESTRICTIONS: No  FALLS:  Has patient fallen in last 6 months? 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 No large vessel occlusion or proximal hemodynamically significant stenosis. 2. No aneurysm identified.  IMPRESSION: Normal MRI of the brain.   PATIENT SURVEYS:  FOTO 58; predicted 69 07/16/23: 54   POSTURE: rounded shoulders and forward head  PALPATION: TTP R periscapular muscles, thoracic paraspinals, bilat UTs, bilat cervical paraspinals and suboccipitals   CERVICAL ROM:   Active ROM A/PROM (deg) eval AROM  07/16/23 AROM 08/18/23  Flexion 30 57 57  Extension 45 45 50  Right lateral flexion 39 30 41  Left lateral flexion 30* 27 pain  41  Right rotation 44 65  60  Left rotation 52 63 tight  70   (Blank rows = not tested)  UPPER EXTREMITY ROM:  Active ROM Right eval Left eval Right  07/16/23  Shoulder flexion WFL pulls into mid back [facility] WNL's   Shoulder extension     Shoulder abduction WFL pulls into mid back [facility] WNL's  Tight R side   Shoulder adduction     Shoulder extension     Shoulder internal rotation T10* T4 T8 pain   Shoulder external rotation T2 T2 WNL's   Elbow flexion     Elbow extension     Wrist flexion     Wrist extension     Wrist ulnar deviation     Wrist radial deviation     Wrist pronation     Wrist supination      (Blank rows = not tested) * pain  UPPER EXTREMITY MMT:  MMT Right eval Left eval Right  07/16/23 Left  07/16/23   Shoulder flexion 3+ 3+ 4 5  Shoulder extension 4 5 5 5   Shoulder abduction 3+ 3+ 4 5  Shoulder adduction      Shoulder internal rotation 3 5  4+ 5  Shoulder external rotation 3+ 5 4 5   Middle trapezius      Lower trapezius      Elbow flexion      Elbow extension      Wrist flexion      Wrist extension      Wrist ulnar deviation      Wrist radial deviation      Wrist pronation      Wrist supination      Grip strength       (Blank rows = not tested)   TODAY'S TREATMENT:     [facility] Adult PT Treatment:                                                DATE: 09/16/23 Therapeutic Exercise: Reviewed HEP Manual Therapy: TPR & STM though the abdomen and posterior cervical musculature   Skilled palpation to assess response to manual work and dry needling  Myofacial release and scar massage abdominals  Trigger Point Dry-Needling  Treatment instructions: Expect mild to moderate muscle soreness. Patient verbalized understanding of these instructions and education. Patient Consent Given: Yes Education handout provided: Previously provided Muscles treated: bilat suboccipitals; cervical paraspinals; L lower abdominals/rectus  Electrical stimulation performed: yes to  L lower abdominals; rectus Parameters: mAmp current Treatment response/outcome: decreased palpable tightness; decreased pain    TODAY'S TREATMENT:     [facility] Adult PT Treatment:                                                DATE: 09/02/23 Therapeutic Exercise: UBE L5 x 4 min alt fwd/bkwd Doorway stretch 3 positions 2 x 30 sec Manual Therapy: TPR & STM though the abdomen and fascia btn transverse abdominals and lats  Skilled palpation to assess response to manual work and dry needling  Myofacial release and scar massage abdominals  Trigger Point Dry-Needling  Treatment instructions: Expect mild to moderate muscle soreness. S/S of pneumothorax if dry needled over a lung field, and to seek immediate medical attention should they  occur. Patient verbalized understanding of these instructions and education. Patient Consent Given: Yes Education handout provided: Previously provided Muscles treated: bilat suboccipitals; cervical paraspinals; L lower abdominals; bilat transverse abdominals/lat junction  Electrical stimulation performed: yes to  L lower abdominals; bilat transverse abdominals/lat junction Parameters: mAmp current Treatment response/outcome: decreased palpable tightness; decreased pain     [facility] Adult PT Treatment:                                                DATE: 08/25/23 Therapeutic Exercise: UBE L5 x 4 min alt fwd/bkwd Doorway stretch 3 positions 2 x 30 sec Standing with noodle: L arms, W arms, scap squeeze x 20 Cat/cow Manual Therapy: TPR & STM though the abdomen and fascia btn transverse abdominals and lats  Skilled palpation to assess response to manual work and dry needling  Trigger Point Dry-Needling  Treatment instructions: Expect mild to moderate muscle soreness. S/S of pneumothorax if dry needled over a lung field, and to seek immediate medical attention should they occur. Patient verbalized  understanding of these instructions and education. Patient Consent Given: Yes Education handout provided: Previously provided Muscles treated: bilat lower abdominals to rectus  Electrical stimulation performed: yes Parameters: mAmp current Treatment response/outcome: decreased palpable tightness; decreased pain                                                                                                                    PATIENT EDUCATION:  Education details: TPDN, exam findings, POC, initial HEP Person educated: Patient Education method: Explanation, Demonstration, and Handouts Education comprehension: verbalized understanding, returned demonstration, and needs further education  HOME EXERCISE PROGRAM:  Access Code: [email or web address] URL: [email or web address] Date:  06/08/2023 Prepared by: Corlis Leak  Exercises - Seated Cervical Retraction  - 2 x daily - 7 x weekly - 1-2 sets - 5-10 reps - 10 sec  hold - Seated Scapular Retraction  - 2 x daily - 7 x weekly - 1-2 sets - 10 reps - 10 sec  hold - Standing Backward Shoulder Rolls  - 2 x daily - 7 x weekly - 1 sets - 10 reps - 1-2 sec  hold - Standing Cervical Retraction with Sidebending  - 2 x daily - 7 x weekly - 1 sets - 5-6 reps - 10-15 sec  hold - Supine Diaphragmatic Breathing  - 2 x daily - 7 x weekly - 1 sets - 10 reps - 4-6 sec  hold - Supine Lower Trunk Rotation  - 2 x daily - 7 x weekly - 1 sets - 3-5 reps - 20-30 sec  hold - Shoulder External Rotation and Scapular Retraction with Resistance  - 1 x daily - 7 x weekly - 2 sets - 10 reps - Shoulder W - External Rotation with Resistance  - 1 x daily - 7 x weekly - 2 sets - 10 reps - Standing Shoulder Horizontal Abduction with Resistance  - 1 x daily - 7 x weekly - 2 sets - 10 reps - Standing Bilateral Low Shoulder Row with Anchored Resistance  - 2 x daily - 7 x weekly - 1-3 sets - 10 reps - 2-3 sec  hold - Shoulder extension with resistance - Neutral  - 1 x daily - 7 x weekly - 1-2 sets - 10 reps - 3-5 sec  hold  ASSESSMENT:  CLINICAL IMPRESSION: Good progress with decrease in symptoms. Patient has decreased muscular tightness and fascial restrictions through the abdomen with less pulling into the low back and shoulder area. Note release of fascial restrictions bth the transverse abdominals and lats as well as through the rectus abdominals which has decreased back and scapular pain. Patient continues to progress with cervical ROM. She is now pain free with all motions. Headaches continue to occur 3-4 days a week. She is to schedule botox injections which are approved by insurance. She continues with increased mm tightness and facial restrictions through the abdomen following tummy tuck as well as through the posterior cervical and suboccipitals.     RE-EVAL: Patient initially demonstrated good improvement in  symptoms but has experienced a set back in the past 2 weeks with increased intensity of pain in the R side and shoulder blade area. Patient has a history of tummy tuck surgery ~ 2 years ago and has significant tightness to palpation through the abdomen into the R lateral trunk. She has tightness with lat stretch supine. Patient has limited trunk mobility related to abdominal restrictions. She will benefit from continued treatment to address facial restrictions and improve trunk mobility/ROM. She has continued headaches and increased pain in the R periscapular area with active trigger points. She has continued stretching and postural strengthening as well as adding exercises at home including burpees which may have contributed to the flare up of her symptoms. Patient will benefit from continued treatment to address problems identified.    OBJECTIVE IMPAIRMENTS:  multiple muscles in spasm including UTs, suboccipitals, and periscapular muscles likely related to her headaches and dizziness. Pt demos decreased cervical ROM with R>L rotator cuff weakness and decreased postural stability decreased activity tolerance, decreased endurance, decreased mobility, decreased ROM, decreased strength, increased fascial restrictions, increased muscle spasms, impaired UE functional use, postural dysfunction, and pain.    GOALS: Goals reviewed with patient? Yes  SHORT TERM GOALS: Target date: 06/26/2023   Pt will be ind with initial HEP Baseline:  Goal status: MET  2.  Pt will report >/=50% improvement in headaches Baseline:  Goal status: met  3.  Pt will be ind with maintaining postural stability for working at her desk Baseline:  Goal status: MET    LONG TERM GOALS: Target date: 09/24/2023  Pt will be ind with management and progression of HEP Baseline:  Goal status: on going   2.  Pt will be able to demo at least 5/5 periscapular muscles  for improved postural stability with no pain  Baseline:  Goal status: on going - revise   3.  Pt will report >/=75% improvement in pain Baseline:  Goal status: on going   4.  Pt will have increased FOTO score to >/=69 Baseline: 52 07/16/23: 54 Goal status: on going     PLAN:  PT FREQUENCY: 2x/week  PT DURATION: 8 weeks  PLANNED INTERVENTIONS: Therapeutic exercises, Therapeutic activity, Neuromuscular re-education, Balance training, Gait training, Patient/Family education, Self Care, Joint mobilization, Vestibular training, Aquatic Therapy, Dry Needling, Electrical stimulation, Spinal mobilization, Cryotherapy, Moist heat, Taping, Traction, Ionotophoresis 4mg /ml Dexamethasone, Manual therapy, and Re-evaluation  PLAN FOR NEXT SESSION: add prone shoulder strengthening, Thoracic mobility, Work on shoulder/rotator cuff strengthening as tolerated. Manual work/TPDN as indicated.    Val Riles, PT 09/16/2023, 2:52 PM

## 2023-09-22 ENCOUNTER — Ambulatory Visit: Payer: Managed Care, Other (non HMO) | Admitting: Adult Health

## 2023-09-22 DIAGNOSIS — G43709 Chronic migraine without aura, not intractable, without status migrainosus: Secondary | ICD-10-CM

## 2023-09-22 MED ORDER — UBRELVY 100 MG PO TABS: 100.0000 mg | ORAL_TABLET | ORAL | 11 refills | Status: DC | PRN

## 2023-09-22 MED ORDER — ONABOTULINUMTOXINA 200 UNITS IJ SOLR
155.0000 [IU] | Freq: Once | INTRAMUSCULAR | Status: AC
Start: 2023-09-22 — End: 2023-09-22
  Administered 2023-09-22: 155 [IU] via INTRAMUSCULAR

## 2023-09-22 NOTE — Progress Notes (Signed)
Patient is being seen today for initial Botox treatment.  Currently having about 15 migraine days per month. Use of Ubrelvy with some benefit. Reports nortriptyline worsened with migraines, no benefit with naratriptan or baclofen. Worked with PT with dry needling for cervicalgia with some benefit but wouldn't last long.  Tolerated procedure well today. Return in 3 months for repeat injection.       Consent Form Botulism Toxin Injection For Chronic Migraine    Reviewed orally with patient, additionally signature is on file:  Botulism toxin has been approved by the Federal drug administration for treatment of chronic migraine. Botulism toxin does not cure chronic migraine and it may not be effective in some patients.  The administration of botulism toxin is accomplished by injecting a small amount of toxin into the muscles of the neck and head. Dosage must be titrated for each individual. Any benefits resulting from botulism toxin tend to wear off after 3 months with a repeat injection required if benefit is to be maintained. Injections are usually done every 3-4 months with maximum effect peak achieved by about 2 or 3 weeks. Botulism toxin is expensive and you should be sure of what costs you will incur resulting from the injection.  The side effects of botulism toxin use for chronic migraine may include:   -Transient, and usually mild, facial weakness with facial injections  -Transient, and usually mild, head or neck weakness with head/neck injections  -Reduction or loss of forehead facial animation due to forehead muscle weakness  -Eyelid drooping  -Dry eye  -Pain at the site of injection or bruising at the site of injection  -Double vision  -Potential unknown long term risks   Contraindications: You should not have Botox if you are pregnant, nursing, allergic to albumin, have an infection, skin condition, or muscle weakness at the site of the injection, or have myasthenia  gravis, Lambert-Eaton syndrome, or ALS.  It is also possible that as with any injection, there may be an allergic reaction or no effect from the medication. Reduced effectiveness after repeated injections is sometimes seen and rarely infection at the injection site may occur. All care will be taken to prevent these side effects. If therapy is given over a long time, atrophy and wasting in the muscle injected may occur. Occasionally the patient's become refractory to treatment because they develop antibodies to the toxin. In this event, therapy needs to be modified.  I have read the above information and consent to the administration of botulism toxin.    BOTOX PROCEDURE NOTE FOR MIGRAINE HEADACHE  Contraindications and precautions discussed with patient(above). Aseptic procedure was observed and patient tolerated procedure. Procedure performed by Ihor Austin, AGNP-BC.   The condition has existed for more than 6 months, and pt does not have a diagnosis of ALS, Myasthenia Gravis or Lambert-Eaton Syndrome.  Risks and benefits of injections discussed and pt agrees to proceed with the procedure.  Written consent obtained  These injections are medically necessary. Pt  receives good benefits from these injections. These injections do not cause sedations or hallucinations which the oral therapies may cause.   Description of procedure:  The patient was placed in a sitting position. The standard protocol was used for Botox as follows, with 5 units of Botox injected at each site:  -Procerus muscle, midline injection  -Corrugator muscle, bilateral injection  -Frontalis muscle, bilateral injection, with 2 sites each side, medial injection was performed in the upper one third of the frontalis  muscle, in the region vertical from the medial inferior edge of the superior orbital rim. The lateral injection was again in the upper one third of the forehead vertically above the lateral limbus of the cornea,  1.5 cm lateral to the medial injection site.  -Temporalis muscle injection, 4 sites, bilaterally. The first injection was 3 cm above the tragus of the ear, second injection site was 1.5 cm to 3 cm up from the first injection site in line with the tragus of the ear. The third injection site was 1.5-3 cm forward between the first 2 injection sites. The fourth injection site was 1.5 cm posterior to the second injection site. 5th site laterally in the temporalis  muscleat the level of the outer canthus.  -Occipitalis muscle injection, 3 sites, bilaterally. The first injection was done one half way between the occipital protuberance and the tip of the mastoid process behind the ear. The second injection site was done lateral and superior to the first, 1 fingerbreadth from the first injection. The third injection site was 1 fingerbreadth superiorly and medially from the first injection site.  -Cervical paraspinal muscle injection, 2 sites, bilaterally. The first injection site was 1 cm from the midline of the cervical spine, 3 cm inferior to the lower border of the occipital protuberance. The second injection site was 1.5 cm superiorly and laterally to the first injection site.  -Trapezius muscle injection was performed at 3 sites, bilaterally. The first injection site was in the upper trapezius muscle halfway between the inflection point of the neck, and the acromion. The second injection site was one half way between the acromion and the first injection site. The third injection was done between the first injection site and the inflection point of the neck.    A total of 200 units of Botox was prepared, 155 units of Botox was injected as documented above, any Botox not injected was wasted. The patient tolerated the procedure well, there were no complications of the above procedure.   Ihor Austin, AGNP-BC  [facility] [address] [place], [place] [address]  Phone  [phone] Fax [phone] Note: This document was prepared with digital dictation and possible smart phrase technology. Any transcriptional errors that result from this process are unintentional.

## 2023-09-22 NOTE — Progress Notes (Signed)
Botox- 200 units x 1 vial Lot: [id number] Expiration: 01/2026 NDC: 8119-1478-29  Bacteriostatic 0.9% Sodium Chloride- * mL  Lot: [id number] Expiration: 03/15/2024 NDC: 8657-8469-62  Dx: 43.709 S/P Witnessed by Marlana Latus, RN

## 2023-09-24 ENCOUNTER — Ambulatory Visit: Payer: Managed Care, Other (non HMO) | Admitting: Rehabilitative and Restorative Service Providers"

## 2023-09-30 ENCOUNTER — Encounter: Payer: Managed Care, Other (non HMO) | Admitting: Rehabilitative and Restorative Service Providers"

## 2023-10-01 ENCOUNTER — Ambulatory Visit: Payer: Managed Care, Other (non HMO) | Admitting: Rehabilitative and Restorative Service Providers"

## 2023-10-01 ENCOUNTER — Encounter: Payer: Self-pay | Admitting: Rehabilitative and Restorative Service Providers"

## 2023-10-01 DIAGNOSIS — M546 Pain in thoracic spine: Secondary | ICD-10-CM

## 2023-10-01 DIAGNOSIS — R293 Abnormal posture: Secondary | ICD-10-CM

## 2023-10-01 DIAGNOSIS — M542 Cervicalgia: Secondary | ICD-10-CM | POA: Diagnosis not present

## 2023-10-01 DIAGNOSIS — M6281 Muscle weakness (generalized): Secondary | ICD-10-CM

## 2023-10-01 NOTE — Therapy (Signed)
OUTPATIENT PHYSICAL THERAPY CERVICAL TREATMENT RE-CERTIFICATION AND DISCHARGE SUMMARY  PHYSICAL THERAPY DISCHARGE SUMMARY  Visits from Start of Care: 18  Current functional level related to goals / functional outcomes: SEE PROGRESS NOTE FOR DISCHARGE STATUS    Remaining deficits: Persistent tightness abdominal area but significantly improved.    Education / Equipment: HEP   Patient agrees to discharge. Patient goals were met. Patient is being discharged due to meeting the stated rehab goals. Rocko Fesperman P. Leonor Liv PT, MPH 10/01/23 3:26 PM   Patient Name: Anna Robinson MRN: [id number] DOB:[birth year], 52 y.o., female Today's Date: 10/01/2023  END OF SESSION:  PT End of Session - 10/01/23 1445     Visit Number 18    Number of Visits 24    Date for PT Re-Evaluation 10/01/23    Authorization Type [employer]    PT Start Time 1445    PT Stop Time 1530    PT Time Calculation (min) 45 min    Activity Tolerance Patient tolerated treatment well                Past Medical History:  Diagnosis Date   Allergy    Anxiety    Constipation    GERD (gastroesophageal reflux disease)    IBS (irritable bowel syndrome)    PONV (postoperative nausea and vomiting)    Seasonal allergies    Past Surgical History:  Procedure Laterality Date   COLONOSCOPY     ~10 yrs- normal per pt    TOTAL ABDOMINAL HYSTERECTOMY     WISDOM TOOTH EXTRACTION     age [age]   Patient Active Problem List   Diagnosis Date Noted   Cervical neck pain with evidence of disc disease 05/06/2023   Migraine without status migrainosus, not intractable 04/13/2023   Nausea 04/13/2023   Muscle spasms of neck 04/13/2023   Post-menopausal 04/10/2023   DDD (degenerative disc disease), lumbar 12/01/2022   DDD (degenerative disc disease), 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   Mouth sores 12/01/2022   Lower abdominal pain 12/01/2022   Acute bilateral low back pain without sciatica 12/01/2022   Slow transit constipation 12/01/2022    Anxiety 05/30/2022   Chest pain 02/24/2022   Overweight (BMI 25.0-29.9) 02/24/2022   Gastroesophageal reflux disease without esophagitis 02/24/2022   Sinus arrhythmia seen on electrocardiogram 02/24/2022   Sinus bradycardia 02/24/2022   Class 1 obesity due to excess calories without serious comorbidity with body mass index (BMI) of 31.0 to 31.9 in adult 02/19/2021   Fibrocystic changes of left breast 05/15/2020   Class 1 obesity due to excess calories without serious comorbidity with body mass index (BMI) of 30.0 to 30.9 in adult 02/01/2020   Right ovarian cyst 06/02/2019   Elevated liver enzymes 02/14/2019   Vitamin D insufficiency 01/26/2019   Elevated fasting glucose 01/26/2019   Dyslipidemia 01/26/2019   Epigastric pain 02/15/2018   Fatigue 01/15/2018   Muscle cramps 03/02/2015    PCP: Jomarie Longs, [name]  REFERRING PROVIDER: Jomarie Longs, [name]  REFERRING DIAG: [phone] (ICD-10-CM) - Muscle spasms of neck M50.90 (ICD-10-CM) - Cervical neck pain with evidence of disc disease R51.9 (ICD-10-CM) - Frequent headaches  THERAPY DIAG:  Cervicalgia  Pain in thoracic spine  Muscle weakness (generalized)  Abnormal posture  Rationale for Evaluation and Treatment: Rehabilitation  ONSET DATE: January 2024  SUBJECTIVE:  SUBJECTIVE STATEMENT: Patient reports that she feels good following Botox injections last week. She has had only one HA in the past two weeks. Abdominal tightness is so much better. Can move easier. The dry needling helps tremendously. She is feeling better through the lower abdominals and flank. She has no pain with working out.  She has been doing her exercises - 3 days of strengthening with weights and 2 days of pilates and riding her bike at home.     Pt states  she has some soreness and pain in the R midback and in her neck. She did not sleep well last night. Her mother in law passed last week and she has been stressed with that and with return to work. She has a migraine today. Her work has been busy and she has been trying to catch up with work from being off last week. She has been doing her exercises this week. Significant improvement in headache and neck pain with treatment today.  Hand dominance: Right  PERTINENT HISTORY:  N/a  PAIN:  Are you having pain? Yes: NPRS scale: currently 5/10 in the R shoulder blade area; 6-7/10 HA Pain location: now feeling pain and tightness in the shoulder blade area  Pain description: tightness; throbbing; aching R side constant  Aggravating factors: not sure  Relieving factors: medication, chiropractor, massage, dry needling   PRECAUTIONS: None  WEIGHT BEARING RESTRICTIONS: No  FALLS:  Has patient fallen in last 6 months? 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 No large vessel occlusion or proximal hemodynamically significant stenosis. 2. No aneurysm identified.  IMPRESSION: Normal MRI of the brain.   PATIENT SURVEYS:  FOTO 58; predicted 69 07/16/23: 54 10/01/23: 86   POSTURE: rounded shoulders and forward head  PALPATION: TTP R periscapular muscles, thoracic paraspinals, bilat UTs, bilat cervical paraspinals and suboccipitals   CERVICAL ROM:   Active ROM A/PROM (deg) eval AROM  07/16/23 AROM 08/18/23 AROM 10/01/23  Flexion 30 57 57 55  Extension 45 45 50 55  Right lateral flexion 39 30 41 30  Left lateral flexion 30* 27 pain  41 30  Right rotation 44 65  60 65  Left rotation 52 63 tight  70 68   (Blank rows = not tested)  UPPER EXTREMITY ROM:  Active ROM Right eval Left eval Right  07/16/23 Right  10/01/23  Shoulder  flexion WFL pulls into mid back [facility] WNL's  WNL'd   Shoulder extension      Shoulder abduction WFL pulls into mid back [facility] WNL's  Tight R side  WFL's  Shoulder adduction      Shoulder extension      Shoulder internal rotation T10* T4 T8 pain  T7 tight   Shoulder external rotation T2 T2 WNL's  WNL's  Elbow flexion      Elbow extension      Wrist flexion      Wrist extension      Wrist ulnar deviation      Wrist radial deviation      Wrist pronation      Wrist supination       (Blank rows = not tested) * pain  UPPER EXTREMITY MMT:  MMT Right eval Left eval Right  07/16/23 Right  10/01/23 Left  07/16/23  Shoulder flexion 3+ 3+ 4 5 5   Shoulder  extension 4 5 5 5 5   Shoulder abduction 3+ 3+ 4 5 5   Shoulder adduction       Shoulder internal rotation 3 5 4+ 5 5  Shoulder external rotation 3+ 5 4 5 5   Middle trapezius       Lower trapezius       Elbow flexion       Elbow extension       Wrist flexion       Wrist extension       Wrist ulnar deviation       Wrist radial deviation       Wrist pronation       Wrist supination       Grip strength        (Blank rows = not tested)   TODAY'S TREATMENT:     [facility] Adult PT Treatment:                                                DATE: 10/01/23 Therapeutic Exercise: UBE L4 x 4 min alternating fwd/back each minute  Doorway stretch 30 sec x 3 positions x 1  Reviewed HEP Manual Therapy: TPR & STM though the abdomen and posterior cervical musculature   Skilled palpation to assess response to manual work and dry needling  Myofacial release and scar massage abdominals  Trigger Point Dry-Needling  Treatment instructions: Expect mild to moderate muscle soreness. Patient verbalized understanding of these instructions and education. Patient Consent Given: Yes Education handout provided: Previously provided Muscles treated: bilat suboccipitals; cervical paraspinals; L lower abdominals/rectus  Electrical stimulation performed: yes to  L  lower abdominals; rectus Parameters: mAmp current Treatment response/outcome: decreased palpable tightness; decreased pain    [facility] Adult PT Treatment:                                                DATE: 09/16/23 Therapeutic Exercise: Reviewed HEP Manual Therapy: TPR & STM though the abdomen and posterior cervical musculature   Skilled palpation to assess response to manual work and dry needling  Myofacial release and scar massage abdominals  Trigger Point Dry-Needling  Treatment instructions: Expect mild to moderate muscle soreness. Patient verbalized understanding of these instructions and education. Patient Consent Given: Yes Education handout provided: Previously provided Muscles treated: bilat suboccipitals; cervical paraspinals; L lower abdominals/rectus  Electrical stimulation performed: yes to  L lower abdominals; rectus Parameters: mAmp current Treatment response/outcome: decreased palpable tightness; decreased pain    TODAY'S TREATMENT:     [facility] Adult PT Treatment:                                                DATE: 09/02/23 Therapeutic Exercise: UBE L5 x 4 min alt fwd/bkwd Doorway stretch 3 positions 2 x 30 sec Manual Therapy: TPR & STM though the abdomen and fascia btn transverse abdominals and lats  Skilled palpation to assess response to manual work and dry needling  Myofacial release and scar massage abdominals  Trigger Point Dry-Needling  Treatment instructions: Expect mild to moderate muscle soreness. S/S of pneumothorax if dry needled over  a lung field, and to seek immediate medical attention should they occur. Patient verbalized understanding of these instructions and education. Patient Consent Given: Yes Education handout provided: Previously provided Muscles treated: bilat suboccipitals; cervical paraspinals; L lower abdominals; bilat transverse abdominals/lat junction  Electrical stimulation performed: yes to  L lower abdominals; bilat transverse  abdominals/lat junction Parameters: mAmp current Treatment response/outcome: decreased palpable tightness; decreased pain     [facility] Adult PT Treatment:                                                DATE: 08/25/23 Therapeutic Exercise: UBE L5 x 4 min alt fwd/bkwd Doorway stretch 3 positions 2 x 30 sec Standing with noodle: L arms, W arms, scap squeeze x 20 Cat/cow Manual Therapy: TPR & STM though the abdomen and fascia btn transverse abdominals and lats  Skilled palpation to assess response to manual work and dry needling  Trigger Point Dry-Needling  Treatment instructions: Expect mild to moderate muscle soreness. S/S of pneumothorax if dry needled over a lung field, and to seek immediate medical attention should they occur. Patient verbalized understanding of these instructions and education. Patient Consent Given: Yes Education handout provided: Previously provided Muscles treated: bilat lower abdominals to rectus  Electrical stimulation performed: yes Parameters: mAmp current Treatment response/outcome: decreased palpable tightness; decreased pain                                                                                                                    PATIENT EDUCATION:  Education details: TPDN, exam findings, POC, initial HEP Person educated: Patient Education method: Explanation, Demonstration, and Handouts Education comprehension: verbalized understanding, returned demonstration, and needs further education  HOME EXERCISE PROGRAM:  Access Code: [email or web address] URL: [email or web address] Date: 06/08/2023 Prepared by: Corlis Leak  Exercises - Seated Cervical Retraction  - 2 x daily - 7 x weekly - 1-2 sets - 5-10 reps - 10 sec  hold - Seated Scapular Retraction  - 2 x daily - 7 x weekly - 1-2 sets - 10 reps - 10 sec  hold - Standing Backward Shoulder Rolls  - 2 x daily - 7 x weekly - 1 sets - 10 reps - 1-2 sec  hold - Standing Cervical Retraction with  Sidebending  - 2 x daily - 7 x weekly - 1 sets - 5-6 reps - 10-15 sec  hold - Supine Diaphragmatic Breathing  - 2 x daily - 7 x weekly - 1 sets - 10 reps - 4-6 sec  hold - Supine Lower Trunk Rotation  - 2 x daily - 7 x weekly - 1 sets - 3-5 reps - 20-30 sec  hold - Shoulder External Rotation and Scapular Retraction with Resistance  - 1 x daily - 7 x weekly - 2 sets - 10 reps - Shoulder W - External Rotation with Resistance  - 1 x daily -  7 x weekly - 2 sets - 10 reps - Standing Shoulder Horizontal Abduction with Resistance  - 1 x daily - 7 x weekly - 2 sets - 10 reps - Standing Bilateral Low Shoulder Row with Anchored Resistance  - 2 x daily - 7 x weekly - 1-3 sets - 10 reps - 2-3 sec  hold - Shoulder extension with resistance - Neutral  - 1 x daily - 7 x weekly - 1-2 sets - 10 reps - 3-5 sec  hold  ASSESSMENT:  CLINICAL IMPRESSION: Good progress with decrease in symptoms. Patient has decreased muscular tightness and fascial restrictions through the abdomen with less pulling into the low back and shoulder area. Good release of soft tissue with treatment. Note release of fascial restrictions bth the transverse abdominals and lats as well as through the rectus abdominals which has decreased back and scapular pain. Patient demonstrates good progress with cervical ROM. She is now pain free with all motions. Headaches improved significantly following botox injections. She has had only one headache in the past two week. Goals of physical therapy have been accomplished and patient will discharged independent HEP.    RE-EVAL: Patient initially demonstrated good improvement in symptoms but has experienced a set back in the past 2 weeks with increased intensity of pain in the R side and shoulder blade area. Patient has a history of tummy tuck surgery ~ 2 years ago and has significant tightness to palpation through the abdomen into the R lateral trunk. She has tightness with lat stretch supine. Patient has  limited trunk mobility related to abdominal restrictions. She will benefit from continued treatment to address facial restrictions and improve trunk mobility/ROM. She has continued headaches and increased pain in the R periscapular area with active trigger points. She has continued stretching and postural strengthening as well as adding exercises at home including burpees which may have contributed to the flare up of her symptoms. Patient will benefit from continued treatment to address problems identified.    OBJECTIVE IMPAIRMENTS:  multiple muscles in spasm including UTs, suboccipitals, and periscapular muscles likely related to her headaches and dizziness. Pt demos decreased cervical ROM with R>L rotator cuff weakness and decreased postural stability decreased activity tolerance, decreased endurance, decreased mobility, decreased ROM, decreased strength, increased fascial restrictions, increased muscle spasms, impaired UE functional use, postural dysfunction, and pain.    GOALS: Goals reviewed with patient? Yes  SHORT TERM GOALS: Target date: 06/26/2023   Pt will be ind with initial HEP Baseline:  Goal status: MET  2.  Pt will report >/=50% improvement in headaches Baseline:  Goal status: met  3.  Pt will be ind with maintaining postural stability for working at her desk Baseline:  Goal status: MET    LONG TERM GOALS: Target date: 10/01/2023  Pt will be ind with management and progression of HEP Baseline:  Goal status: met   2.  Pt will be able to demo at least 5/5 periscapular muscles for improved postural stability with no pain  Baseline:  Goal status: met   3.  Pt will report >/=75% improvement in pain Baseline:  Goal status: met  4.  Pt will have increased FOTO score to >/=69 Baseline: 52 07/16/23: 54 10/01/23: 86 Goal status: met     PLAN:  PT FREQUENCY: 2x/week  PT DURATION: 8 weeks  PLANNED INTERVENTIONS: Therapeutic exercises, Therapeutic activity,  Neuromuscular re-education, Balance training, Gait training, Patient/Family education, Self Care, Joint mobilization, Vestibular training, Aquatic Therapy, Dry Needling, Electrical stimulation,  Spinal mobilization, Cryotherapy, Moist heat, Taping, Traction, Ionotophoresis 4mg /ml Dexamethasone, Manual therapy, and Re-evaluation  PLAN FOR NEXT SESSION: add prone shoulder strengthening, Thoracic mobility, Work on shoulder/rotator cuff strengthening as tolerated. Manual work/TPDN as indicated.    Val Riles, PT 10/01/2023, 3:26 PM

## 2023-10-13 ENCOUNTER — Ambulatory Visit
Admission: RE | Admit: 2023-10-13 | Discharge: 2023-10-13 | Disposition: A | Discharge status: Home / Self Care | Payer: Managed Care, Other (non HMO) | Source: Ambulatory Visit | Attending: Family Medicine | Admitting: Family Medicine

## 2023-10-13 VITALS — BP 116/81 | HR 57 | Temp 98.5°F | Resp 18

## 2023-10-13 DIAGNOSIS — J309 Allergic rhinitis, unspecified: Secondary | ICD-10-CM | POA: Diagnosis not present

## 2023-10-13 DIAGNOSIS — R059 Cough, unspecified: Secondary | ICD-10-CM | POA: Diagnosis not present

## 2023-10-13 DIAGNOSIS — J3489 Other specified disorders of nose and nasal sinuses: Secondary | ICD-10-CM | POA: Diagnosis not present

## 2023-10-13 DIAGNOSIS — J069 Acute upper respiratory infection, unspecified: Secondary | ICD-10-CM | POA: Diagnosis not present

## 2023-10-13 MED ORDER — FEXOFENADINE HCL 180 MG PO TABS: 180.0000 mg | ORAL_TABLET | Freq: Every day | ORAL | 0 refills | Status: DC

## 2023-10-13 MED ORDER — PROMETHAZINE-DM 6.25-15 MG/5ML PO SYRP: 5.0000 mL | ORAL_SOLUTION | Freq: Two times a day (BID) | ORAL | 0 refills | Status: DC | PRN

## 2023-10-13 MED ORDER — DOXYCYCLINE HYCLATE 100 MG PO CAPS: 100.0000 mg | ORAL_CAPSULE | Freq: Two times a day (BID) | ORAL | 0 refills | Status: AC

## 2023-10-13 MED ORDER — METHYLPREDNISOLONE SODIUM SUCC 125 MG IJ SOLR
125.0000 mg | Freq: Once | INTRAMUSCULAR | Status: AC
  Administered 2023-10-13: 125 mg via INTRAMUSCULAR

## 2023-10-13 MED ORDER — PREDNISONE 20 MG PO TABS: ORAL_TABLET | ORAL | 0 refills | Status: DC

## 2023-10-13 NOTE — ED Triage Notes (Signed)
Pt c/o productive cough (green sputum) x10 days. Pt reports pain in ribcage s/t coughing. Also c/o nasal congestion, R ear pain, and HA. Has concern for sinus infection. Little to no relief with Mucinex, Sudafed, and NyQuil.

## 2023-10-13 NOTE — Discharge Instructions (Addendum)
Advised patient to take medications as directed with food to completion.  Advised patient to take Prednisone and Allegra with first dose of doxycycline for the next 5 of 7 days.  Advised may use Promethazine DM at night for cough prior to sleep due to sedative effects.  Encouraged to increase daily water intake to 64 ounces per day while taking these medications.  Advised if symptoms worsen and/or unresolved please follow-up PCP or here for further evaluation.

## 2023-10-13 NOTE — ED Provider Notes (Signed)
[name]    CSN: [id number] Arrival date & time: 10/13/23  1425      History   Chief Complaint Chief Complaint  Patient presents with   Cough    HPI Anna Robinson is a 52 y.o. female.   HPI Pleasant 52 year old female presents with productive green sputum for 10 days reports pain in rib cage with coughing.  Additionally, reports nasal congestion, right ear pain and headache.  Patient is concerned that she has a sinus infection.  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   Mouth sores 12/01/2022   Lower abdominal pain 12/01/2022   Acute bilateral low back pain without sciatica 12/01/2022   Slow transit constipation 12/01/2022   Anxiety 05/30/2022   Chest pain 02/24/2022   Overweight (BMI 25.0-29.9) 02/24/2022   Gastroesophageal reflux disease without esophagitis 02/24/2022   Sinus arrhythmia seen on electrocardiogram 02/24/2022   Sinus bradycardia 02/24/2022   Class 1 obesity due to excess calories without serious comorbidity with body mass index (BMI) of 31.0 to 31.9 in adult 02/19/2021   Fibrocystic changes of left breast 05/15/2020   Class 1 obesity due to excess calories without serious comorbidity with body mass index (BMI) of 30.0 to 30.9 in adult 02/01/2020    Right ovarian cyst 06/02/2019   Elevated liver enzymes 02/14/2019   Vitamin D insufficiency 01/26/2019   Elevated fasting glucose 01/26/2019   Dyslipidemia 01/26/2019   Epigastric pain 02/15/2018   Fatigue 01/15/2018   Muscle cramps 03/02/2015    Past Surgical History:  Procedure Laterality Date   COLONOSCOPY     ~10 yrs- normal per pt    TOTAL ABDOMINAL HYSTERECTOMY     WISDOM TOOTH EXTRACTION     age [age]    OB History     Gravida  3   Para  2   Term  2   Preterm      AB  1   Living         SAB      IAB  1   Ectopic      Multiple      Live Births               Home Medications    Prior to Admission medications   Medication Sig Start Date End Date Taking? Authorizing Provider  botulinum toxin Type A (BOTOX) 200 units injection Injection 155 units into the muscles of the head and neck every 3 months discard remainder 09/03/23  Yes Chima, Victorino Dike, MD  doxycycline (VIBRAMYCIN) 100 MG capsule Take 1 capsule (100 mg total) by mouth 2 (two) times daily for 7 days. 10/13/23 10/20/23 Yes Trevor Iha, FNP  fexofenadine [facility] ALLERGY) 180 MG tablet Take 1 tablet (180 mg total) by mouth daily for 15 days. 10/13/23  10/28/23 Yes Trevor Iha, FNP  linaclotide (LINZESS) 290 MCG CAPS capsule Take 1 capsule (290 mcg total) by mouth daily. 05/27/23  Yes Breeback, Jade L, [name]  omeprazole (PRILOSEC) 40 MG capsule Take 40 mg by mouth daily. 09/19/23  Yes [provider]  predniSONE (DELTASONE) 20 MG tablet Take 3 tabs PO daily x 5 days. 10/13/23  Yes Trevor Iha, FNP  promethazine-dextromethorphan (PROMETHAZINE-DM) 6.25-15 MG/5ML syrup Take 5 mLs by mouth 2 (two) times daily as needed for cough. 10/13/23  Yes Trevor Iha, FNP  UBRELVY 100 MG TABS Take 1 tablet (100 mg total) by mouth as needed. Repeat x1 after 2 hrs if needed, max dose 200mg /24hr 09/22/23  Yes McCue, Shanda Bumps, NP  ALPRAZolam (XANAX) 0.25 MG tablet TAKE 1 TABLET BY MOUTH AT BEDTIME AS  NEEDED FOR ANXIETY. 06/09/23   Breeback, Jade L, [name]  diphenhydrAMINE (BENADRYL) 25 mg capsule Take 25 mg by mouth every 6 (six) hours as needed.    [provider]  ondansetron (ZOFRAN) 4 MG tablet TAKE 1 TABLET BY MOUTH EVERY 8 HOURS AS NEEDED FOR NAUSEA AND VOMITING 08/24/23   Jomarie Longs, [name]    Family History Family History  Problem Relation Age of Onset   Heart disease Mother    Diabetes Mother    Hyperlipidemia Mother    Hypertension Mother    Stroke Mother    Breast cancer Mother    Cancer Father    Brain cancer Father    Lung cancer Father    Anuerysm Father    Depression Maternal Aunt    Heart disease Maternal Grandmother    Esophageal cancer Sister    Colon cancer Neg Hx    Colon polyps Neg Hx    Rectal cancer Neg Hx    Stomach cancer Neg Hx     Social History Social History   Tobacco Use   Smoking status: Never   Smokeless tobacco: Never  Vaping Use   Vaping status: Never Used  Substance Use Topics   Alcohol use: Not Currently    Comment: rare - not often per pt    Drug use: No     Allergies   Penicillins and Wellbutrin [bupropion]   Review of Systems Review of Systems   Physical Exam Triage Vital Signs ED Triage Vitals  Encounter Vitals Group     BP 10/13/23 1442 116/81     Systolic BP Percentile --      Diastolic BP Percentile --      Pulse Rate 10/13/23 1441 (!) 57     Resp 10/13/23 1441 18     Temp 10/13/23 1441 98.5 F (36.9 C)     Temp Source 10/13/23 1441 Oral     SpO2 10/13/23 1441 95 %     Weight --      Height --      Head Circumference --      Peak Flow --      Pain Score 10/13/23 1436 5     Pain Loc --      Pain Education --      Exclude from Growth Chart --    No data found.  Updated Vital Signs BP 116/81   Pulse (!) 57   Temp 98.5 F (36.9 C) (Oral)   Resp 18   SpO2 95%    Physical Exam Vitals and nursing note reviewed.  Constitutional:      Appearance: Normal appearance. She is obese. She is  ill-appearing.  HENT:  Head: Normocephalic and atraumatic.     Right Ear: Tympanic membrane and external ear normal.     Left Ear: Tympanic membrane and external ear normal.     Ears:     Comments: Moderate eustachian tube dysfunction noted bilaterally    Nose:     Right Sinus: Maxillary sinus tenderness and frontal sinus tenderness present.     Left Sinus: Maxillary sinus tenderness and frontal sinus tenderness present.     Comments: Turbinates are erythematous/edematous    Mouth/Throat:     Mouth: Mucous membranes are moist.     Pharynx: Oropharynx is clear.  Eyes:     Extraocular Movements: Extraocular movements intact.     Conjunctiva/sclera: Conjunctivae normal.     Pupils: Pupils are equal, round, and reactive to light.  Cardiovascular:     Rate and Rhythm: Normal rate and regular rhythm.     Pulses: Normal pulses.     Heart sounds: Normal heart sounds.  Pulmonary:     Effort: Pulmonary effort is normal.     Breath sounds: Normal breath sounds. No wheezing, rhonchi or rales.     Comments: Infrequent nonproductive cough noted on exam Abdominal:     Tenderness: There is no right CVA tenderness or left CVA tenderness.  Musculoskeletal:        General: Normal range of motion.     Cervical back: Normal range of motion and neck supple.  Skin:    General: Skin is warm and dry.  Neurological:     General: No focal deficit present.     Mental Status: She is alert and oriented to person, place, and time. Mental status is at baseline.  Psychiatric:        Mood and Affect: Mood normal.        Behavior: Behavior normal.      UC Treatments / Results  Labs (all labs ordered are listed, but only abnormal results are displayed) Labs Reviewed - No data to display  EKG   Radiology No results found.  Procedures Procedures (including critical care time)  Medications Ordered in UC Medications  methylPREDNISolone sodium succinate (SOLU-MEDROL) 125 mg/2 mL injection 125 mg  (125 mg Intramuscular Given 10/13/23 1524)    Initial Impression / Assessment and Plan / UC Course  I have reviewed the triage vital signs and the nursing notes.  Pertinent labs & imaging results that were available during my care of the patient were reviewed by me and considered in my medical decision making (see chart for details).     MDM: 1.  Acute upper respiratory infection-Rx'd doxycycline 100 mg capsule: Take 1 capsule twice daily x 7 days; 2.  Cough, unspecified type-Rx'd prednisone 60 mg daily x 5 days, promethazine DM 6.25-15 Mg/5 mL syrup: Take 5 mL by mouth 3 times daily as needed for cough: 3.  Sinus pressure-IM Solu-Medrol 125 mg given once and clinic and prior to discharge per patient request; 4.  Allergic rhinitis, unspecified seasonality, unspecified trigger-Rx'd Allegra fexofenadine 180 mg tablet daily x 5 days, as needed for concurrent postnasal drainage/drip. Advised patient to take medications as directed with food to completion.  Advised patient to take Prednisone and Allegra with first dose of doxycycline for the next 5 of 7 days.  Advised may use Promethazine DM at night for cough prior to sleep due to sedative effects.  Encouraged to increase daily water intake to 64 ounces per day while taking these medications.  Advised if symptoms worsen and/or unresolved please  follow-up PCP or here for further evaluation.  Final Clinical Impressions(s) / UC Diagnoses   Final diagnoses:  Acute upper respiratory infection  Cough, unspecified type  Allergic rhinitis, unspecified seasonality, unspecified trigger  Sinus pressure     Discharge Instructions      Advised patient to take medications as directed with food to completion.  Advised patient to take Prednisone and Allegra with first dose of doxycycline for the next 5 of 7 days.  Advised may use Promethazine DM at night for cough prior to sleep due to sedative effects.  Encouraged to increase daily water intake to 64 ounces per  day while taking these medications.  Advised if symptoms worsen and/or unresolved please follow-up PCP or here for further evaluation.     ED Prescriptions     Medication Sig Dispense Auth. Provider   doxycycline (VIBRAMYCIN) 100 MG capsule Take 1 capsule (100 mg total) by mouth 2 (two) times daily for 7 days. 14 capsule Trevor Iha, FNP   predniSONE (DELTASONE) 20 MG tablet Take 3 tabs PO daily x 5 days. 15 tablet Trevor Iha, FNP   fexofenadine [facility] ALLERGY) 180 MG tablet Take 1 tablet (180 mg total) by mouth daily for 15 days. 15 tablet Trevor Iha, FNP   promethazine-dextromethorphan (PROMETHAZINE-DM) 6.25-15 MG/5ML syrup Take 5 mLs by mouth 2 (two) times daily as needed for cough. 118 mL Trevor Iha, FNP      PDMP not reviewed this encounter.   Trevor Iha, FNP 10/13/23 1547

## 2023-10-21 ENCOUNTER — Ambulatory Visit: Payer: Managed Care, Other (non HMO) | Admitting: Adult Health

## 2023-12-21 NOTE — Telephone Encounter (Signed)
 LVM and sent MyChart msg asking pt for new insurance, Anna Robinson is inactive.

## 2023-12-21 NOTE — Telephone Encounter (Signed)
 Pt called back and gave new [facility] insurance info. I submitted an auth via [facility] portal and received instant approval, pt will be buy/bill.  Auth#: [id number] (12/21/23-12/20/24)

## 2023-12-24 ENCOUNTER — Encounter: Payer: Self-pay | Admitting: Adult Health

## 2023-12-24 ENCOUNTER — Ambulatory Visit (INDEPENDENT_AMBULATORY_CARE_PROVIDER_SITE_OTHER): Payer: 59 | Admitting: Adult Health

## 2023-12-24 DIAGNOSIS — M542 Cervicalgia: Secondary | ICD-10-CM

## 2023-12-24 DIAGNOSIS — G43709 Chronic migraine without aura, not intractable, without status migrainosus: Secondary | ICD-10-CM

## 2023-12-24 MED ORDER — ONABOTULINUMTOXINA 100 UNITS IJ SOLR
155.0000 [IU] | Freq: Once | INTRAMUSCULAR | Status: DC
Start: 2023-12-24 — End: 2023-12-24

## 2023-12-24 MED ORDER — ONABOTULINUMTOXINA 200 UNITS IJ SOLR
155.0000 [IU] | Freq: Once | INTRAMUSCULAR | Status: AC
Start: 2023-12-24 — End: 2023-12-24
  Administered 2023-12-24: 155 [IU] via INTRAMUSCULAR

## 2023-12-24 NOTE — Progress Notes (Signed)
 Botox- 200 units x 1 vial Lot: [id number] Expiration: 02/2026 NDC: 5621-3086-57  Bacteriostatic 0.9% Sodium Chloride- * mL  Lot: [id number] Expiration: 10/15/2024 NDC: 952-8413-24  Dx: 43.709 B/B Witnessed by Randa Evens, CMA

## 2023-12-24 NOTE — Progress Notes (Signed)
 Update 12/24/2023 JM: Returns for second Botox  injection.  Prior injection 09/22/2023.  She reports significant improvement with Botox . She is having about 2-3 migraines per month (previously 15/month). She has had some increase migraines over the past 1-2 weeks. Use of Ubrelvy  with benefit. Tolerated procedure well today.  Return in 3 months for repeat injection.        Consent Form Botulism Toxin Injection For Chronic Migraine    Reviewed orally with patient, additionally signature is on file:  Botulism toxin has been approved by the Federal drug administration for treatment of chronic migraine. Botulism toxin does not cure chronic migraine and it may not be effective in some patients.  The administration of botulism toxin is accomplished by injecting a small amount of toxin into the muscles of the neck and head. Dosage must be titrated for each individual. Any benefits resulting from botulism toxin tend to wear off after 3 months with a repeat injection required if benefit is to be maintained. Injections are usually done every 3-4 months with maximum effect peak achieved by about 2 or 3 weeks. Botulism toxin is expensive and you should be sure of what costs you will incur resulting from the injection.  The side effects of botulism toxin use for chronic migraine may include:   -Transient, and usually mild, facial weakness with facial injections  -Transient, and usually mild, head or neck weakness with head/neck injections  -Reduction or loss of forehead facial animation due to forehead muscle weakness  -Eyelid drooping  -Dry eye  -Pain at the site of injection or bruising at the site of injection  -Double vision  -Potential unknown long term risks   Contraindications: You should not have Botox  if you are pregnant, nursing, allergic to albumin, have an infection, skin condition, or muscle weakness at the site of the injection, or have myasthenia gravis, Lambert-Eaton syndrome, or  ALS.  It is also possible that as with any injection, there may be an allergic reaction or no effect from the medication. Reduced effectiveness after repeated injections is sometimes seen and rarely infection at the injection site may occur. All care will be taken to prevent these side effects. If therapy is given over a long time, atrophy and wasting in the muscle injected may occur. Occasionally the patient's become refractory to treatment because they develop antibodies to the toxin. In this event, therapy needs to be modified.  I have read the above information and consent to the administration of botulism toxin.    BOTOX  PROCEDURE NOTE FOR MIGRAINE HEADACHE  Contraindications and precautions discussed with patient(above). Aseptic procedure was observed and patient tolerated procedure. Procedure performed by Harlene Bogaert, AGNP-BC.   The condition has existed for more than 6 months, and pt does not have a diagnosis of ALS, Myasthenia Gravis or Lambert-Eaton Syndrome.  Risks and benefits of injections discussed and pt agrees to proceed with the procedure.  Written consent obtained  These injections are medically necessary. Pt  receives good benefits from these injections. These injections do not cause sedations or hallucinations which the oral therapies may cause.   Description of procedure:  The patient was placed in a sitting position. The standard protocol was used for Botox  as follows, with 5 units of Botox  injected at each site:  -Procerus muscle, midline injection  -Corrugator muscle, bilateral injection  -Frontalis muscle, bilateral injection, with 2 sites each side, medial injection was performed in the upper one third of the frontalis muscle, in the region  vertical from the medial inferior edge of the superior orbital rim. The lateral injection was again in the upper one third of the forehead vertically above the lateral limbus of the cornea, 1.5 cm lateral to the medial  injection site.  -Temporalis muscle injection, 4 sites, bilaterally. The first injection was 3 cm above the tragus of the ear, second injection site was 1.5 cm to 3 cm up from the first injection site in line with the tragus of the ear. The third injection site was 1.5-3 cm forward between the first 2 injection sites. The fourth injection site was 1.5 cm posterior to the second injection site. 5th site laterally in the temporalis  muscleat the level of the outer canthus.  -Occipitalis muscle injection, 3 sites, bilaterally. The first injection was done one half way between the occipital protuberance and the tip of the mastoid process behind the ear. The second injection site was done lateral and superior to the first, 1 fingerbreadth from the first injection. The third injection site was 1 fingerbreadth superiorly and medially from the first injection site.  -Cervical paraspinal muscle injection, 2 sites, bilaterally. The first injection site was 1 cm from the midline of the cervical spine, 3 cm inferior to the lower border of the occipital protuberance. The second injection site was 1.5 cm superiorly and laterally to the first injection site.  -Trapezius muscle injection was performed at 3 sites, bilaterally. The first injection site was in the upper trapezius muscle halfway between the inflection point of the neck, and the acromion. The second injection site was one half way between the acromion and the first injection site. The third injection was done between the first injection site and the inflection point of the neck.    A total of 200 units of Botox  was prepared, 155 units of Botox  was injected as documented above, any Botox  not injected was wasted. The patient tolerated the procedure well, there were no complications of the above procedure.   Harlene Bogaert, AGNP-BC  [facility] [address] [place], [place] [address]  Phone [phone] Fax  [phone] Note: This document was prepared with digital dictation and possible smart phrase technology. Any transcriptional errors that result from this process are unintentional.

## 2024-02-03 ENCOUNTER — Telehealth: Payer: Self-pay | Admitting: Adult Health

## 2024-02-03 NOTE — Telephone Encounter (Signed)
Pt rescheduled appointment due to going to be out of town for work

## 2024-02-03 NOTE — Telephone Encounter (Signed)
Pt requesting a copy of receipt showing paid bill yesterday. Need copy to file claim with insurance.

## 2024-03-04 ENCOUNTER — Telehealth: Payer: Self-pay

## 2024-03-04 NOTE — Telephone Encounter (Signed)
 Copied from CRM [id number]. Topic: Clinical - Prescription Issue >> Mar 04, 2024 11:26 AM Nila Nephew wrote: Reason for CRM: Patient calling to state that a PA is needed for  linaclotide (LINZESS) 290 MCG CAPS capsule or an alternative needs to be prescribed that patient's insurance will cover. Please advise.

## 2024-03-08 NOTE — Telephone Encounter (Signed)
 PA request. Thanks in advance.

## 2024-03-09 ENCOUNTER — Telehealth: Payer: Self-pay

## 2024-03-09 ENCOUNTER — Other Ambulatory Visit (HOSPITAL_COMMUNITY): Payer: Self-pay

## 2024-03-09 ENCOUNTER — Ambulatory Visit
Admission: EM | Admit: 2024-03-09 | Discharge: 2024-03-09 | Disposition: A | Discharge status: Home / Self Care | Attending: Family Medicine | Admitting: Family Medicine

## 2024-03-09 DIAGNOSIS — K219 Gastro-esophageal reflux disease without esophagitis: Secondary | ICD-10-CM | POA: Diagnosis not present

## 2024-03-09 DIAGNOSIS — R0789 Other chest pain: Secondary | ICD-10-CM | POA: Diagnosis not present

## 2024-03-09 MED ORDER — LIDOCAINE VISCOUS HCL 2 % MT SOLN
15.0000 mL | Freq: Once | OROMUCOSAL | Status: AC
  Administered 2024-03-09: 15 mL via ORAL

## 2024-03-09 MED ORDER — ALUM & MAG HYDROXIDE-SIMETH 200-200-20 MG/5ML PO SUSP
30.0000 mL | Freq: Once | ORAL | Status: AC
  Administered 2024-03-09: 30 mL via ORAL

## 2024-03-09 NOTE — Discharge Instructions (Addendum)
 Advised patient to take previously prescribed Omeprazole 40 mg daily for the next 7 to 10 days.  Advised patient to take medication on empty stomach with 8 to 12 ounces of water and no other medications for a minimum of 45 minutes when taking this medication.  Advised if symptoms worsen and/or unresolved please follow-up with your PCP for further evaluation.

## 2024-03-09 NOTE — Telephone Encounter (Signed)
 Pharmacy Patient Advocate Encounter   Received notification from Pt Calls Messages that prior authorization for Linzess 290 is required/requested.   Insurance verification completed.   The patient is insured through [facility] .   Per test claim: PA required; PA submitted to above mentioned insurance via CoverMyMeds Key/confirmation #/EOC [facility] Status is pending

## 2024-03-09 NOTE — ED Triage Notes (Signed)
 Pt c/o intermittent CP x 2 weeks. Has tried aspirin, gas x and omeprazole prn with no relief. Hx of anxiety. Recently stopped Linzess bc insurance stopped covering it.

## 2024-03-09 NOTE — ED Provider Notes (Signed)
 [facility]    CSN: [id number] Arrival date & time: 03/09/24  1647      History   Chief Complaint Chief Complaint  Patient presents with   Chest Pain    HPI Anna Robinson is a 53 y.o. female.   HPI 53 year old female presents with chest pain x 2 weeks.  Patient reports has tried aspirin, Gas-X and omeprazole as needed with no relief.  Additionally, patient reports stopped Linzess because insurance stopped covering it.  PMH significant for obesity, anxiety, and IBS.  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   Mouth sores 12/01/2022   Lower abdominal pain 12/01/2022   Acute bilateral low back pain without sciatica 12/01/2022   Slow transit constipation 12/01/2022   Anxiety 05/30/2022   Chest pain 02/24/2022   Overweight (BMI 25.0-29.9) 02/24/2022   Gastroesophageal reflux disease without esophagitis 02/24/2022   Sinus arrhythmia seen on electrocardiogram 02/24/2022   Sinus bradycardia 02/24/2022   Class 1 obesity due to excess calories without serious comorbidity with body mass index (BMI) of 31.0 to 31.9 in adult 02/19/2021   Fibrocystic changes of left breast 05/15/2020   Class 1 obesity due to excess calories without serious comorbidity with body mass index (BMI) of 30.0 to 30.9 in adult 02/01/2020   Right ovarian cyst 06/02/2019   Elevated liver enzymes 02/14/2019   Vitamin D insufficiency 01/26/2019    Elevated fasting glucose 01/26/2019   Dyslipidemia 01/26/2019   Epigastric pain 02/15/2018   Fatigue 01/15/2018   Muscle cramps 03/02/2015    Past Surgical History:  Procedure Laterality Date   COLONOSCOPY     ~10 yrs- normal per pt    TOTAL ABDOMINAL HYSTERECTOMY     WISDOM TOOTH EXTRACTION     age [age]    OB History     Gravida  3   Para  2   Term  2   Preterm      AB  1   Living         SAB      IAB  1   Ectopic      Multiple      Live Births               Home Medications    Prior to Admission medications   Medication Sig Start Date End Date Taking? Authorizing Provider  ALPRAZolam (XANAX) 0.25 MG tablet TAKE 1 TABLET BY MOUTH AT BEDTIME AS NEEDED FOR ANXIETY. Patient not taking: Reported on 12/24/2023 06/09/23   Tandy Gaw L, [name]  botulinum toxin Type A (BOTOX) 200 units injection Injection 155 units into the muscles of the head and neck every 3 months discard remainder 09/03/23   Ocie Doyne, MD  diphenhydrAMINE (BENADRYL) 25 mg capsule Take 25 mg by mouth every 6 (six) hours as needed.    [provider]  fexofenadine (ALLEGRA ALLERGY) 180 MG tablet Take 1  tablet (180 mg total) by mouth daily for 15 days. 10/13/23 10/28/23  Trevor Iha, FNP  linaclotide (LINZESS) 290 MCG CAPS capsule Take 1 capsule (290 mcg total) by mouth daily. 05/27/23   Breeback, Lonna Cobb, [name]  omeprazole (PRILOSEC) 40 MG capsule Take 40 mg by mouth daily. 09/19/23   [provider]  ondansetron (ZOFRAN) 4 MG tablet TAKE 1 TABLET BY MOUTH EVERY 8 HOURS AS NEEDED FOR NAUSEA AND VOMITING 08/24/23   Breeback, Jade L, [name]  UBRELVY 100 MG TABS Take 1 tablet (100 mg total) by mouth as needed. Repeat x1 after 2 hrs if needed, max dose 200mg /24hr 09/22/23   Ihor Austin, NP    Family History Family History  Problem Relation Age of Onset   Heart disease Mother    Diabetes Mother    Hyperlipidemia Mother    Hypertension Mother    Stroke Mother    Breast  cancer Mother    Cancer Father    Brain cancer Father    Lung cancer Father    Anuerysm Father    Depression Maternal Aunt    Heart disease Maternal Grandmother    Esophageal cancer Sister    Colon cancer Neg Hx    Colon polyps Neg Hx    Rectal cancer Neg Hx    Stomach cancer Neg Hx     Social History Social History   Tobacco Use   Smoking status: Never   Smokeless tobacco: Never  Vaping Use   Vaping status: Never Used  Substance Use Topics   Alcohol use: Not Currently    Comment: rare - not often per pt    Drug use: No     Allergies   Penicillins and Wellbutrin [bupropion]   Review of Systems Review of Systems  Cardiovascular:  Positive for chest pain.  All other systems reviewed and are negative.    Physical Exam Triage Vital Signs ED Triage Vitals  Encounter Vitals Group     BP      Systolic BP Percentile      Diastolic BP Percentile      Pulse      Resp      Temp      Temp src      SpO2      Weight      Height      Head Circumference      Peak Flow      Pain Score      Pain Loc      Pain Education      Exclude from Growth Chart    No data found.  Updated Vital Signs BP 126/83 (BP Location: Right Arm)   Pulse 75   Temp 98 F (36.7 C) (Oral)   Resp 17   SpO2 96%    Physical Exam Vitals and nursing note reviewed.  Constitutional:      Appearance: Normal appearance. She is normal weight. She is not ill-appearing.  HENT:     Head: Normocephalic and atraumatic.     Mouth/Throat:     Mouth: Mucous membranes are moist.     Pharynx: Oropharynx is clear.  Eyes:     Extraocular Movements: Extraocular movements intact.     Conjunctiva/sclera: Conjunctivae normal.     Pupils: Pupils are equal, round, and reactive to light.  Cardiovascular:     Rate and Rhythm: Normal rate and regular rhythm.     Pulses: Normal pulses.     Heart sounds: Normal heart sounds.  Pulmonary:     Effort: Pulmonary effort is normal.     Breath sounds: Normal  breath sounds. No wheezing, rhonchi or rales.  Musculoskeletal:        General: Normal range of motion.     Cervical back: Normal range of motion and neck supple.  Skin:    General: Skin is warm and dry.  Neurological:     General: No focal deficit present.     Mental Status: She is alert and oriented to person, place, and time. Mental status is at baseline.  Psychiatric:        Mood and Affect: Mood normal.        Behavior: Behavior normal.      UC Treatments / Results  Labs (all labs ordered are listed, but only abnormal results are displayed) Labs Reviewed - No data to display  EKG   Radiology No results found.  Procedures Procedures (including critical care time)  Medications Ordered in UC Medications  alum & mag hydroxide-simeth (MAALOX/MYLANTA) 200-200-20 MG/5ML suspension 30 mL (30 mLs Oral Given 03/09/24 1738)    And  lidocaine (XYLOCAINE) 2 % viscous mouth solution 15 mL (15 mLs Oral Given 03/09/24 1738)    Initial Impression / Assessment and Plan / UC Course  I have reviewed the triage vital signs and the nursing notes.  Pertinent labs & imaging results that were available during my care of the patient were reviewed by me and considered in my medical decision making (see chart for details).     MDM: 1. Atypical chest pain-EKG reveals normal sinus rhythm, same as 12-lead EKG from 03/05/2022; 2.  GERD, unspecified whether esophagitis present GI (Maalox/Mylanta/Xylocaine) cocktail given once and clinic. Advised patient may try OTC omeprazole 20 mg daily for the next 7 to 10 days.  Advised patient to take medication on empty stomach with 8 to 12 ounces of water and no other medications for a minimum of 45 minutes when taking this medication.  Advised if symptoms worsen and/or unresolved please follow-up with your PCP for further evaluation.  Patient discharged home, hemodynamically stable.  Final Clinical Impressions(s) / UC Diagnoses   Final diagnoses:  Atypical  chest pain  Gastroesophageal reflux disease, unspecified whether esophagitis present     Discharge Instructions      Advised patient to take previously prescribed Omeprazole 40 mg daily for the next 7 to 10 days.  Advised patient to take medication on empty stomach with 8 to 12 ounces of water and no other medications for a minimum of 45 minutes when taking this medication.  Advised if symptoms worsen and/or unresolved please follow-up with your PCP for further evaluation.     ED Prescriptions   None    PDMP not reviewed this encounter.   Trevor Iha, FNP 03/09/24 1755

## 2024-03-10 ENCOUNTER — Other Ambulatory Visit (HOSPITAL_COMMUNITY): Payer: Self-pay

## 2024-03-10 ENCOUNTER — Telehealth: Payer: Self-pay

## 2024-03-10 NOTE — Telephone Encounter (Signed)
 Pharmacy Patient Advocate Encounter  Received notification from [facility] that Prior Authorization for Linzess 290 has been approved. Unable to get co-pay price because refill too soon until 04/01/24   PA #/Case ID/Reference #:  [id number]

## 2024-03-14 ENCOUNTER — Other Ambulatory Visit (HOSPITAL_COMMUNITY): Payer: Self-pay

## 2024-03-15 ENCOUNTER — Other Ambulatory Visit (HOSPITAL_COMMUNITY): Payer: Self-pay

## 2024-03-15 NOTE — Telephone Encounter (Signed)
 Pharmacy Patient Advocate Encounter  Received notification from [facility] that Prior Authorization for {Linzess 290 has been APPROVED from 03/09/24 to 03/09/25. Unable to obtain price due to refill too soon rejection, last fill date 03/09/24 next available fill date4/18/25. Maximum day supply of 31   PA #/Case ID/Reference #: [id number]

## 2024-03-21 ENCOUNTER — Ambulatory Visit: Payer: 59 | Admitting: Adult Health

## 2024-03-30 ENCOUNTER — Encounter: Payer: Self-pay | Admitting: Physician Assistant

## 2024-03-30 ENCOUNTER — Other Ambulatory Visit (HOSPITAL_COMMUNITY): Payer: Self-pay

## 2024-03-30 ENCOUNTER — Ambulatory Visit (INDEPENDENT_AMBULATORY_CARE_PROVIDER_SITE_OTHER): Payer: Self-pay | Admitting: Physician Assistant

## 2024-03-30 ENCOUNTER — Telehealth: Payer: Self-pay

## 2024-03-30 VITALS — BP 108/55 | HR 55 | Ht 65.0 in | Wt 190.0 lb

## 2024-03-30 DIAGNOSIS — Z0000 Encounter for general adult medical examination without abnormal findings: Secondary | ICD-10-CM

## 2024-03-30 DIAGNOSIS — E785 Hyperlipidemia, unspecified: Secondary | ICD-10-CM

## 2024-03-30 DIAGNOSIS — K5901 Slow transit constipation: Secondary | ICD-10-CM | POA: Diagnosis not present

## 2024-03-30 DIAGNOSIS — Z6831 Body mass index (BMI) 31.0-31.9, adult: Secondary | ICD-10-CM

## 2024-03-30 DIAGNOSIS — Z131 Encounter for screening for diabetes mellitus: Secondary | ICD-10-CM

## 2024-03-30 DIAGNOSIS — K219 Gastro-esophageal reflux disease without esophagitis: Secondary | ICD-10-CM

## 2024-03-30 DIAGNOSIS — R110 Nausea: Secondary | ICD-10-CM

## 2024-03-30 DIAGNOSIS — E559 Vitamin D deficiency, unspecified: Secondary | ICD-10-CM | POA: Diagnosis not present

## 2024-03-30 DIAGNOSIS — E66811 Obesity, class 1: Secondary | ICD-10-CM

## 2024-03-30 DIAGNOSIS — G43709 Chronic migraine without aura, not intractable, without status migrainosus: Secondary | ICD-10-CM

## 2024-03-30 DIAGNOSIS — E6609 Other obesity due to excess calories: Secondary | ICD-10-CM

## 2024-03-30 MED ORDER — PANTOPRAZOLE SODIUM 40 MG PO TBEC
40.0000 mg | DELAYED_RELEASE_TABLET | Freq: Every day | ORAL | 3 refills | Status: AC
Start: 2024-03-30 — End: ?

## 2024-03-30 MED ORDER — ZEPBOUND 5 MG/0.5ML ~~LOC~~ SOAJ
5.0000 mg | SUBCUTANEOUS | 1 refills | Status: DC
Start: 2024-04-29 — End: 2024-07-26

## 2024-03-30 MED ORDER — ONDANSETRON HCL 4 MG PO TABS
ORAL_TABLET | ORAL | 2 refills | Status: DC
Start: 2024-03-30 — End: 2024-09-23

## 2024-03-30 MED ORDER — LINACLOTIDE 290 MCG PO CAPS
290.0000 ug | ORAL_CAPSULE | Freq: Every day | ORAL | 3 refills | Status: AC
Start: 2024-03-30 — End: ?

## 2024-03-30 MED ORDER — ZEPBOUND 2.5 MG/0.5ML ~~LOC~~ SOAJ
2.5000 mg | SUBCUTANEOUS | 0 refills | Status: DC
Start: 2024-03-30 — End: 2024-07-26

## 2024-03-30 NOTE — Telephone Encounter (Signed)
 Pharmacy Patient Advocate Encounter   Received notification from Patient Pharmacy that prior authorization for Zepbound 2.5 is required/requested.   Insurance verification completed.   The patient is insured through [facility] .   Per test claim:  [id number]

## 2024-03-30 NOTE — Telephone Encounter (Signed)
 Pharmacy Patient Advocate Encounter  Received notification from [employer] that Prior Authorization for Zepbound 2.5 has been APPROVED from 03/30/24 to 09/29/24. Ran test claim, Copay is $24.99 for a 28 day supply. This test claim was processed through [facility]- copay amounts may vary at other pharmacies due to pharmacy/plan contracts, or as the patient moves through the different stages of their insurance plan.   PA #/Case ID/Reference #: [id number]

## 2024-03-30 NOTE — Patient Instructions (Signed)

## 2024-03-31 ENCOUNTER — Other Ambulatory Visit (HOSPITAL_COMMUNITY): Payer: Self-pay

## 2024-03-31 LAB — LIPID PANEL
Chol/HDL Ratio: 6.2 ratio — ABNORMAL HIGH (ref 0.0–4.4)
Cholesterol, Total: 193 mg/dL (ref 100–199)
HDL: 31 mg/dL — ABNORMAL LOW (ref >39)
LDL Chol Calc (NIH): 122 mg/dL — ABNORMAL HIGH (ref 0–99)
Triglycerides: 226 mg/dL — ABNORMAL HIGH (ref 0–149)
VLDL Cholesterol Cal: 40 mg/dL (ref 5–40)

## 2024-03-31 LAB — CBC WITH DIFFERENTIAL/PLATELET
Basophils Absolute: 0 10*3/uL (ref 0.0–0.2)
Basos: 0 %
EOS (ABSOLUTE): 0.2 10*3/uL (ref 0.0–0.4)
Eos: 2 %
Hematocrit: 44.7 % (ref 34.0–46.6)
Hemoglobin: 14.8 g/dL (ref 11.1–15.9)
Immature Grans (Abs): 0 10*3/uL (ref 0.0–0.1)
Immature Granulocytes: 0 %
Lymphocytes Absolute: 2.6 10*3/uL (ref 0.7–3.1)
Lymphs: 37 %
MCH: 28.8 pg (ref 26.6–33.0)
MCHC: 33.1 g/dL (ref 31.5–35.7)
MCV: 87 fL (ref 79–97)
Monocytes Absolute: 0.5 10*3/uL (ref 0.1–0.9)
Monocytes: 7 %
Neutrophils Absolute: 3.8 10*3/uL (ref 1.4–7.0)
Neutrophils: 54 %
Platelets: 201 10*3/uL (ref 150–450)
RBC: 5.14 x10E6/uL (ref 3.77–5.28)
RDW: 13.7 % (ref 11.7–15.4)
WBC: 7.1 10*3/uL (ref 3.4–10.8)

## 2024-03-31 LAB — CMP14+EGFR
ALT: 49 IU/L — ABNORMAL HIGH (ref 0–32)
AST: 32 IU/L (ref 0–40)
Albumin: 4.5 g/dL (ref 3.8–4.9)
Alkaline Phosphatase: 124 IU/L — ABNORMAL HIGH (ref 44–121)
BUN/Creatinine Ratio: 20 (ref 9–23)
BUN: 17 mg/dL (ref 6–24)
Bilirubin Total: 0.9 mg/dL (ref 0.0–1.2)
CO2: 23 mmol/L (ref 20–29)
Calcium: 9.4 mg/dL (ref 8.7–10.2)
Chloride: 104 mmol/L (ref 96–106)
Creatinine, Ser: 0.87 mg/dL (ref 0.57–1.00)
Globulin, Total: 2.1 g/dL (ref 1.5–4.5)
Glucose: 101 mg/dL — ABNORMAL HIGH (ref 70–99)
Potassium: 4 mmol/L (ref 3.5–5.2)
Sodium: 141 mmol/L (ref 134–144)
Total Protein: 6.6 g/dL (ref 6.0–8.5)
eGFR: 79 mL/min/{1.73_m2} (ref >59)

## 2024-03-31 LAB — TSH: TSH: 1.56 u[IU]/mL (ref 0.450–4.500)

## 2024-03-31 LAB — B12 AND FOLATE PANEL
Folate: 9.9 ng/mL (ref >3.0)
Vitamin B-12: >2000 pg/mL — ABNORMAL HIGH (ref 232–1245)

## 2024-03-31 LAB — VITAMIN D 25 HYDROXY (VIT D DEFICIENCY, FRACTURES): Vit D, 25-Hydroxy: 146.4 ng/mL — ABNORMAL HIGH (ref 30.0–100.0)

## 2024-04-04 NOTE — Progress Notes (Unsigned)
 Complete physical exam  Patient: Anna Robinson   DOB: 20-Aug-1971   53 y.o. Female  MRN: [id number]  Subjective:    Chief Complaint  Patient presents with   Annual Exam    Fasting labs    Anna Robinson is a 53 y.o. female who presents today for a complete physical exam. She reports consuming a general diet. {types:19826} She generally feels {DESC; WELL/FAIRLY WELL/POORLY:18703}. She reports sleeping {DESC; WELL/FAIRLY WELL/POORLY:18703}. She {does/does not:200015} have additional problems to discuss today.    Most recent fall risk assessment:    04/13/2023    4:09 PM  Fall Risk   Falls in the past year? 1  Number falls in past yr: 1  Injury with Fall? 0  Risk for fall due to : Impaired balance/gait     Most recent depression screenings:    04/13/2023    4:09 PM 03/30/2023    8:36 AM  PHQ 2/9 Scores  PHQ - 2 Score 0 0    Vision:Within last year and Dental: No current dental problems and Receives regular dental care  Patient Active Problem List   Diagnosis Date Noted   Cervical neck pain with evidence of disc disease 05/06/2023   Migraine without status migrainosus, not intractable 04/13/2023   Nausea 04/13/2023   Muscle spasms of neck 04/13/2023   Post-menopausal 04/10/2023   DDD (degenerative disc disease), lumbar 12/01/2022   DDD (degenerative disc disease), 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   Mouth sores 12/01/2022   Lower abdominal pain 12/01/2022   Acute bilateral low back pain without sciatica 12/01/2022   Slow transit constipation 12/01/2022   Anxiety 05/30/2022   Chest pain 02/24/2022   Overweight (BMI 25.0-29.9) 02/24/2022   Gastroesophageal reflux disease without esophagitis 02/24/2022   Sinus arrhythmia seen on electrocardiogram 02/24/2022   Sinus bradycardia 02/24/2022   Class 1 obesity due to excess calories without serious comorbidity with body mass index (BMI) of 31.0 to 31.9 in adult 02/19/2021   Fibrocystic changes of left breast 05/15/2020   Class 1  obesity due to excess calories without serious comorbidity with body mass index (BMI) of 30.0 to 30.9 in adult 02/01/2020   Right ovarian cyst 06/02/2019   Elevated liver enzymes 02/14/2019   Vitamin D  insufficiency 01/26/2019   Elevated fasting glucose 01/26/2019   Dyslipidemia 01/26/2019   Epigastric pain 02/15/2018   Fatigue 01/15/2018   Muscle cramps 03/02/2015   Past Medical History:  Diagnosis Date   Allergy    Anxiety    Constipation    GERD (gastroesophageal reflux disease)    IBS (irritable bowel syndrome)    PONV (postoperative nausea and vomiting)    Seasonal allergies    Past Surgical History:  Procedure Laterality Date   COLONOSCOPY     ~10 yrs- normal per pt    TOTAL ABDOMINAL HYSTERECTOMY     WISDOM TOOTH EXTRACTION     age [age]   Family History  Problem Relation Age of Onset   Heart disease Mother    Diabetes Mother    Hyperlipidemia Mother    Hypertension Mother    Stroke Mother    Breast cancer Mother    Cancer Father    Brain cancer Father    Lung cancer Father    Anuerysm Father    Depression Maternal Aunt    Heart disease Maternal Grandmother    Esophageal cancer Sister    Colon cancer Neg Hx    Colon polyps Neg Hx    Rectal cancer Neg  Hx    Stomach cancer Neg Hx    Allergies  Allergen Reactions   Penicillins Other (See Comments) and Rash    Chest tightness   Wellbutrin  [Bupropion ]     nosebleeds      Patient Care Team: Perfecto Purdy L, [name] as PCP - General (Family Medicine)   Outpatient Medications Prior to Visit  Medication Sig   botulinum toxin Type A  (BOTOX ) 200 units injection Injection 155 units into the muscles of the head and neck every 3 months discard remainder   UBRELVY  100 MG TABS Take 1 tablet (100 mg total) by mouth as needed. Repeat x1 after 2 hrs if needed, max dose 200mg /24hr   [DISCONTINUED] omeprazole  (PRILOSEC) 40 MG capsule Take 40 mg by mouth daily.   [DISCONTINUED] ondansetron  (ZOFRAN ) 4 MG tablet TAKE 1  TABLET BY MOUTH EVERY 8 HOURS AS NEEDED FOR NAUSEA AND VOMITING   fexofenadine  (ALLEGRA  ALLERGY) 180 MG tablet Take 1 tablet (180 mg total) by mouth daily for 15 days.   [DISCONTINUED] ALPRAZolam  (XANAX ) 0.25 MG tablet TAKE 1 TABLET BY MOUTH AT BEDTIME AS NEEDED FOR ANXIETY. (Patient not taking: Reported on 03/30/2024)   [DISCONTINUED] diphenhydrAMINE (BENADRYL) 25 mg capsule Take 25 mg by mouth every 6 (six) hours as needed.   [DISCONTINUED] linaclotide  (LINZESS ) 290 MCG CAPS capsule Take 1 capsule (290 mcg total) by mouth daily. (Patient not taking: Reported on 03/30/2024)   No facility-administered medications prior to visit.    ROS        Objective:     BP (!) 108/55   Pulse (!) 55   Ht 5\' 5"  (1.651 m)   Wt 190 lb (86.2 kg)   SpO2 100%   BMI 31.62 kg/m  BP Readings from Last 3 Encounters:  03/30/24 (!) 108/55  03/09/24 126/83  10/13/23 116/81   Wt Readings from Last 3 Encounters:  03/30/24 190 lb (86.2 kg)  07/20/23 187 lb 3.2 oz (84.9 kg)  05/06/23 188 lb (85.3 kg)      Physical Exam        Assessment & Plan:    Routine Health Maintenance and Physical Exam  Immunization History  Administered Date(s) Administered   Influenza, Quadrivalent, Recombinant, Inj, Pf 09/22/2019   Influenza,inj,Quad PF,6+ Mos 09/12/2021   Influenza-Unspecified 09/11/2017, 10/13/2018, 09/28/2021   Tdap 02/28/2015   Zoster Recombinant(Shingrix ) 01/31/2020, 04/11/2020    Health Maintenance  Topic Date Due   MAMMOGRAM  05/05/2024   INFLUENZA VACCINE  07/15/2024   DTaP/Tdap/Td (2 - Td or Tdap) 02/27/2025   Colonoscopy  02/16/2030   Hepatitis C Screening  Completed   HIV Screening  Completed   Zoster Vaccines- Shingrix   Completed   HPV VACCINES  Aged Out   Meningococcal B Vaccine  Aged Out   COVID-19 Vaccine  Discontinued    Discussed health benefits of physical activity, and encouraged her to engage in regular exercise appropriate for her age and condition.   Return in  about 3 months (around 06/29/2024) for weight.     Antwone Capozzoli, [name]

## 2024-04-05 ENCOUNTER — Other Ambulatory Visit: Payer: Self-pay | Admitting: Adult Health

## 2024-04-05 ENCOUNTER — Encounter: Payer: Self-pay | Admitting: Physician Assistant

## 2024-04-05 DIAGNOSIS — G43709 Chronic migraine without aura, not intractable, without status migrainosus: Secondary | ICD-10-CM

## 2024-04-05 NOTE — Progress Notes (Signed)
 Anna Robinson,  Liver enzymes improving.   TG up some and LDL(bad cholesterol) not to goal.   10 year risk is still low. Weight loss can help get these numbers down.   Anna Robinson.The 10-year ASCVD risk score (Arnett DK, et al., 2019) is: 2.2%   Values used to calculate the score:     Age: 53 years     Sex: Female     Is Non-Hispanic African American: No     Diabetic: No     Tobacco smoker: No     Systolic Blood Pressure: 108 mmHg     Is BP treated: No     HDL Cholesterol: 31 mg/dL     Total Cholesterol: 193 mg/dL  Vitamin D  is too high!!! Stop for 1 month and then start back at half the dose. How much were you taking?   B12 too high too. Stop for 1 month and then start back at half the dose.   Thyroid looks good.

## 2024-04-06 ENCOUNTER — Ambulatory Visit: Payer: 59 | Admitting: Adult Health

## 2024-04-06 ENCOUNTER — Encounter: Payer: Self-pay | Admitting: Adult Health

## 2024-04-06 ENCOUNTER — Encounter: Payer: Self-pay | Admitting: Physician Assistant

## 2024-04-06 ENCOUNTER — Telehealth: Payer: Self-pay

## 2024-04-06 ENCOUNTER — Other Ambulatory Visit (HOSPITAL_COMMUNITY): Payer: Self-pay

## 2024-04-06 DIAGNOSIS — G43709 Chronic migraine without aura, not intractable, without status migrainosus: Secondary | ICD-10-CM | POA: Diagnosis not present

## 2024-04-06 DIAGNOSIS — M542 Cervicalgia: Secondary | ICD-10-CM

## 2024-04-06 MED ORDER — VYEPTI 100 MG/ML IV SOLN: 100.0000 mg | INTRAVENOUS | 4 refills | Status: AC

## 2024-04-06 MED ORDER — ONABOTULINUMTOXINA 200 UNITS IJ SOLR
155.0000 [IU] | Freq: Once | INTRAMUSCULAR | Status: AC
Start: 2024-04-06 — End: 2024-04-06
  Administered 2024-04-06: 155 [IU] via INTRAMUSCULAR

## 2024-04-06 NOTE — Telephone Encounter (Signed)
 Please initiate PA

## 2024-04-06 NOTE — Progress Notes (Signed)
 Botox - 200 units x 1 vial Lot: [id number]  Expiration: 03/2026 NDC: 1610-9604-54  Bacteriostatic 0.9% Sodium Chloride - 4 mL  Lot: [id number] Expiration: 05/06/2024 NDC: 1478-2956-21  Dx: H08.657  B/B Witnessed by Delray Fielding, CMA

## 2024-04-06 NOTE — Progress Notes (Signed)
 Update 12/24/2023 JM: Returns for repeat Botox  injection.  Prior injection 12/24/2023.  Reports overall continued benefit with Botox , continues to have about 2-3 migraines per month but experiences almost daily migraines 2 to 3 weeks prior to next injection. Use of Ubrelvy  with benefit but will run out the 3rd month, only receives 8/month (assuming d/t insurance). Patient is interested in adding Vyepti  to alternate with Botox , will start process to obtain insurance approval. She will continue Ubrelvy  as needed.  Tolerated procedure well today.  Return in 3 months for repeat injection.        Consent Form Botulism Toxin Injection For Chronic Migraine    Reviewed orally with patient, additionally signature is on file:  Botulism toxin has been approved by the Federal drug administration for treatment of chronic migraine. Botulism toxin does not cure chronic migraine and it may not be effective in some patients.  The administration of botulism toxin is accomplished by injecting a small amount of toxin into the muscles of the neck and head. Dosage must be titrated for each individual. Any benefits resulting from botulism toxin tend to wear off after 3 months with a repeat injection required if benefit is to be maintained. Injections are usually done every 3-4 months with maximum effect peak achieved by about 2 or 3 weeks. Botulism toxin is expensive and you should be sure of what costs you will incur resulting from the injection.  The side effects of botulism toxin use for chronic migraine may include:   -Transient, and usually mild, facial weakness with facial injections  -Transient, and usually mild, head or neck weakness with head/neck injections  -Reduction or loss of forehead facial animation due to forehead muscle weakness  -Eyelid drooping  -Dry eye  -Pain at the site of injection or bruising at the site of injection  -Double vision  -Potential unknown long term  risks   Contraindications: You should not have Botox  if you are pregnant, nursing, allergic to albumin, have an infection, skin condition, or muscle weakness at the site of the injection, or have myasthenia gravis, Lambert-Eaton syndrome, or ALS.  It is also possible that as with any injection, there may be an allergic reaction or no effect from the medication. Reduced effectiveness after repeated injections is sometimes seen and rarely infection at the injection site may occur. All care will be taken to prevent these side effects. If therapy is given over a long time, atrophy and wasting in the muscle injected may occur. Occasionally the patient's become refractory to treatment because they develop antibodies to the toxin. In this event, therapy needs to be modified.  I have read the above information and consent to the administration of botulism toxin.    BOTOX  PROCEDURE NOTE FOR MIGRAINE HEADACHE  Contraindications and precautions discussed with patient(above). Aseptic procedure was observed and patient tolerated procedure. Procedure performed by Johny Nap, AGNP-BC.   The condition has existed for more than 6 months, and pt does not have a diagnosis of ALS, Myasthenia Gravis or Lambert-Eaton Syndrome.  Risks and benefits of injections discussed and pt agrees to proceed with the procedure.  Written consent obtained  These injections are medically necessary. Pt  receives good benefits from these injections. These injections do not cause sedations or hallucinations which the oral therapies may cause.   Description of procedure:  The patient was placed in a sitting position. The standard protocol was used for Botox  as follows, with 5 units of Botox  injected at each  site:  -Procerus muscle, midline injection  -Corrugator muscle, bilateral injection  -Frontalis muscle, bilateral injection, with 2 sites each side, medial injection was performed in the upper one third of the frontalis  muscle, in the region vertical from the medial inferior edge of the superior orbital rim. The lateral injection was again in the upper one third of the forehead vertically above the lateral limbus of the cornea, 1.5 cm lateral to the medial injection site.  -Temporalis muscle injection, 4 sites, bilaterally. The first injection was 3 cm above the tragus of the ear, second injection site was 1.5 cm to 3 cm up from the first injection site in line with the tragus of the ear. The third injection site was 1.5-3 cm forward between the first 2 injection sites. The fourth injection site was 1.5 cm posterior to the second injection site. 5th site laterally in the temporalis  muscleat the level of the outer canthus.  -Occipitalis muscle injection, 3 sites, bilaterally. The first injection was done one half way between the occipital protuberance and the tip of the mastoid process behind the ear. The second injection site was done lateral and superior to the first, 1 fingerbreadth from the first injection. The third injection site was 1 fingerbreadth superiorly and medially from the first injection site.  -Cervical paraspinal muscle injection, 2 sites, bilaterally. The first injection site was 1 cm from the midline of the cervical spine, 3 cm inferior to the lower border of the occipital protuberance. The second injection site was 1.5 cm superiorly and laterally to the first injection site.  -Trapezius muscle injection was performed at 3 sites, bilaterally. The first injection site was in the upper trapezius muscle halfway between the inflection point of the neck, and the acromion. The second injection site was one half way between the acromion and the first injection site. The third injection was done between the first injection site and the inflection point of the neck.    A total of 200 units of Botox  was prepared, 155 units of Botox  was injected as documented above, any Botox  not injected was wasted. The  patient tolerated the procedure well, there were no complications of the above procedure.   Johny Nap, AGNP-BC  [facility] [address] [place], [place] [address]  Phone [phone] Fax [phone] Note: This document was prepared with digital dictation and possible smart phrase technology. Any transcriptional errors that result from this process are unintentional.

## 2024-04-06 NOTE — Telephone Encounter (Signed)
 Pharmacy Patient Advocate Encounter   Received notification from RX Request Messages that prior authorization for Ubrelvy  100MG  tablets is required/requested.   Insurance verification completed.   The patient is insured through [facility] .   Per test claim: PA required; PA submitted to above mentioned insurance via CoverMyMeds Key/confirmation #/EOC [id number] Status is pending

## 2024-04-07 ENCOUNTER — Other Ambulatory Visit (HOSPITAL_COMMUNITY): Payer: Self-pay

## 2024-04-07 ENCOUNTER — Other Ambulatory Visit: Payer: Self-pay | Admitting: *Deleted

## 2024-04-07 ENCOUNTER — Telehealth: Payer: Self-pay

## 2024-04-07 DIAGNOSIS — G43709 Chronic migraine without aura, not intractable, without status migrainosus: Secondary | ICD-10-CM

## 2024-04-07 MED ORDER — UBRELVY 100 MG PO TABS
ORAL_TABLET | ORAL | 11 refills | Status: AC
Start: 2024-04-07 — End: ?

## 2024-04-07 NOTE — Telephone Encounter (Signed)
 Pharmacy Patient Advocate Encounter  Received notification from [facility] that Prior Authorization for Ubrelvy  100MG  tablets has been APPROVED from 04/06/2024 to 04/06/2025 with QUANTITY LIMIT of 8 tabs for a 30 day supply.   PA #/Case ID/Reference #: PA Case ID #: [id number]

## 2024-04-07 NOTE — Telephone Encounter (Signed)
 Vyepti  paperwork completed and given to [facility].

## 2024-05-04 ENCOUNTER — Telehealth: Payer: Self-pay

## 2024-05-04 ENCOUNTER — Telehealth: Payer: Self-pay | Admitting: Physician Assistant

## 2024-05-04 NOTE — Telephone Encounter (Signed)
 Patient came into office to drop off FMLA Paperwork, forms placed in Benedict box, thanks.

## 2024-05-04 NOTE — Telephone Encounter (Signed)
 Copied from CRM [id number]. Topic: General - Other >> May 04, 2024  9:44 AM Retta Caster wrote: Reason for CRM: Patient called in for clarification if she has to bring in the new form for FMLA for 2025 or can PCP use old paperwork. Needs call back on clarification and update.  [phone]

## 2024-05-04 NOTE — Telephone Encounter (Signed)
 Pls contact the pt to bring new 2025 FMLA documentation for completion by PCP.  Thx

## 2024-06-20 ENCOUNTER — Other Ambulatory Visit: Payer: Self-pay | Admitting: Physician Assistant

## 2024-06-20 DIAGNOSIS — E66811 Obesity, class 1: Secondary | ICD-10-CM

## 2024-06-23 ENCOUNTER — Other Ambulatory Visit: Payer: Self-pay

## 2024-06-23 DIAGNOSIS — E66811 Obesity, class 1: Secondary | ICD-10-CM

## 2024-06-23 NOTE — Telephone Encounter (Signed)
 Copied from CRM [id number]. Topic: Clinical - Prescription Issue >> Jun 22, 2024  2:52 PM Alfonso ORN wrote: Reason for CRM: Patient stated pharmacy will not fill her medication for the  tirzepatide  (ZEPBOUND ) 5 MG/0.5ML Pen due to patient do not have any refills Patient is completely out of the medication   Please call patient to let know the status of the refill   [id number] - [place], [place] - [address] [place] [address] Phone: [phone] Fax: [phone]

## 2024-06-23 NOTE — Telephone Encounter (Signed)
 Requesting rx rf of Zepbound  5mg   Last written 04/29/2024 Last OV 03/30/2024 Upcoming appt 06/29/2024

## 2024-06-24 MED ORDER — ZEPBOUND 7.5 MG/0.5ML ~~LOC~~ SOAJ
7.5000 mg | SUBCUTANEOUS | 0 refills | Status: DC
Start: 2024-06-24 — End: 2024-07-26

## 2024-06-29 ENCOUNTER — Ambulatory Visit: Admitting: Physician Assistant

## 2024-06-30 ENCOUNTER — Other Ambulatory Visit (HOSPITAL_COMMUNITY): Payer: Self-pay

## 2024-07-04 ENCOUNTER — Ambulatory Visit (INDEPENDENT_AMBULATORY_CARE_PROVIDER_SITE_OTHER): Admitting: Adult Health

## 2024-07-04 VITALS — BP 112/72 | HR 67

## 2024-07-04 DIAGNOSIS — G43709 Chronic migraine without aura, not intractable, without status migrainosus: Secondary | ICD-10-CM

## 2024-07-04 MED ORDER — ONABOTULINUMTOXINA 200 UNITS IJ SOLR
155.0000 [IU] | Freq: Once | INTRAMUSCULAR | Status: AC
  Administered 2024-07-04: 155 [IU] via INTRAMUSCULAR

## 2024-07-04 NOTE — Progress Notes (Signed)
 Botox - 200 units x 1 vial Lot: [id number]            Expiration: 09/2026 NDC: 9976-6078-97   Bacteriostatic 0.9% Sodium Chloride - 4 mL  Lot: [id number] Expiration: 10/14/2024 NDC: 9590-8033-97   Dx: H56.290  B/B Witnessed by Augustin NOVAK

## 2024-07-04 NOTE — Progress Notes (Signed)
 Update 07/04/2024 JM: Returns for repeat Botox  injection.  Prior injection 04/06/2024.  Reports continued benefit with Botox  and has since started Vyepti  with first infusion 6 weeks ago.  Has noticed improvement of headaches without significant wearing off prior to next Botox  and less severe migraines when they do occur.  Reports about 5 migraines in the month of June and 3 so far this month. Previously experiencing daily migraines 2 to 3 weeks prior to next Botox  injection.  Use of Ubrelvy  with benefit, no longer needing to take 2nd dose. She has been undergoing increased stress over the past few weeks which she feels has been contributing. Tolerated procedure well today.  Return in 3 months for repeat injection.        Consent Form Botulism Toxin Injection For Chronic Migraine    Reviewed orally with patient, additionally signature is on file:  Botulism toxin has been approved by the Federal drug administration for treatment of chronic migraine. Botulism toxin does not cure chronic migraine and it may not be effective in some patients.  The administration of botulism toxin is accomplished by injecting a small amount of toxin into the muscles of the neck and head. Dosage must be titrated for each individual. Any benefits resulting from botulism toxin tend to wear off after 3 months with a repeat injection required if benefit is to be maintained. Injections are usually done every 3-4 months with maximum effect peak achieved by about 2 or 3 weeks. Botulism toxin is expensive and you should be sure of what costs you will incur resulting from the injection.  The side effects of botulism toxin use for chronic migraine may include:   -Transient, and usually mild, facial weakness with facial injections  -Transient, and usually mild, head or neck weakness with head/neck injections  -Reduction or loss of forehead facial animation due to forehead muscle weakness  -Eyelid drooping  -Dry  eye  -Pain at the site of injection or bruising at the site of injection  -Double vision  -Potential unknown long term risks   Contraindications: You should not have Botox  if you are pregnant, nursing, allergic to albumin, have an infection, skin condition, or muscle weakness at the site of the injection, or have myasthenia gravis, Lambert-Eaton syndrome, or ALS.  It is also possible that as with any injection, there may be an allergic reaction or no effect from the medication. Reduced effectiveness after repeated injections is sometimes seen and rarely infection at the injection site may occur. All care will be taken to prevent these side effects. If therapy is given over a long time, atrophy and wasting in the muscle injected may occur. Occasionally the patient's become refractory to treatment because they develop antibodies to the toxin. In this event, therapy needs to be modified.  I have read the above information and consent to the administration of botulism toxin.    BOTOX  PROCEDURE NOTE FOR MIGRAINE HEADACHE  Contraindications and precautions discussed with patient(above). Aseptic procedure was observed and patient tolerated procedure. Procedure performed by Harlene Bogaert, AGNP-BC.   The condition has existed for more than 6 months, and pt does not have a diagnosis of ALS, Myasthenia Gravis or Lambert-Eaton Syndrome.  Risks and benefits of injections discussed and pt agrees to proceed with the procedure.  Written consent obtained  These injections are medically necessary. Pt  receives good benefits from these injections. These injections do not cause sedations or hallucinations which the oral therapies may cause.   Description  of procedure:  The patient was placed in a sitting position. The standard protocol was used for Botox  as follows, with 5 units of Botox  injected at each site:  -Procerus muscle, midline injection  -Corrugator muscle, bilateral injection  -Frontalis  muscle, bilateral injection, with 2 sites each side, medial injection was performed in the upper one third of the frontalis muscle, in the region vertical from the medial inferior edge of the superior orbital rim. The lateral injection was again in the upper one third of the forehead vertically above the lateral limbus of the cornea, 1.5 cm lateral to the medial injection site.  -Temporalis muscle injection, 4 sites, bilaterally. The first injection was 3 cm above the tragus of the ear, second injection site was 1.5 cm to 3 cm up from the first injection site in line with the tragus of the ear. The third injection site was 1.5-3 cm forward between the first 2 injection sites. The fourth injection site was 1.5 cm posterior to the second injection site. 5th site laterally in the temporalis  muscleat the level of the outer canthus.  -Occipitalis muscle injection, 3 sites, bilaterally. The first injection was done one half way between the occipital protuberance and the tip of the mastoid process behind the ear. The second injection site was done lateral and superior to the first, 1 fingerbreadth from the first injection. The third injection site was 1 fingerbreadth superiorly and medially from the first injection site.  -Cervical paraspinal muscle injection, 2 sites, bilaterally. The first injection site was 1 cm from the midline of the cervical spine, 3 cm inferior to the lower border of the occipital protuberance. The second injection site was 1.5 cm superiorly and laterally to the first injection site.  -Trapezius muscle injection was performed at 3 sites, bilaterally. The first injection site was in the upper trapezius muscle halfway between the inflection point of the neck, and the acromion. The second injection site was one half way between the acromion and the first injection site. The third injection was done between the first injection site and the inflection point of the neck.    A total of 200  units of Botox  was prepared, 155 units of Botox  was injected as documented above, any Botox  not injected was wasted. The patient tolerated the procedure well, there were no complications of the above procedure.   Harlene Bogaert, AGNP-BC  [facility] [address] [place], [place] [address]  Phone [phone] Fax [phone] Note: This document was prepared with digital dictation and possible smart phrase technology. Any transcriptional errors that result from this process are unintentional.

## 2024-07-07 ENCOUNTER — Other Ambulatory Visit (HOSPITAL_COMMUNITY): Payer: Self-pay

## 2024-07-08 ENCOUNTER — Other Ambulatory Visit (HOSPITAL_COMMUNITY): Payer: Self-pay

## 2024-07-08 ENCOUNTER — Telehealth: Payer: Self-pay

## 2024-07-08 NOTE — Telephone Encounter (Signed)
 Pharmacy Patient Advocate Encounter   Received notification from CoverMyMeds that prior authorization for Ubrelvy  100mg  Tablets is required/requested.   Insurance verification completed.   The patient is insured through [facility] .   Per test claim: Refill too soon. PA is not needed at this time. Medication was filled 06/28/2024. Next eligible fill date is 07/21/2024.

## 2024-07-19 ENCOUNTER — Ambulatory Visit: Payer: Self-pay

## 2024-07-19 ENCOUNTER — Telehealth: Admitting: Physician Assistant

## 2024-07-19 DIAGNOSIS — A0811 Acute gastroenteropathy due to Norwalk agent: Secondary | ICD-10-CM

## 2024-07-19 MED ORDER — DICYCLOMINE HCL 10 MG PO CAPS: 10.0000 mg | ORAL_CAPSULE | Freq: Three times a day (TID) | ORAL | 0 refills | Status: DC

## 2024-07-19 MED ORDER — SUCRALFATE 1 G PO TABS: 1.0000 g | ORAL_TABLET | Freq: Three times a day (TID) | ORAL | 0 refills | Status: DC

## 2024-07-19 NOTE — Progress Notes (Signed)
 Virtual Visit Consent   Anna Robinson, you are scheduled for a virtual visit with a [facility] provider today. Just as with appointments in the office, your consent must be obtained to participate. Your consent will be active for this visit and any virtual visit you may have with one of our providers in the next 365 days. If you have a MyChart account, a copy of this consent can be sent to you electronically.  As this is a virtual visit, video technology does not allow for your provider to perform a traditional examination. This may limit your provider's ability to fully assess your condition. If your provider identifies any concerns that need to be evaluated in person or the need to arrange testing (such as labs, EKG, etc.), we will make arrangements to do so. Although advances in technology are sophisticated, we cannot ensure that it will always work on either your end or our end. If the connection with a video visit is poor, the visit may have to be switched to a telephone visit. With either a video or telephone visit, we are not always able to ensure that we have a secure connection.  By engaging in this virtual visit, you consent to the provision of healthcare and authorize for your insurance to be billed (if applicable) for the services provided during this visit. Depending on your insurance coverage, you may receive a charge related to this service.  I need to obtain your verbal consent now. Are you willing to proceed with your visit today? Anna Robinson has provided verbal consent on 07/19/2024 for a virtual visit (video or telephone). [name] [name], [name]  Date: 07/19/2024 12:59 PM   Virtual Visit via Video Note   I, [name] [name], connected with  Anna Robinson  ([id number], 09/12/1971) on 07/19/24 at 12:30 PM EDT by a video-enabled telemedicine application and verified that I am speaking with the correct person using two identifiers.  Location: Patient: Virtual Visit  Location Patient: Home Provider: Virtual Visit Location Provider: Home Office   I discussed the limitations of evaluation and management by telemedicine and the availability of in person appointments. The patient expressed understanding and agreed to proceed.    History of Present Illness: Anna Robinson is a 53 y.o. who identifies as a female who was assigned female at birth, and is being seen today for abdominal cramping and diarrhea.  HPI: Diarrhea  This is a new problem. The current episode started in the past 7 days (3 days). The problem occurs 2 to 4 times per day. The problem has been gradually improving. The stool consistency is described as Watery. The patient states that diarrhea awakens her from sleep. Associated symptoms include abdominal pain (cramping), bloating, chills, headaches, myalgias and vomiting. Pertinent negatives include no fever or sweats. Nothing aggravates the symptoms. There are no known risk factors. Treatments tried: Imodium, zofran , aleve. The treatment provided no relief.     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   Mouth sores 12/01/2022   Lower abdominal pain 12/01/2022   Acute bilateral low back pain without sciatica 12/01/2022   Slow transit constipation 12/01/2022   Anxiety 05/30/2022  Chest pain 02/24/2022   Overweight (BMI 25.0-29.9) 02/24/2022   Gastroesophageal reflux disease without esophagitis 02/24/2022   Sinus arrhythmia seen on electrocardiogram 02/24/2022   Sinus bradycardia 02/24/2022   Class 1 obesity due to excess calories without serious comorbidity with body mass index (BMI) of 31.0 to 31.9  in adult 02/19/2021   Fibrocystic changes of left breast 05/15/2020   Class 1 obesity due to excess calories without serious comorbidity with body mass index (BMI) of 30.0 to 30.9 in adult 02/01/2020   Right ovarian cyst 06/02/2019   Elevated liver enzymes 02/14/2019   Vitamin D  insufficiency 01/26/2019   Elevated fasting glucose 01/26/2019   Dyslipidemia 01/26/2019   Epigastric pain 02/15/2018   Fatigue 01/15/2018   Muscle cramps 03/02/2015    Allergies:  Allergies  Allergen Reactions   Penicillins Other (See Comments) and Rash    Chest tightness   Wellbutrin  [Bupropion ]     nosebleeds   Medications:  Current Outpatient Medications:    dicyclomine  (BENTYL ) 10 MG capsule, Take 1 capsule (10 mg total) by mouth 4 (four) times daily -  before meals and at bedtime for 7 days., Disp: 28 capsule, Rfl: 0   sucralfate  (CARAFATE ) 1 g tablet, Take 1 tablet (1 g total) by mouth 4 (four) times daily -  with meals and at bedtime for 7 days., Disp: 28 tablet, Rfl: 0   botulinum toxin Type A  (BOTOX ) 200 units injection, Injection 155 units into the muscles of the head and neck every 3 months discard remainder, Disp: 1 each, Rfl: 2   Eptinezumab -jjmr (VYEPTI ) 100 MG/ML injection, Inject 1 mL (100 mg total) into the vein every 3 (three) months., Disp: 1 mL, Rfl: 4   fexofenadine  (ALLEGRA  ALLERGY) 180 MG tablet, Take 1 tablet (180 mg total) by mouth daily for 15 days., Disp: 15 tablet, Rfl: 0   linaclotide  (LINZESS ) 290 MCG CAPS capsule, Take 1 capsule (290 mcg total) by mouth daily before breakfast., Disp: 90 capsule, Rfl: 3   ondansetron  (ZOFRAN ) 4 MG tablet, TAKE 1 TABLET BY MOUTH EVERY 8 HOURS AS NEEDED FOR NAUSEA AND VOMITING, Disp: 20 tablet, Rfl: 2   pantoprazole  (PROTONIX ) 40 MG tablet, Take 1 tablet (40 mg total) by mouth daily., Disp: 90 tablet, Rfl: 3   tirzepatide  (ZEPBOUND ) 2.5 MG/0.5ML Pen, Inject 2.5 mg into the skin once a week., Disp: 2 mL, Rfl: 0   tirzepatide  (ZEPBOUND ) 5 MG/0.5ML  Pen, Inject 5 mg into the skin once a week., Disp: 2 mL, Rfl: 1   tirzepatide  (ZEPBOUND ) 7.5 MG/0.5ML Pen, Inject 7.5 mg into the skin once a week., Disp: 2 mL, Rfl: 0   UBRELVY  100 MG TABS, TAKE 1 TABLET BY MOUTH AS NEEDED. REPEAT X1 AFTER 2 HRS IF NEEDED, MAX DOSE 200MG /24HR, Disp: 16 tablet, Rfl: 11  Observations/Objective: Patient is well-developed, well-nourished in no acute distress.  Resting comfortably at home.  Head is normocephalic, atraumatic.  No labored breathing.  Speech is clear and coherent with logical content.  Patient is alert and oriented at baseline.    Assessment and Plan: 1. Norovirus (Primary) - dicyclomine  (BENTYL ) 10 MG capsule; Take 1 capsule (10 mg total) by mouth 4 (four) times daily -  before meals and at bedtime for 7 days.  Dispense: 28 capsule; Refill: 0 - sucralfate  (CARAFATE ) 1 g tablet; Take 1 tablet (1 g total) by mouth 4 (four) times daily -  with meals and at bedtime for 7 days.  Dispense: 28 tablet; Refill: 0  -  Suspect viral gastroenteritis - Add Bentyl  for cramping - Add sucralfate  for inflammation and irritation - Zofran  for nausea as needed with at home medication you already have - Imodium with at home dose as needed - Push fluids, electrolyte beverages - Liquid diet, then increase to soft/bland (BRAT) diet over next day, then increase diet as tolerated - Work note provided - Seek in person evaluation if not improving or symptoms worsen   Follow Up Instructions: I discussed the assessment and treatment plan with the patient. The patient was provided an opportunity to ask questions and all were answered. The patient agreed with the plan and demonstrated an understanding of the instructions.  A copy of instructions were sent to the patient via MyChart unless otherwise noted below.    The patient was advised to call back or seek an in-person evaluation if the symptoms worsen or if the condition fails to improve as anticipated.    [name] [name], [name]

## 2024-07-19 NOTE — Telephone Encounter (Signed)
 FYI Only or Action Required?: FYI only for provider.  Patient was last seen in primary care on 03/30/2024 by Antoniette Vermell CROME, [name].  Called Nurse Triage reporting Vomiting and Diarrhea. Vomiting has resolved. Abdominal pain  Symptoms began several days ago.  Interventions attempted: Other: drinking Pedialyte.  Symptoms are: unchanged.  Triage Disposition: See Physician Within 24 Hours  Patient/caregiver understands and will follow disposition?: Yes                   Copied from CRM #[id number]. Topic: Clinical - Red Word Triage >> Jul 19, 2024 10:41 AM Susanna ORN wrote: Red Word that prompted transfer to Nurse Triage: Patient trying to see if there is availability to see provider today. States she's been vomiting & puking at the same time for 3 days. Now her stomach is cramping really bad and she's still pooping. States she has been drinking pedialyte and it's not helping. Patient states she's never had her stomach to cramp and hurt this long before. Reason for Disposition  [1] SEVERE diarrhea (e.g., 7 or more times / day more than normal) AND [2] present > 24 hours (1 day)  Answer Assessment - Initial Assessment Questions 1. DIARRHEA SEVERITY: How bad is the diarrhea? How many more stools have you had in the past 24 hours than normal?      10-20 2. ONSET: When did the diarrhea begin?      Sunday 3. STOOL DESCRIPTION:  How loose or watery is the diarrhea? What is the stool color? Is there any blood or mucous in the stool?     Water with stuff in it 4. VOMITING: Are you also vomiting? If Yes, ask: How many times in the past 24 hours?      Not now has resolved 5. ABDOMEN PAIN: Are you having any abdomen pain? If Yes, ask: What does it feel like? (e.g., crampy, dull, intermittent, constant)      Cramps 6. ABDOMEN PAIN SEVERITY: If present, ask: How bad is the pain?  (e.g., Scale 1-10; mild, moderate, or severe)     Cramps - constant 7. ORAL INTAKE:  If vomiting, Have you been able to drink liquids? How much liquids have you had in the past 24 hours?     Pedialyte 8. HYDRATION: Any signs of dehydration? (e.g., dry mouth [not just dry lips], too weak to stand, dizziness, new weight loss) When did you last urinate?     No - drinking - mouth is a little dry 9. EXPOSURE: Have you traveled to a foreign country recently? Have you been exposed to anyone with diarrhea? Could you have eaten any food that was spoiled?     no 10. ANTIBIOTIC USE: Are you taking antibiotics now or have you taken antibiotics in the past 2 months?       no  Protocols used: Diarrhea-A-AH

## 2024-07-19 NOTE — Patient Instructions (Signed)
 Anna Robinson, thank you for joining Anna CHRISTELLA [name], [name] for today's virtual visit.  While this provider is not your primary care provider (PCP), if your PCP is located in our provider database this encounter information will be shared with them immediately following your visit.   A [facility] MyChart account gives you access to today's visit and all your visits, tests, and labs performed at [facility]  click here if you don't have a [facility] MyChart account or go to [email or web address]  Consent: (Patient) Anna Robinson provided verbal consent for this virtual visit at the beginning of the encounter.  Current Medications:  Current Outpatient Medications:    dicyclomine  (BENTYL ) 10 MG capsule, Take 1 capsule (10 mg total) by mouth 4 (four) times daily -  before meals and at bedtime for 7 days., Disp: 28 capsule, Rfl: 0   sucralfate  (CARAFATE ) 1 g tablet, Take 1 tablet (1 g total) by mouth 4 (four) times daily -  with meals and at bedtime for 7 days., Disp: 28 tablet, Rfl: 0   botulinum toxin Type A  (BOTOX ) 200 units injection, Injection 155 units into the muscles of the head and neck every 3 months discard remainder, Disp: 1 each, Rfl: 2   Eptinezumab -jjmr (VYEPTI ) 100 MG/ML injection, Inject 1 mL (100 mg total) into the vein every 3 (three) months., Disp: 1 mL, Rfl: 4   fexofenadine  (ALLEGRA  ALLERGY) 180 MG tablet, Take 1 tablet (180 mg total) by mouth daily for 15 days., Disp: 15 tablet, Rfl: 0   linaclotide  (LINZESS ) 290 MCG CAPS capsule, Take 1 capsule (290 mcg total) by mouth daily before breakfast., Disp: 90 capsule, Rfl: 3   ondansetron  (ZOFRAN ) 4 MG tablet, TAKE 1 TABLET BY MOUTH EVERY 8 HOURS AS NEEDED FOR NAUSEA AND VOMITING, Disp: 20 tablet, Rfl: 2   pantoprazole  (PROTONIX ) 40 MG tablet, Take 1 tablet (40 mg total) by mouth daily., Disp: 90 tablet, Rfl: 3   tirzepatide  (ZEPBOUND ) 2.5 MG/0.5ML Pen, Inject 2.5 mg into the skin once a week., Disp: 2  mL, Rfl: 0   tirzepatide  (ZEPBOUND ) 5 MG/0.5ML Pen, Inject 5 mg into the skin once a week., Disp: 2 mL, Rfl: 1   tirzepatide  (ZEPBOUND ) 7.5 MG/0.5ML Pen, Inject 7.5 mg into the skin once a week., Disp: 2 mL, Rfl: 0   UBRELVY  100 MG TABS, TAKE 1 TABLET BY MOUTH AS NEEDED. REPEAT X1 AFTER 2 HRS IF NEEDED, MAX DOSE 200MG /24HR, Disp: 16 tablet, Rfl: 11   Medications ordered in this encounter:  Meds ordered this encounter  Medications   dicyclomine  (BENTYL ) 10 MG capsule    Sig: Take 1 capsule (10 mg total) by mouth 4 (four) times daily -  before meals and at bedtime for 7 days.    Dispense:  28 capsule    Refill:  0    Supervising Provider:   LAMPTEY, PHILIP O [[id number]]   sucralfate  (CARAFATE ) 1 g tablet    Sig: Take 1 tablet (1 g total) by mouth 4 (four) times daily -  with meals and at bedtime for 7 days.    Dispense:  28 tablet    Refill:  0    Supervising Provider:   BLAISE ALEENE KIDD [[id number]]     *If you need refills on other medications prior to your next appointment, please contact your pharmacy*  Follow-Up: Call back or seek an in-person evaluation if the symptoms worsen or if the condition fails to improve as anticipated.  [facility]  [facility] [phone]  Other Instructions  Norovirus Infection Norovirus infection causes inflammation in the stomach and intestines (gastroenteritis) and food poisoning. It is caused by exposure to a virus from a group of similar viruses called noroviruses. Norovirus spreads very easily from person to person (is very contagious). It often occurs in places where people are in close contact, such as schools, nursing homes, restaurants, and cruise ships. You can get it from food, water, surfaces, or other people who have the virus. Norovirus is also found in the stool (feces) or vomit of infected people. You can spread the infection as soon as you feel sick, and you may continue to be contagious after you recover. What are the causes? This  condition is caused by contact with norovirus. You can catch norovirus if you: Eat or drink something that is contaminated with norovirus. Touch surfaces or objects that are contaminated with norovirus and then put your hand in or by your mouth or nose. Have direct contact with an infected person who may or may not still have symptoms. Share food, drink, or utensils with someone who is contagious with norovirus. What are the signs or symptoms? Symptoms usually begin within 12 hours to 2 days after you become infected. Most norovirus symptoms affect the digestive system.Symptoms may include: Nausea, vomiting, and diarrhea. Stomach cramps. Fever. Chills. Headache. Muscle aches and tiredness. How is this diagnosed? This condition may be diagnosed based on: Your symptoms. A physical exam. A stool test. How is this treated? There is no specific treatment for norovirus. Most people get better without treatment in about 2 days. Young children, the elderly, and people who are already sick may take up to 6 days to recover. Follow these instructions at home:  Eating and drinking  Drink plenty of water to replace fluids that are lost through diarrhea and vomiting. This prevents dehydration. Drink enough fluid to keep your urine pale yellow. Drink clear fluids in small amounts as you are able. Clear fluids include water, ice chips, fruit juice with water added (diluted fruit juice), and low-calorie sports drinks. Avoid fluids that contain a lot of sugar or caffeine, such as energy drinks, sports drinks, and soda. Avoid alcohol. If instructed by your health care provider, drink an oral rehydration solution (ORS). This is a drink that is sold at pharmacies and retail stores. An ORS contains minerals (electrolytes) that you can lose through diarrhea and vomiting. Eat bland, easy-to-digest foods in small amounts as you are able. These foods include rice, lean meats, toast, and crackers. Avoid spicy or  fatty foods. General instructions Rest at home while you recover. Do not prepare food for others while you are infected. Wait at least 3 days after you recover from the illness to do this. Take over-the-counter and prescription medicines only as told by your health care provider. Wash your hands frequently with soap and water for at least 20 seconds. Alcohol-based hand sanitizer can be used in addition to soap and water, but sanitizer should not be the only cleansing method because it is not effective at removing norovirus from your hands or surfaces. Make sure that each person in your household washes his or her hands well and often. Keep all follow-up visits. This is important. How is this prevented? To help prevent the spread of norovirus: Stay at home if you are feeling sick. This will reduce the risk of spreading the virus to others. Wash your hands often with soap and water for at least 20 seconds, especially  after using the toilet, helping a child use the toilet, or changing a child's diaper. Wash fruits and vegetables thoroughly before peeling, preparing, or serving them. Throw out any food that a sick person may have touched. Disinfect contaminated surfaces immediately after someone in the household has been sick. Disinfect frequently used surfaces, such as counters, doorknobs, and faucets. Use a bleach-based household cleaner. Immediately remove and wash soiled clothes or sheets. Contact a health care provider if: You have vomiting, diarrhea, or stomach pain that gets worse. You have symptoms that do not go away after 3-6 days. You have a fever. You cannot drink without vomiting. You feel light-headed or dizzy. Your symptoms get worse. Get help right away if: You develop symptoms of dehydration that do not improve with fluid replacement, such as: Excessive sleepiness. Lack of tears. Very little urine production. Dry mouth. Muscle cramps. Weak  pulse. Confusion. Summary Norovirus infection is common and often occurs in places where people are in close contact, such as schools, nursing homes, restaurants, and cruise ships. To help prevent the spread of this infection, wash hands with soap and water for at least 20 seconds before handling food or after having contact with stool or body fluids. There is no specific treatment for norovirus, but most people get better without treatment in about 2 days. People who are healthy when infected often recover sooner than those who are elderly, young, or already sick. Replace lost fluids by drinking plenty of water, or by drinking oral rehydration solution (ORS), which contains important minerals called electrolytes. This prevents dehydration. This information is not intended to replace advice given to you by your health care provider. Make sure you discuss any questions you have with your health care provider. Document Revised: 07/10/2021 Document Reviewed: 07/10/2021 Elsevier Patient Education  2024 Elsevier Inc.   If you have been instructed to have an in-person evaluation today at a local Urgent Care facility, please use the link below. It will take you to a list of all of our available [facility], including address, phone number and hours of operation. Please do not delay care.  [facility]  If you or a family member do not have a primary care provider, use the link below to schedule a visit and establish care. When you choose a [facility] primary care physician or advanced practice provider, you gain a long-term partner in health. Find a Primary Care Provider  Learn more about [facility]'s in-office and virtual care options: [facility] - Get Care Now

## 2024-07-19 NOTE — Telephone Encounter (Signed)
 Appointment today

## 2024-07-26 ENCOUNTER — Ambulatory Visit (INDEPENDENT_AMBULATORY_CARE_PROVIDER_SITE_OTHER): Admitting: Physician Assistant

## 2024-07-26 ENCOUNTER — Encounter: Payer: Self-pay | Admitting: Physician Assistant

## 2024-07-26 VITALS — BP 122/78 | HR 70 | Ht 65.0 in | Wt 165.0 lb

## 2024-07-26 DIAGNOSIS — E663 Overweight: Secondary | ICD-10-CM | POA: Diagnosis not present

## 2024-07-26 DIAGNOSIS — Z8639 Personal history of other endocrine, nutritional and metabolic disease: Secondary | ICD-10-CM | POA: Diagnosis not present

## 2024-07-26 MED ORDER — ZEPBOUND 7.5 MG/0.5ML ~~LOC~~ SOAJ: 7.5000 mg | SUBCUTANEOUS | 1 refills | Status: AC

## 2024-07-26 NOTE — Progress Notes (Signed)
   Established Patient Office Visit  Subjective   Patient ID: Anna Robinson, female    DOB: July 16, 1971  Age: 53 y.o. MRN: [id number]  No chief complaint on file.   HPI Pt is a 53 yo female who presents to the clinic to follow up on weight loss. She is on zepbound  7.5mg  weekly. She is tolerating well but does not want to go up. She is eating less and able to move more. She has lost 25lbs in last 3 months. She has been able to manage the constipation. She denies any significant nausea or vomiting.    ROS See HPI.    Objective:     Ht 5' 5 (1.651 m)   Wt 165 lb (74.8 kg)   SpO2 99%   BMI 27.46 kg/m  BP Readings from Last 3 Encounters:  07/04/24 112/72  03/30/24 (!) 108/55  03/09/24 126/83   Wt Readings from Last 3 Encounters:  07/26/24 165 lb (74.8 kg)  03/30/24 190 lb (86.2 kg)  07/20/23 187 lb 3.2 oz (84.9 kg)      Physical Exam Constitutional:      Appearance: Normal appearance.  HENT:     Head: Normocephalic.  Cardiovascular:     Rate and Rhythm: Normal rate and regular rhythm.  Pulmonary:     Effort: Pulmonary effort is normal.  Neurological:     General: No focal deficit present.     Mental Status: She is alert and oriented to person, place, and time.  Psychiatric:        Mood and Affect: Mood normal.        The 10-year ASCVD risk score (Arnett DK, et al., 2019) is: 2.4%    Assessment & Plan:  SABRASABRADiagnoses and all orders for this visit:  Overweight (BMI 25.0-29.9) -     tirzepatide  (ZEPBOUND ) 7.5 MG/0.5ML Pen; Inject 7.5 mg into the skin once a week.  History of obesity -     tirzepatide  (ZEPBOUND ) 7.5 MG/0.5ML Pen; Inject 7.5 mg into the skin once a week.   Down 25lbs  Continue on zepbound  7.5mg  weekly Continue with healthy eating and regular exercise Follow up in 6 months Goal BMI is under 25   Bryar Rennie, [name]

## 2024-09-07 ENCOUNTER — Telehealth: Payer: Self-pay | Admitting: Adult Health

## 2024-09-07 NOTE — Telephone Encounter (Signed)
 Submitted auth request via CMM, status is pending. Key: [id number]

## 2024-09-09 ENCOUNTER — Telehealth: Payer: Self-pay

## 2024-09-09 NOTE — Telephone Encounter (Signed)
 Pharmacy Patient Advocate Encounter  Received notification from [facility] that Prior Authorization for ZEPBOUND  has been APPROVED from 09/09/24 to 09/09/25   PA #/Case ID/Reference #: [id number]  *RENEWAL*

## 2024-09-09 NOTE — Telephone Encounter (Signed)
 Pharmacy Patient Advocate Encounter   Received notification from CoverMyMeds that prior authorization for ZEPBOUND  is due for renewal.   Insurance verification completed.   The patient is insured through [facility].  Action: PA required; PA submitted to above mentioned insurance via Latent Key/confirmation #/EOC [id number] Status is pending

## 2024-09-10 ENCOUNTER — Ambulatory Visit
Admission: EM | Admit: 2024-09-10 | Discharge: 2024-09-10 | Disposition: A | Discharge status: Home / Self Care | Attending: Family Medicine | Admitting: Family Medicine

## 2024-09-10 DIAGNOSIS — M47896 Other spondylosis, lumbar region: Secondary | ICD-10-CM

## 2024-09-10 DIAGNOSIS — M545 Low back pain, unspecified: Secondary | ICD-10-CM

## 2024-09-10 MED ORDER — TRAMADOL HCL 50 MG PO TABS: 50.0000 mg | ORAL_TABLET | Freq: Four times a day (QID) | ORAL | 0 refills | Status: DC | PRN

## 2024-09-10 MED ORDER — TIZANIDINE HCL 4 MG PO TABS: 4.0000 mg | ORAL_TABLET | Freq: Four times a day (QID) | ORAL | 0 refills | Status: DC | PRN

## 2024-09-10 MED ORDER — METHYLPREDNISOLONE 4 MG PO TBPK: ORAL_TABLET | ORAL | 0 refills | Status: DC

## 2024-09-10 NOTE — ED Triage Notes (Signed)
 Pt states that she lower, middle back pain. X3 days Pt denies any known injury. Pt states she has used patches on her back otc.  Pt states that she has also taken muscle relaxers which isn't helping.

## 2024-09-10 NOTE — Discharge Instructions (Signed)
 Take the steroid Dosepak as directed.  Take all of day 1 today as a single dose as soon as you get the prescription filled Instead of Flexeril , I have prescribed tizanidine to try as a muscle relaxer Get plenty of rest.  May use ice or heat to the area I have prescribed tramadol  if needed for pain.  Do not drive on tramadol  See your doctor if not improving by next week

## 2024-09-12 NOTE — Telephone Encounter (Signed)
 Auth was denied, not covered under pharmacy benefit. I resubmitted under medical benefit and received approval, please send rx to [facility].  Auth#: [id number] (09/12/24-09/12/25)

## 2024-09-23 ENCOUNTER — Other Ambulatory Visit: Payer: Self-pay | Admitting: Physician Assistant

## 2024-09-23 DIAGNOSIS — R110 Nausea: Secondary | ICD-10-CM

## 2024-09-26 MED ORDER — ONABOTULINUMTOXINA 200 UNITS IJ SOLR: INTRAMUSCULAR | 2 refills | Status: AC

## 2024-09-26 NOTE — Addendum Note (Signed)
 Addended by: NEYSA NENA RAMAN on: 09/26/2024 11:38 AM   Modules accepted: Orders

## 2024-09-26 NOTE — Telephone Encounter (Signed)
 Please send her Botox  rx to [facility].

## 2024-09-27 NOTE — Telephone Encounter (Signed)
 Received confirmation from [facility] they got the prescription.

## 2024-10-04 ENCOUNTER — Ambulatory Visit (INDEPENDENT_AMBULATORY_CARE_PROVIDER_SITE_OTHER): Admitting: Adult Health

## 2024-10-04 ENCOUNTER — Telehealth: Payer: Self-pay | Admitting: *Deleted

## 2024-10-04 VITALS — BP 121/65 | HR 56

## 2024-10-04 DIAGNOSIS — G43709 Chronic migraine without aura, not intractable, without status migrainosus: Secondary | ICD-10-CM | POA: Diagnosis not present

## 2024-10-04 MED ORDER — ONABOTULINUMTOXINA 200 UNITS IJ SOLR
155.0000 [IU] | Freq: Once | INTRAMUSCULAR | Status: AC
  Administered 2024-10-04: 155 [IU] via INTRAMUSCULAR

## 2024-10-04 NOTE — Telephone Encounter (Signed)
 Signed and placed in outbox.  Thank you. ?

## 2024-10-04 NOTE — Progress Notes (Signed)
 Botox - 200 units x 1 vial Lot: [id number]            Expiration: 11/2026 NDC: 9976-6078-97   Bacteriostatic 0.9% Sodium Chloride - 4 mL  Lot: [id number] Expiration: 10/14/2025 NDC: 9590-8033-97   Dx: H56.290  SP Witnessed by Rojean DEL

## 2024-10-04 NOTE — Telephone Encounter (Signed)
-----   Message from [place] sent at 10/04/2024  1:17 PM EDT ----- Please complete form to increase Vyepti  to 300mg  every 3 months. Thank you.

## 2024-10-04 NOTE — Telephone Encounter (Signed)
 Order given to [facility].

## 2024-10-04 NOTE — Telephone Encounter (Signed)
 Order for Vyepti  300mg  IV every 3 months for 1 year.

## 2024-10-04 NOTE — Progress Notes (Signed)
 Update 10/04/2024 JM: Returns for repeat Botox  injection.  Prior injection 07/04/2024.  Reports continued benefit with Botox  with >50% migraine reduction and further improvement on Vyepti  100mg  infusion every 3 months. She did not experience any migraines until 1 week ago, has had 3 migraines since then which can be debilitating. Use of Ubrelvy  typically with benefit. She questions increasing Vyepti  infusion. Tolerated procedure well today.  Return in 3 months for repeat injection.        Consent Form Botulism Toxin Injection For Chronic Migraine    Reviewed orally with patient, additionally signature is on file:  Botulism toxin has been approved by the Federal drug administration for treatment of chronic migraine. Botulism toxin does not cure chronic migraine and it may not be effective in some patients.  The administration of botulism toxin is accomplished by injecting a small amount of toxin into the muscles of the neck and head. Dosage must be titrated for each individual. Any benefits resulting from botulism toxin tend to wear off after 3 months with a repeat injection required if benefit is to be maintained. Injections are usually done every 3-4 months with maximum effect peak achieved by about 2 or 3 weeks. Botulism toxin is expensive and you should be sure of what costs you will incur resulting from the injection.  The side effects of botulism toxin use for chronic migraine may include:   -Transient, and usually mild, facial weakness with facial injections  -Transient, and usually mild, head or neck weakness with head/neck injections  -Reduction or loss of forehead facial animation due to forehead muscle weakness  -Eyelid drooping  -Dry eye  -Pain at the site of injection or bruising at the site of injection  -Double vision  -Potential unknown long term risks   Contraindications: You should not have Botox  if you are pregnant, nursing, allergic to albumin, have an  infection, skin condition, or muscle weakness at the site of the injection, or have myasthenia gravis, Lambert-Eaton syndrome, or ALS.  It is also possible that as with any injection, there may be an allergic reaction or no effect from the medication. Reduced effectiveness after repeated injections is sometimes seen and rarely infection at the injection site may occur. All care will be taken to prevent these side effects. If therapy is given over a long time, atrophy and wasting in the muscle injected may occur. Occasionally the patient's become refractory to treatment because they develop antibodies to the toxin. In this event, therapy needs to be modified.  I have read the above information and consent to the administration of botulism toxin.    BOTOX  PROCEDURE NOTE FOR MIGRAINE HEADACHE  Contraindications and precautions discussed with patient(above). Aseptic procedure was observed and patient tolerated procedure. Procedure performed by Harlene Bogaert, AGNP-BC.   The condition has existed for more than 6 months, and pt does not have a diagnosis of ALS, Myasthenia Gravis or Lambert-Eaton Syndrome.  Risks and benefits of injections discussed and pt agrees to proceed with the procedure.  Written consent obtained  These injections are medically necessary. Pt  receives good benefits from these injections. These injections do not cause sedations or hallucinations which the oral therapies may cause.   Description of procedure:  The patient was placed in a sitting position. The standard protocol was used for Botox  as follows, with 5 units of Botox  injected at each site:  -Procerus muscle, midline injection  -Corrugator muscle, bilateral injection  -Frontalis muscle, bilateral injection, with 2 sites  each side, medial injection was performed in the upper one third of the frontalis muscle, in the region vertical from the medial inferior edge of the superior orbital rim. The lateral injection was  again in the upper one third of the forehead vertically above the lateral limbus of the cornea, 1.5 cm lateral to the medial injection site.  -Temporalis muscle injection, 4 sites, bilaterally. The first injection was 3 cm above the tragus of the ear, second injection site was 1.5 cm to 3 cm up from the first injection site in line with the tragus of the ear. The third injection site was 1.5-3 cm forward between the first 2 injection sites. The fourth injection site was 1.5 cm posterior to the second injection site. 5th site laterally in the temporalis  muscleat the level of the outer canthus.  -Occipitalis muscle injection, 3 sites, bilaterally. The first injection was done one half way between the occipital protuberance and the tip of the mastoid process behind the ear. The second injection site was done lateral and superior to the first, 1 fingerbreadth from the first injection. The third injection site was 1 fingerbreadth superiorly and medially from the first injection site.  -Cervical paraspinal muscle injection, 2 sites, bilaterally. The first injection site was 1 cm from the midline of the cervical spine, 3 cm inferior to the lower border of the occipital protuberance. The second injection site was 1.5 cm superiorly and laterally to the first injection site.  -Trapezius muscle injection was performed at 3 sites, bilaterally. The first injection site was in the upper trapezius muscle halfway between the inflection point of the neck, and the acromion. The second injection site was one half way between the acromion and the first injection site. The third injection was done between the first injection site and the inflection point of the neck.    A total of 200 units of Botox  was prepared, 155 units of Botox  was injected as documented above, any Botox  not injected was wasted. The patient tolerated the procedure well, there were no complications of the above procedure.   Harlene Bogaert,  AGNP-BC  [facility] [address] [place], [place] [address]  Phone [phone] Fax [phone] Note: This document was prepared with digital dictation and possible smart phrase technology. Any transcriptional errors that result from this process are unintentional.

## 2024-10-30 ENCOUNTER — Encounter: Payer: Self-pay | Admitting: Physician Assistant

## 2024-10-31 ENCOUNTER — Ambulatory Visit: Admitting: Family Medicine

## 2024-10-31 ENCOUNTER — Ambulatory Visit

## 2024-10-31 ENCOUNTER — Encounter: Payer: Self-pay | Admitting: Family Medicine

## 2024-10-31 VITALS — BP 122/68 | HR 64 | Ht 66.0 in | Wt 158.1 lb

## 2024-10-31 DIAGNOSIS — J019 Acute sinusitis, unspecified: Secondary | ICD-10-CM | POA: Diagnosis not present

## 2024-10-31 DIAGNOSIS — Z1231 Encounter for screening mammogram for malignant neoplasm of breast: Secondary | ICD-10-CM

## 2024-10-31 DIAGNOSIS — R2242 Localized swelling, mass and lump, left lower limb: Secondary | ICD-10-CM

## 2024-10-31 DIAGNOSIS — J029 Acute pharyngitis, unspecified: Secondary | ICD-10-CM | POA: Diagnosis not present

## 2024-10-31 LAB — POCT RAPID STREP A (OFFICE): Rapid Strep A Screen: NEGATIVE

## 2024-10-31 MED ORDER — AZITHROMYCIN 250 MG PO TABS: ORAL_TABLET | ORAL | 0 refills | Status: AC

## 2024-10-31 NOTE — Progress Notes (Signed)
 Acute Office Visit  Patient ID: [facility], female    DOB: [birth year], 53 y.o.   MRN: [id number]  PCP: Antoniette Vermell CROME, [name]  Chief Complaint  Patient presents with   spot on leg   scratchy throat    Subjective:     HPI  Discussed the use of AI scribe software for clinical note transcription with the patient, who gave verbal consent to proceed.  History of Present Illness Anna Robinson is a 53 year old female who presents with a painful lump on her leg and associated symptoms.  Painful leg mass - Painful lump developed on left posterior leg after a bike ride one week ago - Initial size approximately that of a golf ball, very sore - No recollection of specific injury or insect bite - Next day, lump appeared black and was less painful - Over the past week, lump has decreased in size but increased in tenderness - Green discoloration noted around the edges of the lump  Headache - Headache present since the same Sunday as onset of leg lump  Pharyngitis symptoms - Sore, itchy throat present since Sunday - Throat discomfort described as mild irritation, not severe pain - No significant nasal congestion or chest symptoms - Works part-time at [employer], where many coworkers have been ill, raising concern for possible infection  Migraine history - History of migraines - Receives Botox  treatments for migraines, which have improved her condition over the years  Right-sided pain and musculoskeletal tightness - Experiences right-sided pain and tightness - Right-handed   ROS     Objective:    BP 122/68   Pulse 64   Ht 5' 6 (1.676 m)   Wt 158 lb 1.9 oz (71.7 kg)   SpO2 98%   BMI 25.52 kg/m    Physical Exam Constitutional:      Appearance: Normal appearance.  HENT:     Head: Normocephalic and atraumatic.     Right Ear: Tympanic membrane, ear canal and external ear normal. There is no impacted cerumen.     Left Ear: Tympanic membrane, ear canal and  external ear normal. There is no impacted cerumen.     Nose: Nose normal.     Mouth/Throat:     Pharynx: Oropharynx is clear. Posterior oropharyngeal erythema present.  Eyes:     Conjunctiva/sclera: Conjunctivae normal.  Cardiovascular:     Rate and Rhythm: Normal rate and regular rhythm.  Pulmonary:     Effort: Pulmonary effort is normal.     Breath sounds: Normal breath sounds.  Musculoskeletal:     Cervical back: Neck supple. No tenderness.     Comments: On left post thigh very larger bruise with a lump in the center about 3 cm in size, tender.    Lymphadenopathy:     Cervical: No cervical adenopathy.  Skin:    General: Skin is warm and dry.  Neurological:     Mental Status: She is alert and oriented to person, place, and time.  Psychiatric:        Mood and Affect: Mood normal.       Results for orders placed or performed in visit on 10/31/24  POCT rapid strep A  Result Value Ref Range   Rapid Strep A Screen Negative Negative       Assessment & Plan:   Problem List Items Addressed This Visit   None Visit Diagnoses       Subcutaneous nodule of left lower leg    -  Primary   Relevant Orders   US  LT LOWER EXTREM LTD SOFT TISSUE NON VASCULAR     Sore throat       Relevant Orders   POCT rapid strep A (Completed)     Pharyngitis, unspecified etiology         Screening mammogram for breast cancer       Relevant Orders   MM 3D SCREENING MAMMOGRAM BILATERAL BREAST     Acute non-recurrent sinusitis, unspecified location       Relevant Medications   azithromycin  (ZITHROMAX ) 250 MG tablet       Assessment and Plan Assessment & Plan Painful lump with bruising on posterior thigh, etiology unclear Presents with a painful lump on the posterior thigh, initially the size of a golf ball, now smaller but more tender. Developed after a bike ride a week ago, with extensive bruising and green discoloration. Differential diagnosis includes a swollen lymph node, cyst, or pocket  of fluid. Unlikely to be a bug bite or blood clot due to the nature of the bruising and lack of trauma. - Ordered ultrasound of the posterior thigh to evaluate the lump and surrounding structures.  Headache and throat irritation, cough and post nasal draininage. possible upper respiratory infection x 1 week, feels she is getting worse.  Reports headache and throat irritation for a week, with mild congestion and itchy throat. Symptoms suggest a possible upper respiratory infection, potentially sinusitis or a viral infection. Works in an environment with many sick individuals, increasing exposure risk. - Swabbed for strep throat to rule out bacterial infection. Strep negative.  - will treat with zpack since feels she is getting worse.    Chronic right-sided back pain, likely musculoskeletal Experiences chronic right-sided back pain, possibly musculoskeletal in origin. Pain may be related to muscle tightness or a pinched nerve, as she is right-handed and reports tightness on the right side. - Continue stretching exercises to alleviate muscle tightness.    Meds ordered this encounter  Medications   azithromycin  (ZITHROMAX ) 250 MG tablet    Sig: 2 Ttabs PO on Day 1, then one a day x 4 days.    Dispense:  6 tablet    Refill:  0    Return if symptoms worsen or fail to improve.  Dorothyann Byars, MD [facility] at [facility]

## 2024-11-08 ENCOUNTER — Ambulatory Visit: Payer: Self-pay | Admitting: Family Medicine

## 2024-11-08 NOTE — Progress Notes (Signed)
 Hi Anna Robinson, we got your ultrasound results back.  They said just see what is called a simple cyst in that area on your posterior left thigh.  They did not see any type of mass which is very reassuring.  At this point I would just keep an eye on it if it is changing or bothering you then please let me know the other option would be to have it removed and we could always refer you to a general surgeon if you would like to do that.  But it did not have any worrisome features so I think if you choose to have it removed then it is not urgent.

## 2024-11-29 ENCOUNTER — Encounter: Payer: Self-pay | Admitting: Family Medicine

## 2024-11-29 ENCOUNTER — Ambulatory Visit: Admitting: Family Medicine

## 2024-11-29 VITALS — BP 102/64 | HR 60 | Ht 66.0 in | Wt 165.0 lb

## 2024-11-29 DIAGNOSIS — R42 Dizziness and giddiness: Secondary | ICD-10-CM

## 2024-11-29 DIAGNOSIS — H8110 Benign paroxysmal vertigo, unspecified ear: Secondary | ICD-10-CM | POA: Insufficient documentation

## 2024-11-29 DIAGNOSIS — H8111 Benign paroxysmal vertigo, right ear: Secondary | ICD-10-CM

## 2024-11-29 MED ORDER — MECLIZINE HCL 25 MG PO TABS: 25.0000 mg | ORAL_TABLET | Freq: Three times a day (TID) | ORAL | 0 refills | Status: AC | PRN

## 2024-11-29 MED ORDER — NAPROXEN 500 MG PO TABS: 500.0000 mg | ORAL_TABLET | Freq: Two times a day (BID) | ORAL | 1 refills | Status: DC | PRN

## 2024-11-29 MED ORDER — PREDNISONE 20 MG PO TABS: 20.0000 mg | ORAL_TABLET | Freq: Two times a day (BID) | ORAL | 0 refills | Status: AC

## 2024-11-29 MED ORDER — METHYLPREDNISOLONE ACETATE 40 MG/ML IJ SUSP
40.0000 mg | Freq: Once | INTRAMUSCULAR | Status: AC
  Administered 2024-11-29: 16:00:00 40 mg via INTRAMUSCULAR

## 2024-11-29 NOTE — Patient Instructions (Signed)
 Vertigo Vertigo is the feeling that you or the things around you are moving or spinning when they're not. It's different than feeling dizzy. It can also cause: Loss of balance. Trouble standing or walking. Nausea and vomiting. This feeling can come and go at any time. It can last from a few seconds to minutes or even hours. It may go away on its own or be treated with medicine. What are the types of vertigo? There are two types of vertigo: Peripheral vertigo happens when parts of your inner ear don't work like they should. This is the more common type. Central vertigo happens when your brain and spinal cord don't work like they should. Your health care provider will do tests to find out what kind of vertigo you have. This will help them decide on the right treatment for you. Follow these instructions at home: Eating and drinking Drink enough fluid to keep your pee (urine) pale yellow. Do not drink alcohol. Activity When you get up in the morning, first sit up on the side of the bed. When you feel okay, stand slowly while holding onto something. Move slowly. Avoid sudden body or head movements. Avoid certain positions, as told by your provider. Use a cane if you have trouble standing or walking. Sit down right away if you feel unsteady. Place items in your home so they're easy for you to reach without bending or leaning over. Return to normal activities when you're told. Ask what things are safe for you to do. General instructions Take your medicines only as told by your provider. Contact a health care provider if: Your medicines don't help or make your vertigo worse. You get new symptoms. You have a fever. You have nausea or vomiting. Your family or friends spot any changes in how you're acting. A part of your body goes numb. You feel tingling and prickling in a part of your body. You get very bad headaches. Get help right away if: You're always dizzy or you faint. You have a  stiff neck. You have trouble moving or speaking. Your hands, arms, or legs feel weak. Your hearing or eyesight changes. These symptoms may be an emergency. Call 911 right away. Do not wait to see if the symptoms will go away. Do not drive yourself to the hospital. This information is not intended to replace advice given to you by your health care provider. Make sure you discuss any questions you have with your health care provider. Document Revised: 09/03/2023 Document Reviewed: 03/06/2023 Elsevier Patient Education  2024 [employer].

## 2024-11-29 NOTE — Assessment & Plan Note (Signed)
 Adding meclizine .  Depo-medrol  injection given in office with 5 day burst of prednisone  to follow.  Referral to vestibular therapy if not improving.

## 2024-11-29 NOTE — Progress Notes (Signed)
 Anna Robinson - 53 y.o. female MRN [id number]  Date of birth: 03/01/1971  Subjective Chief Complaint  Patient presents with   Dizziness    HPI Anna Robinson is a 53 y.o. female here today with complaint of dizziness.  Symptoms started last night.  Describes as sensation of room spinning.  She did have a cold last week.  She feels fullness in her R ear.  Symptoms worse with sitting up or turning her head.  Denies fever or chills.  Mild nausea.  Using zofran  for this.   ROS:  A comprehensive ROS was completed and negative except as noted per HPI  Allergies[1]  Past Medical History:  Diagnosis Date   Allergy    Anxiety    Constipation    GERD (gastroesophageal reflux disease)    IBS (irritable bowel syndrome)    PONV (postoperative nausea and vomiting)    Seasonal allergies     Past Surgical History:  Procedure Laterality Date   COLONOSCOPY     ~10 yrs- normal per pt    TOTAL ABDOMINAL HYSTERECTOMY     WISDOM TOOTH EXTRACTION     age 50-17    Social History   Socioeconomic History   Marital status: Married    Spouse name: Not on file   Number of children: 2   Years of education: Not on file   Highest education level: Not on file  Occupational History   Not on file  Tobacco Use   Smoking status: Never   Smokeless tobacco: Never  Vaping Use   Vaping status: Never Used  Substance and Sexual Activity   Alcohol use: Not Currently    Comment: rare - not often per pt    Drug use: No   Sexual activity: Yes    Partners: Female  Other Topics Concern   Not on file  Social History Narrative   Right handed   Reader glasses    Rare coffee use   Social Drivers of Health   Tobacco Use: Low Risk (11/29/2024)   Patient History    Smoking Tobacco Use: Never    Smokeless Tobacco Use: Never    Passive Exposure: Not on file  Financial Resource Strain: Not on file  Food Insecurity: Not on file  Transportation Needs: Not on file  Physical Activity: Not on file   Stress: Not on file  Social Connections: Unknown (04/25/2022)   Received from [facility]   Social Network    Social Network: Not on file  Depression (PHQ2-9): Low Risk (10/31/2024)   Depression (PHQ2-9)    PHQ-2 Score: 0  Alcohol Screen: Not on file  Housing: Not on file  Utilities: Not on file  Health Literacy: Not on file    Family History  Problem Relation Age of Onset   Heart disease Mother    Diabetes Mother    Hyperlipidemia Mother    Hypertension Mother    Stroke Mother    Breast cancer Mother    Cancer Father    Brain cancer Father    Lung cancer Father    Anuerysm Father    Depression Maternal Aunt    Heart disease Maternal Grandmother    Esophageal cancer Sister    Colon cancer Neg Hx    Colon polyps Neg Hx    Rectal cancer Neg Hx    Stomach cancer Neg Hx     Health Maintenance  Topic Date Due   Hepatitis B Vaccines 19-59 Average Risk (1 of 3 -  19+ 3-dose series) Never done   Pneumococcal Vaccine: 50+ Years (1 of 1 - PCV) Never done   Mammogram  05/05/2024   Influenza Vaccine  03/14/2025 (Originally 07/15/2024)   DTaP/Tdap/Td (2 - Td or Tdap) 02/27/2025   Colonoscopy  02/16/2030   Hepatitis C Screening  Completed   HIV Screening  Completed   Zoster Vaccines- Shingrix   Completed   HPV VACCINES  Aged Out   Meningococcal B Vaccine  Aged Out   COVID-19 Vaccine  Discontinued     ----------------------------------------------------------------------------------------------------------------------------------------------------------------------------------------------------------------- Physical Exam BP 102/64 (BP Location: Left Arm, Patient Position: Sitting, Cuff Size: Normal)   Pulse 60   Ht 5' 6 (1.676 m)   Wt 165 lb (74.8 kg)   SpO2 99%   BMI 26.63 kg/m   Physical Exam Constitutional:      Appearance: Normal appearance.  HENT:     Head: Normocephalic and atraumatic.     Ears:     Comments: Serous fluid R TM.  Eyes:     General: No  scleral icterus. Cardiovascular:     Rate and Rhythm: Normal rate and regular rhythm.  Skin:    Comments: +dix hallpike to R.   Neurological:     Mental Status: She is alert.  Psychiatric:        Mood and Affect: Mood normal.        Behavior: Behavior normal.     ------------------------------------------------------------------------------------------------------------------------------------------------------------------------------------------------------------------- Assessment and Plan  BPPV (benign paroxysmal positional vertigo) Adding meclizine .  Depo-medrol  injection given in office with 5 day burst of prednisone  to follow.  Referral to vestibular therapy if not improving.    Meds ordered this encounter  Medications   DISCONTD: naproxen  (NAPROSYN ) 500 MG tablet    Sig: Take 1 tablet (500 mg total) by mouth 2 (two) times daily as needed. Take with food    Dispense:  30 tablet    Refill:  1   meclizine  (ANTIVERT ) 25 MG tablet    Sig: Take 1 tablet (25 mg total) by mouth 3 (three) times daily as needed for dizziness.    Dispense:  30 tablet    Refill:  0   predniSONE  (DELTASONE ) 20 MG tablet    Sig: Take 1 tablet (20 mg total) by mouth 2 (two) times daily with a meal for 5 days.    Dispense:  10 tablet    Refill:  0   methylPREDNISolone  acetate (DEPO-MEDROL ) injection 40 mg    No follow-ups on file.        [1]  Allergies Allergen Reactions   Penicillins Other (See Comments) and Rash    Chest tightness   Wellbutrin  [Bupropion ]     nosebleeds

## 2024-11-30 ENCOUNTER — Encounter: Payer: Self-pay | Admitting: Family Medicine

## 2024-11-30 ENCOUNTER — Telehealth: Payer: Self-pay

## 2024-11-30 NOTE — Telephone Encounter (Signed)
 Copied from CRM #[id number]. Topic: General - Other >> Nov 30, 2024  1:46 PM Anna Robinson wrote: Reason for CRM: Called out for night job tonight, asking for doctors  note to be emailed to [email or web address] or send to mychart

## 2024-12-21 ENCOUNTER — Telehealth: Payer: Self-pay | Admitting: Adult Health

## 2024-12-21 NOTE — Telephone Encounter (Signed)
Updated appt note

## 2024-12-21 NOTE — Telephone Encounter (Signed)
 [facility] called to Scheduled Pt Botox  delivery   Botox  delivery  Date  12-22-2024 Quantity of 1  200 units 84 day supply

## 2025-01-02 ENCOUNTER — Ambulatory Visit: Admitting: Adult Health

## 2025-01-02 ENCOUNTER — Encounter: Payer: Self-pay | Admitting: Adult Health

## 2025-01-02 VITALS — BP 104/65 | HR 62

## 2025-01-02 DIAGNOSIS — G43709 Chronic migraine without aura, not intractable, without status migrainosus: Secondary | ICD-10-CM

## 2025-01-02 MED ORDER — ONABOTULINUMTOXINA 200 UNITS IJ SOLR
155.0000 [IU] | Freq: Once | INTRAMUSCULAR | Status: AC
  Administered 2025-01-02: 155 [IU] via INTRAMUSCULAR

## 2025-01-02 NOTE — Progress Notes (Signed)
 "    Update 01/02/2025 JM: Returns for repeat Botox  injection.  Prior injection 10/04/2024.  Reports great benefit with botox  injections with >50% migraine reduction and further improvement on Vyepti  300mg  infusion every 3 months. She was not experiencing any migraine headaches up until 1 month ago when she was found to have an ear infection also associated with vertigo. Advised suspect worsening migraines likely combination of ear infection, frequent weather changes and almost being due for Botox  treatment. Hopefully migraines will start to improve after todays injection but advised to call if they persist. Continue Ubrelvy  as needed.  Tolerated procedure well today.  Return in 3 months for repeat injection.        Consent Form Botulism Toxin Injection For Chronic Migraine    Reviewed orally with patient, additionally signature is on file:  Botulism toxin has been approved by the Federal drug administration for treatment of chronic migraine. Botulism toxin does not cure chronic migraine and it may not be effective in some patients.  The administration of botulism toxin is accomplished by injecting a small amount of toxin into the muscles of the neck and head. Dosage must be titrated for each individual. Any benefits resulting from botulism toxin tend to wear off after 3 months with a repeat injection required if benefit is to be maintained. Injections are usually done every 3-4 months with maximum effect peak achieved by about 2 or 3 weeks. Botulism toxin is expensive and you should be sure of what costs you will incur resulting from the injection.  The side effects of botulism toxin use for chronic migraine may include:   -Transient, and usually mild, facial weakness with facial injections  -Transient, and usually mild, head or neck weakness with head/neck injections  -Reduction or loss of forehead facial animation due to forehead muscle weakness  -Eyelid drooping  -Dry eye  -Pain at  the site of injection or bruising at the site of injection  -Double vision  -Potential unknown long term risks   Contraindications: You should not have Botox  if you are pregnant, nursing, allergic to albumin, have an infection, skin condition, or muscle weakness at the site of the injection, or have myasthenia gravis, Lambert-Eaton syndrome, or ALS.  It is also possible that as with any injection, there may be an allergic reaction or no effect from the medication. Reduced effectiveness after repeated injections is sometimes seen and rarely infection at the injection site may occur. All care will be taken to prevent these side effects. If therapy is given over a long time, atrophy and wasting in the muscle injected may occur. Occasionally the patient's become refractory to treatment because they develop antibodies to the toxin. In this event, therapy needs to be modified.  I have read the above information and consent to the administration of botulism toxin.    BOTOX  PROCEDURE NOTE FOR MIGRAINE HEADACHE  Contraindications and precautions discussed with patient(above). Aseptic procedure was observed and patient tolerated procedure. Procedure performed by Harlene Bogaert, AGNP-BC.   The condition has existed for more than 6 months, and pt does not have a diagnosis of ALS, Myasthenia Gravis or Lambert-Eaton Syndrome.  Risks and benefits of injections discussed and pt agrees to proceed with the procedure.  Written consent obtained  These injections are medically necessary. Pt  receives good benefits from these injections. These injections do not cause sedations or hallucinations which the oral therapies may cause.   Description of procedure:  The patient was placed in a sitting position.  The standard protocol was used for Botox  as follows, with 5 units of Botox  injected at each site:  -Procerus muscle, midline injection  -Corrugator muscle, bilateral injection  -Frontalis muscle, bilateral  injection, with 2 sites each side, medial injection was performed in the upper one third of the frontalis muscle, in the region vertical from the medial inferior edge of the superior orbital rim. The lateral injection was again in the upper one third of the forehead vertically above the lateral limbus of the cornea, 1.5 cm lateral to the medial injection site.  -Temporalis muscle injection, 4 sites, bilaterally. The first injection was 3 cm above the tragus of the ear, second injection site was 1.5 cm to 3 cm up from the first injection site in line with the tragus of the ear. The third injection site was 1.5-3 cm forward between the first 2 injection sites. The fourth injection site was 1.5 cm posterior to the second injection site. 5th site laterally in the temporalis  muscleat the level of the outer canthus.  -Occipitalis muscle injection, 3 sites, bilaterally. The first injection was done one half way between the occipital protuberance and the tip of the mastoid process behind the ear. The second injection site was done lateral and superior to the first, 1 fingerbreadth from the first injection. The third injection site was 1 fingerbreadth superiorly and medially from the first injection site.  -Cervical paraspinal muscle injection, 2 sites, bilaterally. The first injection site was 1 cm from the midline of the cervical spine, 3 cm inferior to the lower border of the occipital protuberance. The second injection site was 1.5 cm superiorly and laterally to the first injection site.  -Trapezius muscle injection was performed at 3 sites, bilaterally. The first injection site was in the upper trapezius muscle halfway between the inflection point of the neck, and the acromion. The second injection site was one half way between the acromion and the first injection site. The third injection was done between the first injection site and the inflection point of the neck.    A total of 200 units of Botox  was  prepared, 155 units of Botox  was injected as documented above, any Botox  not injected was wasted. The patient tolerated the procedure well, there were no complications of the above procedure.   Harlene Bogaert, AGNP-BC  [facility] [address] [place], [place] [address]  Phone [phone] Fax [phone] Note: This document was prepared with digital dictation and possible smart phrase technology. Any transcriptional errors that result from this process are unintentional.    "

## 2025-01-02 NOTE — Progress Notes (Signed)
 Botox - 200 units x 1 vial Lot: [id number] Expiration: 3/28 NDC: 9976-6078-97  Bacteriostatic 0.9% Sodium Chloride - 4 mL  Lot: [id number] Expiration: 10/14/25 NDC: 9590-8033-97  Dx: H56.290 S/P  Witnessed by Nena, RN

## 2025-01-17 ENCOUNTER — Encounter: Payer: Self-pay | Admitting: Family Medicine

## 2025-01-17 ENCOUNTER — Ambulatory Visit: Admitting: Family Medicine

## 2025-01-17 VITALS — BP 134/74 | HR 75 | Ht 65.0 in | Wt 164.0 lb

## 2025-01-17 DIAGNOSIS — H6991 Unspecified Eustachian tube disorder, right ear: Secondary | ICD-10-CM | POA: Diagnosis not present

## 2025-01-17 DIAGNOSIS — H9201 Otalgia, right ear: Secondary | ICD-10-CM | POA: Diagnosis not present

## 2025-01-17 MED ORDER — PREDNISONE 20 MG PO TABS: 40.0000 mg | ORAL_TABLET | Freq: Every day | ORAL | 0 refills | Status: AC

## 2025-01-17 MED ORDER — METHYLPREDNISOLONE ACETATE 40 MG/ML IJ SUSP
40.0000 mg | Freq: Once | INTRAMUSCULAR | Status: AC
  Administered 2025-01-17: 40 mg via INTRAMUSCULAR

## 2025-04-03 ENCOUNTER — Ambulatory Visit: Admitting: Adult Health
